# Patient Record
Sex: Female | Born: 1940 | Race: White | Hispanic: No | Smoking: Former smoker
Health system: Southern US, Community
[De-identification: ages and names within clinical notes are randomized; demographics above are authoritative.]

## PROBLEM LIST (undated history)

## (undated) DIAGNOSIS — I4891 Unspecified atrial fibrillation: Secondary | ICD-10-CM

## (undated) DIAGNOSIS — E785 Hyperlipidemia, unspecified: Secondary | ICD-10-CM

## (undated) DIAGNOSIS — C519 Malignant neoplasm of vulva, unspecified: Secondary | ICD-10-CM

## (undated) DIAGNOSIS — F32A Depression, unspecified: Secondary | ICD-10-CM

## (undated) DIAGNOSIS — K5792 Diverticulitis of intestine, part unspecified, without perforation or abscess without bleeding: Secondary | ICD-10-CM

## (undated) DIAGNOSIS — I10 Essential (primary) hypertension: Secondary | ICD-10-CM

## (undated) DIAGNOSIS — F329 Major depressive disorder, single episode, unspecified: Secondary | ICD-10-CM

## (undated) DIAGNOSIS — C801 Malignant (primary) neoplasm, unspecified: Secondary | ICD-10-CM

## (undated) HISTORY — DX: Major depressive disorder, single episode, unspecified: F32.9

## (undated) HISTORY — PX: TONSILLECTOMY: SUR1361

## (undated) HISTORY — DX: Hyperlipidemia, unspecified: E78.5

## (undated) HISTORY — DX: Malignant neoplasm of vulva, unspecified: C51.9

## (undated) HISTORY — DX: Depression, unspecified: F32.A

## (undated) HISTORY — PX: VULVA SURGERY: SHX837

## (undated) HISTORY — PX: CATARACT EXTRACTION, BILATERAL: SHX1313

## (undated) HISTORY — DX: Unspecified atrial fibrillation: I48.91

## (undated) HISTORY — DX: Malignant (primary) neoplasm, unspecified: C80.1

## (undated) HISTORY — PX: KNEE SURGERY: SHX244

## (undated) HISTORY — PX: ABDOMINAL HYSTERECTOMY: SHX81

## (undated) HISTORY — DX: Essential (primary) hypertension: I10

## (undated) HISTORY — DX: Diverticulitis of intestine, part unspecified, without perforation or abscess without bleeding: K57.92

---

## 2018-08-29 ENCOUNTER — Telehealth: Payer: Self-pay | Admitting: *Deleted

## 2018-08-29 ENCOUNTER — Encounter: Payer: Self-pay | Admitting: Oncology

## 2018-08-29 ENCOUNTER — Encounter (INDEPENDENT_AMBULATORY_CARE_PROVIDER_SITE_OTHER): Payer: Self-pay

## 2018-08-29 ENCOUNTER — Other Ambulatory Visit: Payer: Self-pay

## 2018-08-29 ENCOUNTER — Inpatient Hospital Stay: Payer: Medicare Other | Attending: Oncology | Admitting: Oncology

## 2018-08-29 ENCOUNTER — Inpatient Hospital Stay: Payer: Medicare Other

## 2018-08-29 VITALS — BP 94/54 | HR 60 | Temp 97.6°F | Resp 16 | Ht 62.5 in | Wt 111.2 lb

## 2018-08-29 DIAGNOSIS — N939 Abnormal uterine and vaginal bleeding, unspecified: Secondary | ICD-10-CM

## 2018-08-29 DIAGNOSIS — I4891 Unspecified atrial fibrillation: Secondary | ICD-10-CM

## 2018-08-29 DIAGNOSIS — I480 Paroxysmal atrial fibrillation: Secondary | ICD-10-CM | POA: Insufficient documentation

## 2018-08-29 DIAGNOSIS — Z923 Personal history of irradiation: Secondary | ICD-10-CM | POA: Insufficient documentation

## 2018-08-29 DIAGNOSIS — C519 Malignant neoplasm of vulva, unspecified: Secondary | ICD-10-CM | POA: Diagnosis not present

## 2018-08-29 DIAGNOSIS — K5900 Constipation, unspecified: Secondary | ICD-10-CM | POA: Insufficient documentation

## 2018-08-29 DIAGNOSIS — G893 Neoplasm related pain (acute) (chronic): Secondary | ICD-10-CM | POA: Insufficient documentation

## 2018-08-29 DIAGNOSIS — Z79899 Other long term (current) drug therapy: Secondary | ICD-10-CM | POA: Diagnosis not present

## 2018-08-29 DIAGNOSIS — E785 Hyperlipidemia, unspecified: Secondary | ICD-10-CM | POA: Insufficient documentation

## 2018-08-29 DIAGNOSIS — Z7901 Long term (current) use of anticoagulants: Secondary | ICD-10-CM

## 2018-08-29 DIAGNOSIS — Z87891 Personal history of nicotine dependence: Secondary | ICD-10-CM | POA: Diagnosis not present

## 2018-08-29 DIAGNOSIS — I10 Essential (primary) hypertension: Secondary | ICD-10-CM | POA: Diagnosis not present

## 2018-08-29 DIAGNOSIS — N898 Other specified noninflammatory disorders of vagina: Secondary | ICD-10-CM | POA: Diagnosis not present

## 2018-08-29 LAB — CBC WITH DIFFERENTIAL/PLATELET
BASOS ABS: 0.3 10*3/uL — AB (ref 0–0.1)
BASOS PCT: 3 %
EOS ABS: 0.1 10*3/uL (ref 0–0.7)
EOS PCT: 1 %
HCT: 37.1 % (ref 35.0–47.0)
Hemoglobin: 12.1 g/dL (ref 12.0–16.0)
Lymphocytes Relative: 11 %
Lymphs Abs: 1.1 10*3/uL (ref 1.0–3.6)
MCH: 26.7 pg (ref 26.0–34.0)
MCHC: 32.5 g/dL (ref 32.0–36.0)
MCV: 82.2 fL (ref 80.0–100.0)
Monocytes Absolute: 0.5 10*3/uL (ref 0.2–0.9)
Monocytes Relative: 5 %
NEUTROS PCT: 80 %
Neutro Abs: 8.1 10*3/uL — ABNORMAL HIGH (ref 1.4–6.5)
PLATELETS: 257 10*3/uL (ref 150–440)
RBC: 4.51 MIL/uL (ref 3.80–5.20)
RDW: 15.7 % — ABNORMAL HIGH (ref 11.5–14.5)
WBC: 10.1 10*3/uL (ref 3.6–11.0)

## 2018-08-29 LAB — COMPREHENSIVE METABOLIC PANEL
ALK PHOS: 77 U/L (ref 38–126)
ALT: 14 U/L (ref 0–44)
AST: 21 U/L (ref 15–41)
Albumin: 3.2 g/dL — ABNORMAL LOW (ref 3.5–5.0)
Anion gap: 8 (ref 5–15)
BILIRUBIN TOTAL: 0.5 mg/dL (ref 0.3–1.2)
BUN: 24 mg/dL — ABNORMAL HIGH (ref 8–23)
CALCIUM: 9.4 mg/dL (ref 8.9–10.3)
CHLORIDE: 110 mmol/L (ref 98–111)
CO2: 25 mmol/L (ref 22–32)
CREATININE: 0.91 mg/dL (ref 0.44–1.00)
Glucose, Bld: 130 mg/dL — ABNORMAL HIGH (ref 70–99)
Potassium: 4.2 mmol/L (ref 3.5–5.1)
Sodium: 143 mmol/L (ref 135–145)
TOTAL PROTEIN: 7.4 g/dL (ref 6.5–8.1)

## 2018-08-29 LAB — SAMPLE TO BLOOD BANK

## 2018-08-29 LAB — PROTIME-INR
INR: 4.98
Prothrombin Time: 45.9 seconds — ABNORMAL HIGH (ref 11.4–15.2)

## 2018-08-29 MED ORDER — POLYETHYLENE GLYCOL 3350 17 G PO PACK: 17.0000 g | PACK | Freq: Every day | ORAL | 0 refills | Status: AC | PRN

## 2018-08-29 MED ORDER — TRAMADOL HCL 50 MG PO TABS: 50.0000 mg | ORAL_TABLET | Freq: Four times a day (QID) | ORAL | 0 refills | Status: DC | PRN

## 2018-08-29 MED ORDER — SENNA 8.6 MG PO TABS: 2.0000 | ORAL_TABLET | Freq: Every day | ORAL | 0 refills | Status: DC

## 2018-08-29 MED ORDER — WARFARIN SODIUM 1 MG PO TABS: 1.0000 mg | ORAL_TABLET | Freq: Every day | ORAL | 0 refills | Status: DC

## 2018-08-29 NOTE — Progress Notes (Signed)
Patient here today as a new patient with vulvar cancer.  Patient c/o sores around the vulvar, urinary inconstancy and constipation. Patient is very uncomfortable sitting in the exam room on a pillow in the chair.

## 2018-08-29 NOTE — Telephone Encounter (Signed)
CRITICAL INR:  4.98  Notified Dr Tasia Catchings. Contacted pt to see if she has taken today, Per patient she has taken today   Per Dr Tasia Catchings, patient is to hold coumadin tomorrow and Sunday, take 2mg  on Monday, lab on Tuesday.    Notified patient of directions and scheduled her a lab appt for Tuesday.   New Rx for 1mg  coumadin sent to pharmacy.

## 2018-08-29 NOTE — Progress Notes (Signed)
Hematology/Oncology Consult note [facility] [phone] [phone]   Patient Care Team: Patient, No Pcp Per as PCP - General (General Practice)  REFERRING PROVIDER: [place] [facility].  CHIEF COMPLAINTS/REASON FOR VISIT:  Evaluation of vulvar cancer  HISTORY OF PRESENTING ILLNESS:  Virginia Murray is a  77 y.o.  female with PMH listed below who was referred to me for evaluation of vulvar cancer.  Patient recently relocated from [place] to [place]. Reviewed medical records sent to [place] from [place] cancer in the blood specialist Dr. Marney Doctor, who is patient's previous oncologist. 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  She completed radiation to vulva and the right groin in August 2007 at [facility].  Since then she was on surveillance.  She was advised to have follow-up but she had to postpone as she was taking care of her husband was quite ill and unfortunately passed away On 2018/08/13, patient was found to have a 7 cm ulcerated lesion in the left vulva partially involving the vaginal and rectum on examination.  The lesion was biopsied by Dr. Tessa Lerner on 07/29/2018, PET scan showed ulcerated perineal mass 8.7 x 4.2 x 5.3 centimeters, SUV 12, extending into the region of gluteal cleft.  Left inguinal lymph nodes measured [1.1 x 0.7 cm]  SUV 1.6.  Which appeared nonspecific possibly metastatic or reactive. There was no evidence of distant metastasis. Given prior radiation treatment, she was advised by Dr. Tessa Lerner for chemotherapy.  She met Dr.Christi Maudie Mercury for discussion of treatment options.  She was recommended to start dose reduced carboplatin and Taxol.  Also NexGen sequencing PDL 1 testing was discussed, unknown if testing were sent.  Patient did not start treatment as she is  relocating to [place] to live with patient's son and wants to establish care with [facility] cancer center.  Today patient reports moderate discomfort and pain in the vulvar area.  She also has some vaginal discharge and wear pads. Reports bleeding from her private area.  Denies any symptoms of voiding trouble, dysuria or incontinence. Reports constipation.  Past medical history includes paroxysmal atrial fibrillation and take coumadin 67m daily. She has not established care with PCP locally. No INR has been checked recently.   She had labs [facility]. BUN 21.3, creatinine 1.81, sodium 143, potassium 4, chloride 103, calcium 9.4, total protein 6.9, albumin 3.4, alkaline phosphatase 99 total bili 8.3, ALT 13 AST 18, iron 36, TIBC 241, iron saturation 14.9, magnesium 1.5, phosphorus 3.3, LDH 166, uric acid 5.8.  Folate 18.9, ferritin 84, B12 362, CEA 1.6. WBC 9.1, hemoglobin 12.8, hematocrit 40.5, MCV 83.9, platelet counts 200,000, normal differential except slightly high monocyte 0.7[normal ref0.1- 0.6]  Review of Systems  Constitutional: Negative for chills, fever, malaise/fatigue and weight loss.  HENT: Negative for nosebleeds and sore throat.   Eyes: Negative for double vision, photophobia and redness.  Respiratory: Negative for cough, shortness of breath and wheezing.   Cardiovascular: Negative for chest pain, palpitations and orthopnea.  Gastrointestinal: Positive for constipation. Negative for abdominal pain, blood in stool, nausea and vomiting.  Genitourinary: Negative for dysuria.       Soreness/pain around Vulva mass, spotting blood.  Musculoskeletal: Negative for back pain, myalgias and neck pain.  Skin: Negative for itching and rash.  Neurological: Negative for dizziness, tingling and tremors.  Endo/Heme/Allergies: Negative for environmental allergies. Does not bruise/bleed easily.  Psychiatric/Behavioral: Negative for depression.     MEDICAL HISTORY:  Past Medical History:  Diagnosis Date  . A-fib ([place])   . Cancer (HCC)    vulva   . Depression   . Diverticulitis   . Hyperlipemia   . Hypertension   . Vulva cancer [facility])     SURGICAL HISTORY: Past Surgical History:  Procedure Laterality Date  . ABDOMINAL HYSTERECTOMY    . CATARACT EXTRACTION, BILATERAL    . KNEE SURGERY     right knee   . TONSILLECTOMY    . VULVA SURGERY      SOCIAL HISTORY: Social History   Socioeconomic History  . Marital status: Widowed    Spouse name: Not on file  . Number of children: 2  . Years of education: Not on file  . Highest education level: Not on file  Occupational History  . Not on file  Social Needs  . Financial resource strain: Not on file  . Food insecurity:    Worry: Not on file    Inability: Not on file  . Transportation needs:    Medical: Not on file    Non-medical: Not on file  Tobacco Use  . Smoking status: Former Smoker    Packs/day: 1.00    Years: 20.00    Pack years: 20.00    Types: Cigarettes    Last attempt to quit: 08/29/1993    Years since quitting: 25.0  . Smokeless tobacco: Never Used  Substance and Sexual Activity  . Alcohol use: Not Currently  . Drug use: Never  . Sexual activity: Not Currently  Lifestyle  . Physical activity:    Days per week: Not on file    Minutes per session: Not on file  . Stress: Not on file  Relationships  . Social connections:    Talks on phone: Not on file    Gets together: Not on file    Attends religious service: Not on file    Active member of club or organization: Not on file    Attends meetings of clubs or organizations: Not on file    Relationship status: Not on file  . Intimate partner violence:    Fear of current or ex partner: Not on file    Emotionally abused: Not on file    Physically abused: Not on file    Forced sexual activity: Not on file  Other Topics Concern  . Not on file  Social History Narrative  . Not on file    FAMILY  HISTORY: Family History  Problem Relation Age of Onset  . Alzheimer's disease Mother   . Heart disease Father     ALLERGIES:  has No Known Allergies.  MEDICATIONS:  Current Outpatient Medications  Medication Sig Dispense Refill  . amLODipine (NORVASC) 2.5 MG tablet Take 2.5 mg by mouth daily.    Marland Kitchen atenolol (TENORMIN) 100 MG tablet Take 100 mg by mouth daily.    . benazepril (LOTENSIN) 40 MG tablet Take 40 mg by mouth daily.    Marland Kitchen doxazosin (CARDURA) 8 MG tablet Take 8 mg by mouth daily.    . pravastatin (PRAVACHOL) 40 MG tablet Take 40 mg by mouth daily.    Marland Kitchen warfarin (COUMADIN) 3 MG tablet Take 3 mg by mouth daily.     No current facility-administered medications for this visit.      PHYSICAL EXAMINATION: ECOG PERFORMANCE STATUS: 2 - Symptomatic, <50% confined to bed  Vitals:   08/29/18 1145  BP: (!) 94/54  Pulse: 60  Resp: 16  Temp: 97.6 F (36.4 C)   Filed Weights   08/29/18 1145  Weight: 111 lb 4 oz (50.5 kg)    Physical Exam  Constitutional: She is oriented to person, place, and time.  Frail appearance elderly female, mild distress sitting on pillow  HENT:  Head: Normocephalic and atraumatic.  Mouth/Throat: Oropharynx is clear and moist.  Eyes: Pupils are equal, round, and reactive to light. EOM are normal. No scleral icterus.  Neck: Normal range of motion. Neck supple.  Cardiovascular: Normal rate.  Pulmonary/Chest: Effort normal. No respiratory distress. She has no wheezes.  Abdominal: Soft. Bowel sounds are normal. She exhibits no distension.  Genitourinary: Vaginal discharge found.  Genitourinary Comments: Bloody vaginal discharge. Left vulvar ulcerated mass extending to the angina, foul-smelling odor  Musculoskeletal: Normal range of motion. She exhibits no edema or deformity.  Neurological: She is alert and oriented to person, place, and time. No cranial nerve deficit. Coordination normal.  Skin: Skin is warm and dry. No rash noted.  Psychiatric: She  has a normal mood and affect.     LABORATORY DATA:  I have reviewed the data as listed Lab Results  Component Value Date   WBC 10.1 08/29/2018   HGB 12.1 08/29/2018   HCT 37.1 08/29/2018   MCV 82.2 08/29/2018   PLT 257 08/29/2018   No results for input(s): NA, K, CL, CO2, GLUCOSE, BUN, CREATININE, CALCIUM, GFRNONAA, GFRAA, PROT, ALBUMIN, AST, ALT, ALKPHOS, BILITOT, BILIDIR, IBILI in the last 8760 hours. Iron/TIBC/Ferritin/ %Sat No results found for: IRON, TIBC, FERRITIN, IRONPCTSAT      ASSESSMENT & PLAN:  1. Vulvar cancer ([place])   2. Chronic anticoagulation   3. Atrial fibrillation, unspecified type ([place])   4. Vaginal discharge, bloody    The diagnosis and care plan were discussed with patient in detail.   Will obtain PET scan image, pathology slides to be reviewed by our pathologist.  Recommend systemic Carboplatin and Taxol, will dose reduce given patient's age and performance status.  We discussed with patient and her family members and they understand that it may take 1 to 2 weeks before we start chemotherapy.  We will start supportive care to decrease her cancer symptoms.  The goal of treatment which is to palliate disease, disease related symptoms, improve quality of life and hopefully prolong life was highlighted in our discussion.  We had discussed the composition of chemotherapy regimen, length of chemo cycle, duration of treatment and the time to assess response to treatment.  Supportive care measures are necessary for patient well-being and will be provided as necessary.  I explained to the patient the risks and benefits of chemotherapy including all but not limited to hair loss, mouth sore, nausea, vomiting, low blood counts, bleeding, neuropathy and risk of life threatening infection and even death, secondary malignancy etc.  Patient voices understanding and willing to proceed chemotherapy.   # Chemotherapy education, refer to vascular surgery for medi port placement.  Hopefully the planned start chemotherapy next week. Antiemetics-Zofran and Compazine; EMLA cream sent to pharmacy   #Atrial fibrillation on chronic anticoagulation with Coumadin.  Reports that she has been on Coumadin for about a year. She follows up with cardiology in [place] has not establish local care.  We will check INR today. Refer to cardiology to establish care and evaluation of if she needs chronic anticoagulation or if it is okay to be switched to aspirin  given that she may become thrombocytopenia and have increased bleeding risk.  #Referred to primary care physician to establish care. #Neoplasm related pain: Patient has tried topical palliative care without symptom relief.  Will start patient on tramadol 50 mg every 6 hours as needed.  Pain regimen can be further titrated up according to her pain control. #Refer to GYN oncology to establish care.  #Constipation, she takes Colace OTC without symptom relief.  Advised patient to stop Colace.  I sent prescription of senna 17.6 mg daily with as needed MiraLAX.   We spent sufficient time to discuss many aspect of care, questions were answered to patient's satisfaction.  Orders Placed This Encounter  Procedures  . Protime-INR    Standing Status:   Future    Standing Expiration Date:   08/30/2019  . CBC with Differential/Platelet    Standing Status:   Future    Standing Expiration Date:   08/30/2019  . Comprehensive metabolic panel    Standing Status:   Future    Standing Expiration Date:   08/30/2019    All questions were answered. The patient knows to call the clinic with any problems questions or concerns.  Return of visit: 1 week for assessment of symptom controls and discuss future plan.  Thank you for this kind referral and the opportunity to participate in the care of this patient. A copy of today's note is routed to referring provider  Total face to face encounter time for this patient visit was 60 min. >50% of the time was   spent in counseling and coordination of care.    Earlie Server, MD, PhD Hematology Oncology [facility] at [facility] Pager- [phone] 08/29/2018

## 2018-09-02 ENCOUNTER — Inpatient Hospital Stay: Payer: Medicare Other | Attending: Oncology

## 2018-09-02 ENCOUNTER — Other Ambulatory Visit: Payer: Self-pay

## 2018-09-02 ENCOUNTER — Telehealth: Payer: Self-pay | Admitting: *Deleted

## 2018-09-02 DIAGNOSIS — C519 Malignant neoplasm of vulva, unspecified: Secondary | ICD-10-CM | POA: Insufficient documentation

## 2018-09-02 DIAGNOSIS — Z923 Personal history of irradiation: Secondary | ICD-10-CM | POA: Diagnosis not present

## 2018-09-02 DIAGNOSIS — Z90710 Acquired absence of both cervix and uterus: Secondary | ICD-10-CM | POA: Diagnosis not present

## 2018-09-02 DIAGNOSIS — Z87891 Personal history of nicotine dependence: Secondary | ICD-10-CM | POA: Diagnosis not present

## 2018-09-02 DIAGNOSIS — E8809 Other disorders of plasma-protein metabolism, not elsewhere classified: Secondary | ICD-10-CM | POA: Insufficient documentation

## 2018-09-02 DIAGNOSIS — Z7901 Long term (current) use of anticoagulants: Secondary | ICD-10-CM

## 2018-09-02 LAB — PROTIME-INR
INR: 1.82
PROTHROMBIN TIME: 20.9 s — AB (ref 11.4–15.2)

## 2018-09-02 NOTE — Telephone Encounter (Signed)
Per Dr Tasia Catchings, based off of INR result today, advise patient to continue 2 mg coumadin, we will see patient tomorrow in clinic as already scheduled.    Called daughter in law, Sharyn Lull, notify her of INR results and to continue 2mg ,  Sharyn Lull verbalized understanding

## 2018-09-03 ENCOUNTER — Inpatient Hospital Stay (HOSPITAL_BASED_OUTPATIENT_CLINIC_OR_DEPARTMENT_OTHER): Payer: Medicare Other | Admitting: Oncology

## 2018-09-03 ENCOUNTER — Telehealth (INDEPENDENT_AMBULATORY_CARE_PROVIDER_SITE_OTHER): Payer: Self-pay

## 2018-09-03 ENCOUNTER — Other Ambulatory Visit: Payer: Medicare Other

## 2018-09-03 ENCOUNTER — Other Ambulatory Visit (INDEPENDENT_AMBULATORY_CARE_PROVIDER_SITE_OTHER): Payer: Self-pay | Admitting: Nurse Practitioner

## 2018-09-03 ENCOUNTER — Other Ambulatory Visit: Payer: Self-pay

## 2018-09-03 ENCOUNTER — Encounter: Payer: Self-pay | Admitting: Oncology

## 2018-09-03 ENCOUNTER — Ambulatory Visit: Payer: Medicare Other

## 2018-09-03 ENCOUNTER — Inpatient Hospital Stay (HOSPITAL_BASED_OUTPATIENT_CLINIC_OR_DEPARTMENT_OTHER): Payer: Medicare Other | Admitting: Obstetrics and Gynecology

## 2018-09-03 VITALS — BP 127/66 | HR 52 | Temp 98.2°F | Wt 109.0 lb

## 2018-09-03 VITALS — BP 127/66 | HR 52 | Temp 98.2°F | Resp 18 | Ht 62.5 in | Wt 109.0 lb

## 2018-09-03 DIAGNOSIS — K5900 Constipation, unspecified: Secondary | ICD-10-CM

## 2018-09-03 DIAGNOSIS — I4891 Unspecified atrial fibrillation: Secondary | ICD-10-CM

## 2018-09-03 DIAGNOSIS — Z87891 Personal history of nicotine dependence: Secondary | ICD-10-CM | POA: Diagnosis not present

## 2018-09-03 DIAGNOSIS — Z90710 Acquired absence of both cervix and uterus: Secondary | ICD-10-CM | POA: Diagnosis not present

## 2018-09-03 DIAGNOSIS — Z7901 Long term (current) use of anticoagulants: Secondary | ICD-10-CM | POA: Diagnosis not present

## 2018-09-03 DIAGNOSIS — Z923 Personal history of irradiation: Secondary | ICD-10-CM | POA: Diagnosis not present

## 2018-09-03 DIAGNOSIS — N898 Other specified noninflammatory disorders of vagina: Secondary | ICD-10-CM

## 2018-09-03 DIAGNOSIS — K5909 Other constipation: Secondary | ICD-10-CM

## 2018-09-03 DIAGNOSIS — G893 Neoplasm related pain (acute) (chronic): Secondary | ICD-10-CM | POA: Diagnosis not present

## 2018-09-03 DIAGNOSIS — E8809 Other disorders of plasma-protein metabolism, not elsewhere classified: Secondary | ICD-10-CM

## 2018-09-03 DIAGNOSIS — C519 Malignant neoplasm of vulva, unspecified: Secondary | ICD-10-CM | POA: Diagnosis not present

## 2018-09-03 DIAGNOSIS — Z7189 Other specified counseling: Secondary | ICD-10-CM

## 2018-09-03 MED ORDER — OXYCODONE HCL 5 MG PO TABS: 5.0000 mg | ORAL_TABLET | ORAL | 0 refills | Status: DC | PRN

## 2018-09-03 MED ORDER — DEXAMETHASONE 4 MG PO TABS: 8.0000 mg | ORAL_TABLET | Freq: Every day | ORAL | 1 refills | Status: AC

## 2018-09-03 MED ORDER — PROCHLORPERAZINE MALEATE 10 MG PO TABS: 10.0000 mg | ORAL_TABLET | Freq: Four times a day (QID) | ORAL | 1 refills | Status: AC | PRN

## 2018-09-03 MED ORDER — LIDOCAINE-PRILOCAINE 2.5-2.5 % EX CREA: TOPICAL_CREAM | CUTANEOUS | 3 refills | Status: AC

## 2018-09-03 MED ORDER — ONDANSETRON HCL 8 MG PO TABS: 8.0000 mg | ORAL_TABLET | Freq: Two times a day (BID) | ORAL | 1 refills | Status: AC | PRN

## 2018-09-03 NOTE — Progress Notes (Signed)
Gynecologic Oncology Consult Visit   Referring Provider: Earlie Server, MD  Chief Concern: recurrent vulvar cancer  Subjective:  Virginia Murray is a 77 y.o. female who is seen in consultation from Dr. Tasia Catchings for recurrent vulvar cancer. Her gynecologic history is complicated.   She recently relocated from [place] to [place] and saw Dr. Tasia Catchings. Her prior oncologist in [place] was Dr. Marney Doctor.   Per medical records, Virginia Murray was initially diagnosed with vulvar cancer in 2007.   -2007 status post radical vulvectomy, bilateral inguinal lymphadenectomy followed by adjuvant radiation to vulva and the right groin in August 2007 at [facility].    She was on surveillance, but due to family illness it sounds like she was lost to followup. She was taking care of her husband was quite ill and unfortunately passed away.   -Aug 22, 2018, patient was found to have a 7 cm ulcerated lesion in the left vulva partially involving the vaginal and rectum on examination.  The lesion was biopsied by Dr. Tessa Lerner on 07/29/2018,   PET scan showed ulcerated perineal mass 8.7 x 4.2 x 5.3 centimeters, SUV 12, extending into the region of gluteal cleft.  Left inguinal lymph nodes measured [1.1 x 0.7 cm]  SUV 1.6.  Which appeared nonspecific possibly metastatic or reactive. There was no evidence of distant metastasis.  Given prior radiation treatment, she was advised by Dr. Tessa Lerner for chemotherapy.  She met Dr.Christi Maudie Mercury for discussion of treatment options.  She was recommended to start dose reduced carboplatin and Taxol. Patient did not start treatment as she is relocating to [place] to live with patient's son and wants to establish care with [facility].  Tumor testing: NexGen sequencing PDL 1 testing was discussed, unknown if testing were sent.    Today patient reports significant discomfort and pain in her vulvar area rating pain 10/10. She has started Tramadol per Dr. Tasia Catchings which  provides some improvement. She has some vaginal discharge and wears pads. She has some bleeding and constipation. She has pain with bowel movements but reports control of her bowel movements. She reports that she had 'closure' of her vagina after initial radiation treatments. She reports significant discomfort in sitting. Denies voiding problems or dysuria.   Dr. Tasia Catchings checked her PT/INR which was elevated to 45.9/4.98 patient is to hold coumadin tomorrow and Sunday, take 37m on Monday. Repeat lab 20.9/1.833 Dr. YTasia Catchingsis scheduled to see her today.   Problem List: Patient Active Problem List   Diagnosis Date Noted  . [place] cancer [place] 09/03/2018    Past Medical History: Past Medical History:  Diagnosis Date  . A-fib ([place]   . Cancer (HCC)    vulva   . Depression   . Diverticulitis   . Hyperlipemia   . Hypertension   . Vulva cancer ([facility]     Past Surgical History: Past Surgical History:  Procedure Laterality Date  . ABDOMINAL HYSTERECTOMY    . CATARACT EXTRACTION, BILATERAL    . KNEE SURGERY     right knee   . TONSILLECTOMY    . VULVA SURGERY      Past Gynecologic History: as per HPI   OB History:  OB History  Gravida Para Term Preterm AB Living  2 2          SAB TAB Ectopic Multiple Live Births               # Outcome Date GA Lbr Len/2nd Weight Sex Delivery  Anes PTL Lv  2 Para           1 Para             Family History: None Family History  Problem Relation Age of Onset  . Alzheimer's disease Mother   . Heart disease Father     Social History: Social History   Socioeconomic History  . Marital status: Widowed    Spouse name: Not on file  . Number of children: 2  . Years of education: Not on file  . Highest education level: Not on file  Occupational History  . Not on file  Social Needs  . Financial resource strain: Not on file  . Food insecurity:    Worry: Not on file    Inability: Not on file  . Transportation needs:    Medical: Not on file     Non-medical: Not on file  Tobacco Use  . Smoking status: Former Smoker    Packs/day: 1.00    Years: 20.00    Pack years: 20.00    Types: Cigarettes    Last attempt to quit: 08/29/1993    Years since quitting: 25.0  . Smokeless tobacco: Never Used  Substance and Sexual Activity  . Alcohol use: Not Currently  . Drug use: Never  . Sexual activity: Not Currently  Lifestyle  . Physical activity:    Days per week: Not on file    Minutes per session: Not on file  . Stress: Not on file  Relationships  . Social connections:    Talks on phone: Not on file    Gets together: Not on file    Attends religious service: Not on file    Active member of club or organization: Not on file    Attends meetings of clubs or organizations: Not on file    Relationship status: Not on file  . Intimate partner violence:    Fear of current or ex partner: Not on file    Emotionally abused: Not on file    Physically abused: Not on file    Forced sexual activity: Not on file  Other Topics Concern  . Not on file  Social History Narrative  . Not on file    Allergies: No Known Allergies  Current Medications: Current Outpatient Medications  Medication Sig Dispense Refill  . amLODipine (NORVASC) 2.5 MG tablet Take 2.5 mg by mouth daily.    Marland Kitchen atenolol (TENORMIN) 100 MG tablet Take 100 mg by mouth daily.    . benazepril (LOTENSIN) 40 MG tablet Take 40 mg by mouth daily.    Marland Kitchen doxazosin (CARDURA) 8 MG tablet Take 8 mg by mouth daily.    . pravastatin (PRAVACHOL) 40 MG tablet Take 40 mg by mouth daily.    Marland Kitchen warfarin (COUMADIN) 3 MG tablet Take 2 mg by mouth daily.     . polyethylene glycol (MIRALAX) packet Take 17 g by mouth daily as needed for mild constipation or moderate constipation. (Patient not taking: Reported on 09/03/2018) 30 each 0  . senna (SENOKOT) 8.6 MG TABS tablet Take 2 tablets (17.2 mg total) by mouth daily. (Patient not taking: Reported on 09/03/2018) 120 each 0  . traMADol (ULTRAM) 50 MG  tablet Take 1 tablet (50 mg total) by mouth every 6 (six) hours as needed. (Patient not taking: Reported on 09/03/2018) 30 tablet 0  . warfarin (COUMADIN) 1 MG tablet Take 1 tablet (1 mg total) by mouth daily. (Patient not taking: Reported on 09/03/2018) 30  tablet 0   No current facility-administered medications for this visit.     Review of Systems General: no complaints  HEENT: ringing in the ears  Lungs: no complaints  Cardiac: no complaints  GI: no complaints  GU: vulvar pain and difficult sitting; urinary complaints and requires pads; she is able to have bowel movements.   Musculoskeletal: no complaints  Extremities: no complaints  Skin: no complaints  Neuro: no complaints  Endocrine: no complaints  Psych: no complaints       Objective:  Physical Examination:  BP 127/66 (BP Location: Right Arm, Patient Position: Sitting)   Pulse (!) 52   Temp 98.2 F (36.8 C) (Tympanic)   Resp 18   Ht 5' 2.5" (1.588 m)   Wt 109 lb (49.4 kg)   BMI 19.62 kg/m    ECOG Performance Status: 3 - Symptomatic, >50% confined to bed  General appearance: appears older than stated age, cachectic and no distress. Patient came into clinic in a wheel chair HEENT:PERRLA and extra ocular movement intact Lymph node survey: non-palpable, axillary, left inguinal, supraclavicular. Shotty adenopathy on the right, but no definitely enlarged nodes. Cardiovascular: irregular rate and rhythm Respiratory: normal air entry, lungs clear to auscultation Abdomen: soft, non-tender, without masses or organomegaly, no hernias and well healed incision Back: inspection of back is normal Extremities: extremities normal, atraumatic, no cyanosis or edema Neurological exam reveals alert, oriented, normal speech, no focal findings or movement disorder noted.  Pelvic: exam chaperoned by nurse;  Vulva: large vulvar lesion and ulceration in the perineum extensive with bilateral vulvar involvement. No recognizable anatomy. Exam  deferred to to pain.      Labs:    Chemistry      Component Value Date/Time   NA 143 08/29/2018 1253   K 4.2 08/29/2018 1253   CL 110 08/29/2018 1253   CO2 25 08/29/2018 1253   BUN 24 (H) 08/29/2018 1253   CREATININE 0.91 08/29/2018 1253      Component Value Date/Time   CALCIUM 9.4 08/29/2018 1253   ALKPHOS 77 08/29/2018 1253   AST 21 08/29/2018 1253   ALT 14 08/29/2018 1253   BILITOT 0.5 08/29/2018 1253      Albumin 3.2  Lab Results  Component Value Date   WBC 10.1 08/29/2018   HGB 12.1 08/29/2018   HCT 37.1 08/29/2018   MCV 82.2 08/29/2018   PLT 257 08/29/2018     Radiologic Imaging: As per HPI    Assessment:  Lennyn Gange is a 77 y.o. female diagnosed with recurrent  vulvar cancer s/p radical vulvectomy and bilateral inguinal lymphadenectomy followed by adjuvant radiation to vulva and the right groin in August 2007. Local recurrence with involvement of the vagina and rectum 2019.   Suboptimal pain control.   Poor performance status.   Hypoalbuminemia.   Medical co-morbidities complicating care: A-fib, hyperlipidemia, diverticulitis, diabetes and prior abdominal surgery (s/p hysterectomy) Body mass index is 19.62 kg/m.     Plan:   Problem List Items Addressed This Visit      Other   Vulvar cancer ([place]) - Primary      We discussed options for management. Unfortunately I do not think surgery is an option due to her performance status and I am concerned exenteration would be palliative only. I agree with plan for   Chemotherapy and she is scheduled to see Dr. Tasia Catchings to discuss platinum/paclitaxel chemotherapy. I do not think bevacizumab would be a optimal plan given bowel involvement and high risk for  fistulation. We also discussed pembrolizumab if she has MSI-H, dMMR, or PD-L1+ tumor.   I recommended continued good hygiene practice, optimization of pain control, and symptom management.   She is aware that prognosis is poor and treatment is not  curative. I defer goals of care discussion to Dr. Tasia Catchings.   Suggested return to clinic in  3-6 months or as needed.    The patient's diagnosis, an outline of the further diagnostic and laboratory studies which will be required, the recommendation, and alternatives were discussed.  All questions were answered to the patient's satisfaction.  A total of 60 minutes were spent with the patient/family today; >50% was spent in education, counseling and coordination of care for recurrent  vulvar cancer.    Gillis Ends, MD    CC:  Referring Provider: Earlie Server, MD

## 2018-09-03 NOTE — Progress Notes (Signed)
Patient c/o bleeding Friday / Monday. At this time bleeding has resolved. No pain noted with bleeding. Pain with sitting ( 10 +)

## 2018-09-03 NOTE — Telephone Encounter (Signed)
An attempt has been made to contact the patient yesterday and today to give her information regarding her port insertion scheduled for 09/04/18. Messages have been left for a return call.

## 2018-09-03 NOTE — Progress Notes (Signed)
START OFF PATHWAY REGIMEN - Other Dx   [id number]:Carboplatin + Paclitaxel (5/200) q21d:   A cycle is every 21 days:     Paclitaxel      Carboplatin   **Always confirm dose/schedule in your pharmacy ordering system**  Patient Characteristics: Intent of Therapy: Non-Curative / Palliative Intent, Discussed with Patient 

## 2018-09-04 ENCOUNTER — Ambulatory Visit
Admission: RE | Admit: 2018-09-04 | Discharge: 2018-09-04 | Disposition: A | Discharge status: Home / Self Care | Payer: Medicare Other | Source: Ambulatory Visit | Attending: Vascular Surgery | Admitting: Vascular Surgery

## 2018-09-04 ENCOUNTER — Encounter
Admission: RE | Disposition: A | Discharge status: Home / Self Care | Payer: Self-pay | Source: Ambulatory Visit | Attending: Vascular Surgery

## 2018-09-04 DIAGNOSIS — C519 Malignant neoplasm of vulva, unspecified: Secondary | ICD-10-CM

## 2018-09-04 HISTORY — PX: PORTA CATH INSERTION: CATH118285

## 2018-09-04 SURGERY — PORTA CATH INSERTION
Anesthesia: Moderate Sedation

## 2018-09-04 MED ORDER — CEFAZOLIN SODIUM-DEXTROSE 2-4 GM/100ML-% IV SOLN
2.0000 g | Freq: Once | INTRAVENOUS | Status: DC
  Filled 2018-09-04: qty 100

## 2018-09-04 MED ORDER — SODIUM CHLORIDE 0.9 % IV SOLN
INTRAVENOUS | Status: DC
  Administered 2018-09-04: 14:00:00 via INTRAVENOUS

## 2018-09-04 MED ORDER — SODIUM CHLORIDE 0.9 % IV SOLN
Freq: Once | INTRAVENOUS | Status: AC
  Administered 2018-09-04: 15:00:00
  Filled 2018-09-04: qty 80

## 2018-09-04 MED ORDER — HEPARIN (PORCINE) IN NACL 1000-0.9 UT/500ML-% IV SOLN
INTRAVENOUS | Status: AC
  Filled 2018-09-04: qty 500

## 2018-09-04 MED ORDER — FENTANYL CITRATE (PF) 100 MCG/2ML IJ SOLN
INTRAMUSCULAR | Status: AC
  Filled 2018-09-04: qty 2

## 2018-09-04 MED ORDER — HYDROMORPHONE HCL 1 MG/ML IJ SOLN: 1.0000 mg | Freq: Once | INTRAMUSCULAR | Status: DC | PRN

## 2018-09-04 MED ORDER — LIDOCAINE-EPINEPHRINE (PF) 1 %-1:200000 IJ SOLN
INTRAMUSCULAR | Status: AC
  Filled 2018-09-04: qty 30

## 2018-09-04 MED ORDER — MIDAZOLAM HCL 5 MG/5ML IJ SOLN
INTRAMUSCULAR | Status: AC
  Filled 2018-09-04: qty 5

## 2018-09-04 MED ORDER — DEXTROSE 5 % IV SOLN
2.0000 g | Freq: Once | INTRAVENOUS | Status: DC
  Administered 2018-09-04: 2 g via INTRAVENOUS
  Filled 2018-09-04: qty 20

## 2018-09-04 MED ORDER — MIDAZOLAM HCL 2 MG/2ML IJ SOLN
INTRAMUSCULAR | Status: DC | PRN
  Administered 2018-09-04: 2 mg via INTRAVENOUS

## 2018-09-04 MED ORDER — ONDANSETRON HCL 4 MG/2ML IJ SOLN: 4.0000 mg | Freq: Four times a day (QID) | INTRAMUSCULAR | Status: DC | PRN

## 2018-09-04 MED ORDER — FENTANYL CITRATE (PF) 100 MCG/2ML IJ SOLN
INTRAMUSCULAR | Status: DC | PRN
  Administered 2018-09-04: 50 ug via INTRAVENOUS

## 2018-09-04 SURGICAL SUPPLY — 8 items
KIT PORT POWER 8FR ISP CVUE (Port) ×3 IMPLANT
PACK ANGIOGRAPHY (CUSTOM PROCEDURE TRAY) ×3 IMPLANT
PAD GROUND ADULT SPLIT (MISCELLANEOUS) ×3 IMPLANT
PENCIL ELECTRO HAND CTR (MISCELLANEOUS) ×3 IMPLANT
SUT MNCRL AB 4-0 PS2 18 (SUTURE) ×3 IMPLANT
SUT PROLENE 0 CT 1 30 (SUTURE) ×3 IMPLANT
SUT VIC AB 3-0 SH 27 (SUTURE) ×2
SUT VIC AB 3-0 SH 27X BRD (SUTURE) ×1 IMPLANT

## 2018-09-04 NOTE — Discharge Instructions (Signed)
Moderate Conscious Sedation, Adult, Care After °These instructions provide you with information about caring for yourself after your procedure. Your health care provider may also give you more specific instructions. Your treatment has been planned according to current medical practices, but problems sometimes occur. Call your health care provider if you have any problems or questions after your procedure. °What can I expect after the procedure? °After your procedure, it is common: °· To feel sleepy for several hours. °· To feel clumsy and have poor balance for several hours. °· To have poor judgment for several hours. °· To vomit if you eat too soon. ° °Follow these instructions at home: °For at least 24 hours after the procedure: ° °· Do not: °? Participate in activities where you could fall or become injured. °? Drive. °? Use heavy machinery. °? Drink alcohol. °? Take sleeping pills or medicines that cause drowsiness. °? Make important decisions or sign legal documents. °? Take care of children on your own. °· Rest. °Eating and drinking °· Follow the diet recommended by your health care provider. °· If you vomit: °? Drink water, juice, or soup when you can drink without vomiting. °? Make sure you have little or no nausea before eating solid foods. °General instructions °· Have a responsible adult stay with you until you are awake and alert. °· Take over-the-counter and prescription medicines only as told by your health care provider. °· If you smoke, do not smoke without supervision. °· Keep all follow-up visits as told by your health care provider. This is important. °Contact a health care provider if: °· You keep feeling nauseous or you keep vomiting. °· You feel light-headed. °· You develop a rash. °· You have a fever. °Get help right away if: °· You have trouble breathing. °This information is not intended to replace advice given to you by your health care provider. Make sure you discuss any questions you have  with your health care provider. °Document Released: 10/07/2013 Document Revised: 05/21/2016 Document Reviewed: 04/07/2016 °[employer] Interactive Patient Education © 2018 [employer] °Tunneled Catheter Insertion, Care After °Refer to this sheet in the next few weeks. These instructions provide you with information about caring for yourself after your procedure. Your health care provider may also give you more specific instructions. Your treatment has been planned according to current medical practices, but problems sometimes occur. Call your health care provider if you have any problems or questions after your procedure. °What can I expect after the procedure? °After the procedure, it is common to have: °· Some mild redness, swelling, and pain around your catheter site. °· A small amount of blood or clear fluid coming from your incisions. ° °Follow these instructions at home: °Incision care °· Check your incision areas every day for signs of infection. Check for: °? More redness, swelling, or pain. °? More fluid or blood. °? Warmth. °? Pus or a bad smell. °· Follow instructions from your health care provider about how to take care of your incisions. Make sure you: °? Wash your hands with soap and water before you change your bandages (dressings). If soap and water are not available, use hand sanitizer. °? Change your dressings as told by your health care provider. Wash the area around your incisions with a germ-killing (antiseptic) solution when you change your dressing, as told by your health care provider. °? Leave stitches (sutures), skin glue, or adhesive strips in place. These skin closures may need to stay in place for 2 weeks or   longer. If adhesive strip edges start to loosen and curl up, you may trim the loose edges. Do not remove adhesive strips completely unless your health care provider tells you to do that. °Catheter Care ° °· Wash your hands with soap and water before and after caring for your catheter.  If soap and water are not available, use hand sanitizer. °· Keep your catheter site and your dressings clean and dry. °· Apply an antibiotic ointment to your catheter site as told by your health care provider. °· Flush your catheter as told by your health care provider. This helps prevent it from becoming clogged. °· Do not open the caps on the ends of the catheter. °· Do not pull on your catheter. °· If your catheter is in your arm: °? Avoid wearing tight clothes or tight jewelry on your arm that has the catheter. °? Do not sleep with your head on the arm that has the catheter. °? Do not allow your blood pressure to be taken on the arm that has the catheter. °? Do not allow your blood to be drawn from the arm that has the catheter, except through the catheter itself. °Medicines °· Take over-the-counter and prescription medicines only as told by your health care provider. °· If you were prescribed an antibiotic medicine, take it as told by your health care provider. Do not stop taking the antibiotic even if you start to feel better. °Activity °· Return to your normal activities as told by your health care provider. Ask your health care provider what activities are safe for you. °· Do not lift anything that is heavier than 10 lb (4.5 kg) for 3 weeks or as long as told by your health care provider. °Driving °· Do not drive until your health care provider approves. °· Do not drive or operate heavy machinery while taking prescription pain medicine. °General instructions °· Follow your health care provider's specific instructions for the type of catheter that you have. °· Do not take baths, swim, or use a hot tub until your health care provider approves. °· Follow instructions from your health care provider about eating or drinking restrictions. °· Wear compression stockings as told by your health care provider. These stockings help to prevent blood clots and reduce swelling in your legs. °· Keep all follow-up visits as  told by your health care provider. This is important. °Contact a health care provider if: °· You have more fluid or blood coming from your incisions. °· You have more redness, swelling, or pain at your incisions or around the area where your catheter is inserted. °· Your incisions feel warm to the touch. °· You feel unusually weak. °· You feel nauseous. °· Your catheter is not working properly. °· You have blood or fluid draining from your catheter. °· You are unable to flush your catheter. °Get help right away if: °· Your catheter breaks. °· A hole develops in your catheter. °· Your catheter comes loose or gets pulled completely out. If this happens, press on your catheter site firmly with your hand or a clean cloth until you get medical help. °· Your catheter becomes blocked. °· You have swelling in your arm, shoulder, neck, or face. °· You develop chest pain. °· You have difficulty breathing. °· You feel dizzy or light-headed. °· You have pus or a bad smell coming from your incisions. °· You have a fever. °· You develop bleeding from your catheter or your insertion site, and your bleeding does not   stop. °This information is not intended to replace advice given to you by your health care provider. Make sure you discuss any questions you have with your health care provider. °Document Released: 12/03/2012 Document Revised: 08/19/2016 Document Reviewed: 09/12/2015 °[employer]vier Interactive Patient Education © 2018 [employer] ° °

## 2018-09-04 NOTE — Patient Instructions (Signed)

## 2018-09-04 NOTE — H&P (Signed)
[facility] History & Physical Update  The patient was interviewed and re-examined.  The patient's previous History and Physical has been reviewed and is unchanged.  There is no change in the plan of care. We plan to proceed with the scheduled procedure.  Leotis Pain, MD  09/04/2018, 2:07 PM

## 2018-09-04 NOTE — Progress Notes (Signed)
Patient post port placement per Dr Lucky Cowboy, out to speak with patient and family post procedure with questions answered. Denies complaints at this time. Sinus rhythm per monitor,discharge instructions given with questions answered.

## 2018-09-04 NOTE — Progress Notes (Signed)
Pathology slides requested from [facility]. CD of PET requested from [place]. Oncology Nurse Navigator Documentation  Navigator Location: [facility] (09/04/18 1100)   )Navigator Encounter Type: Telephone;Letter/Fax/Email;Diagnostic Results (09/04/18 1100)                                                    Time Spent with Patient: 60 (09/04/18 1100)

## 2018-09-04 NOTE — Progress Notes (Signed)
Hematology/Oncology Consult note [facility] Telephone:([phone] [phone]   Patient Care Team: Patient, No Pcp Per as PCP - General (General Practice)  REFERRING PROVIDER: Falcon Murray and Blood Specialist.  CHIEF COMPLAINTS/REASON FOR VISIT:  Evaluation of vulvar cancer  HISTORY OF PRESENTING ILLNESS:  Virginia Murray is a  77 y.o.  female with PMH listed below who was referred to me for evaluation of vulvar cancer.  Patient recently relocated from [place] to [place]. Reviewed medical records sent to [place] from [place] cancer in the blood specialist Dr. Marney Doctor, who is patient's previous oncologist. 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  She completed radiation to vulva and the right groin in August 2007 at [facility].  Since then she was on surveillance.  She was advised to have follow-up but she had to postpone as she was taking care of her husband was quite ill and unfortunately passed away On 08-01-18, patient was found to have a 7 cm ulcerated lesion in the left vulva partially involving the vaginal and rectum on examination.  The lesion was biopsied by Dr. Tessa Lerner on 07/29/2018, PET scan showed ulcerated perineal mass 8.7 x 4.2 x 5.3 centimeters, SUV 12, extending into the region of gluteal cleft.  Left inguinal lymph nodes measured [1.1 x 0.7 cm]  SUV 1.6.  Which appeared nonspecific possibly metastatic or reactive. There was no evidence of distant metastasis. Given prior radiation treatment, she was advised by Dr. Tessa Lerner for chemotherapy.  She met Dr.Christi Maudie Mercury for discussion of treatment options.  She was recommended to start dose reduced carboplatin and Taxol.  Also Nex[employer] sequencing PDL 1 testing was discussed, unknown if testing were sent.  Patient did not start  treatment as she is relocating to [place] to live with patient's son and wants to establish care with [facility] cancer [facility].  Today patient reports moderate discomfort and pain in the vulvar area.  She also has some vaginal discharge and wear pads. Reports bleeding from her private area.  Denies any symptoms of voiding trouble, dysuria or incontinence. Reports constipation.  Past medical history includes paroxysmal atrial fibrillation and take coumadin 61m daily. She has not established care with PCP locally. No INR has been checked recently.   [facility]. BUN 21.3, creatinine 1.81, sodium 143, potassium 4, chloride 103, calcium 9.4, total protein 6.9, albumin 3.4, alkaline phosphatase 99 total bili 8.3, ALT 13 AST 18, iron 36, TIBC 241, iron saturation 14.9, magnesium 1.5, phosphorus 3.3, LDH 166, uric acid 5.8.  Folate 18.9, ferritin 84, B12 362, CEA 1.6. WBC 9.1, hemoglobin 12.8, hematocrit 40.5, MCV 83.9, platelet counts 200,000, normal differential except slightly high monocyte 0.7[normal ref0.1- 0.6]   INTERVAL HISTORY [name] a 78y.o. female who has above history reviewed by me today presents for follow up visit for management of recurrent vulvur cancer and cancer symptom management. #Neoplasm related pain, reports that tramadol 50 mg did not help with the pain.  She takes 100 mg of tramadol stool without any pain relief.  She cannot sit still. #Constipation, started on senna and MiraLAX.  She has had loose bowel movement after taking senna and MiraLAX.  Since that she stopped taking any bowel regimen.  Last bowel movement was yesterday. #Vaginal bleeding, intermittent, ongoing.  Has not worsened. #Chronic anticoagulation  on Coumadin 3 mg previously.  INR at last visit was supratherapeutic above 4, she was advised to hold Coumadin for 2 days and restarted on 2 mg. Repeat INR was 1.8.  Patient was advised to  continue on Coumadin 2 mg. She takes Coumadin for chronic atrial fibrillation.  Has not establish care with local cardiologist yet.  Refer to cardiology.    Review of Systems  Constitutional: Positive for malaise/fatigue. Negative for chills, fever and weight loss.  HENT: Negative for nosebleeds and sore throat.   Eyes: Negative for double vision, photophobia and redness.  Respiratory: Negative for cough, shortness of breath and wheezing.   Cardiovascular: Negative for chest pain, palpitations and orthopnea.  Gastrointestinal: Negative for abdominal pain, blood in stool, constipation, nausea and vomiting.  Genitourinary: Negative for dysuria.       Soreness/pain around Vulva mass, spotting blood.  Musculoskeletal: Negative for back pain, myalgias and neck pain.  Skin: Negative for itching and rash.  Neurological: Negative for dizziness, tingling and tremors.  Endo/Heme/Allergies: Negative for environmental allergies. Does not bruise/bleed easily.  Psychiatric/Behavioral: Negative for depression.    MEDICAL HISTORY:  Past Medical History:  Diagnosis Date  . A-fib ([place])   . Cancer (HCC)    vulva   . Depression   . Diverticulitis   . Hyperlipemia   . Hypertension   . Vulva cancer [facility])     SURGICAL HISTORY: Past Surgical History:  Procedure Laterality Date  . ABDOMINAL HYSTERECTOMY    . CATARACT EXTRACTION, BILATERAL    . KNEE SURGERY     right knee   . TONSILLECTOMY    . VULVA SURGERY      SOCIAL HISTORY: Social History   Socioeconomic History  . Marital status: Widowed    Spouse name: Not on file  . Number of children: 2  . Years of education: Not on file  . Highest education level: Not on file  Occupational History  . Not on file  Social Needs  . Financial resource strain: Not on file  . Food insecurity:    Worry: Not on file    Inability: Not on file  . Transportation needs:    Medical: Not on file    Non-medical: Not on file  Tobacco Use  . Smoking  status: Former Smoker    Packs/day: 1.00    Years: 20.00    Pack years: 20.00    Types: Cigarettes    Last attempt to quit: 08/29/1993    Years since quitting: 25.0  . Smokeless tobacco: Never Used  Substance and Sexual Activity  . Alcohol use: Not Currently  . Drug use: Never  . Sexual activity: Not Currently  Lifestyle  . Physical activity:    Days per week: Not on file    Minutes per session: Not on file  . Stress: Not on file  Relationships  . Social connections:    Talks on phone: Not on file    Gets together: Not on file    Attends religious service: Not on file    Active member of club or organization: Not on file    Attends meetings of clubs or organizations: Not on file    Relationship status: Not on file  . Intimate partner violence:    Fear of current or ex partner: Not on file    Emotionally abused: Not on file    Physically abused: Not on file    Forced sexual activity: Not on file  Other Topics Concern  .  Not on file  Social History Narrative  . Not on file    FAMILY HISTORY: Family History  Problem Relation Age of Onset  . Alzheimer's disease Mother   . Heart disease Father     ALLERGIES:  has No Known Allergies.  MEDICATIONS:  Current Outpatient Medications  Medication Sig Dispense Refill  . amLODipine (NORVASC) 2.5 MG tablet Take 2.5 mg by mouth daily.    Marland Kitchen atenolol (TENORMIN) 100 MG tablet Take 100 mg by mouth daily.    . benazepril (LOTENSIN) 40 MG tablet Take 40 mg by mouth daily.    Marland Kitchen doxazosin (CARDURA) 8 MG tablet Take 8 mg by mouth daily.    . polyethylene glycol (MIRALAX) packet Take 17 g by mouth daily as needed for mild constipation or moderate constipation. 30 each 0  . pravastatin (PRAVACHOL) 40 MG tablet Take 40 mg by mouth daily.    Marland Kitchen senna (SENOKOT) 8.6 MG TABS tablet Take 2 tablets (17.2 mg total) by mouth daily. 120 each 0  . warfarin (COUMADIN) 1 MG tablet Take 1 tablet (1 mg total) by mouth daily. 30 tablet 0  . dexamethasone  (DECADRON) 4 MG tablet Take 2 tablets (8 mg total) by mouth daily. Start the day after chemotherapy for 2 days. 30 tablet 1  . lidocaine-prilocaine (EMLA) cream Apply to affected area once 30 g 3  . ondansetron (ZOFRAN) 8 MG tablet Take 1 tablet (8 mg total) by mouth 2 (two) times daily as needed for refractory nausea / vomiting. Start on day 3 after chemo. 30 tablet 1  . oxyCODONE (ROXICODONE) 5 MG immediate release tablet Take 1 tablet (5 mg total) by mouth every 4 (four) hours as needed for moderate pain or severe pain. 30 tablet 0  . prochlorperazine (COMPAZINE) 10 MG tablet Take 1 tablet (10 mg total) by mouth every 6 (six) hours as needed (Nausea or vomiting). 30 tablet 1   No current facility-administered medications for this visit.      PHYSICAL EXAMINATION: ECOG PERFORMANCE STATUS: 2 - Symptomatic, <50% confined to bed Vitals:   09/03/18 1539  BP: 127/66  Pulse: (!) 52  Temp: 98.2 F (36.8 C)   Filed Weights   09/03/18 1539  Weight: 109 lb (49.4 kg)    Physical Exam  Constitutional: She is oriented to person, place, and time. She appears well-developed and well-nourished. No distress.  Frail appearance elderly female, mild distress sitting on pillow  HENT:  Head: Normocephalic and atraumatic.  Right Ear: External ear normal.  Left Ear: External ear normal.  Mouth/Throat: Oropharynx is clear and moist.  Eyes: Pupils are equal, round, and reactive to light. EOM are normal. No scleral icterus.  Neck: Normal range of motion. Neck supple.  Cardiovascular: Normal rate, regular rhythm and normal heart sounds.  Pulmonary/Chest: Effort normal. No respiratory distress. She has no wheezes.  Abdominal: Soft. Bowel sounds are normal. She exhibits no distension and no mass. There is no tenderness.  Genitourinary: Vaginal discharge found.  Genitourinary Comments: Bloody vaginal discharge. Left vulvar ulcerated mass extending to the angina, foul-smelling odor  Musculoskeletal: Normal  range of motion. She exhibits no edema or deformity.  Neurological: She is alert and oriented to person, place, and time. No cranial nerve deficit. Coordination normal.  Skin: Skin is warm and dry. No rash noted. No erythema.  Psychiatric: She has a normal mood and affect. Her behavior is normal. Thought content normal.     LABORATORY DATA:  I have reviewed the data  as listed Lab Results  Component Value Date   WBC 10.1 08/29/2018   HGB 12.1 08/29/2018   HCT 37.1 08/29/2018   MCV 82.2 08/29/2018   PLT 257 08/29/2018   Recent Labs    08/29/18 1253  NA 143  K 4.2  CL 110  CO2 25  GLUCOSE 130*  BUN 24*  CREATININE 0.91  CALCIUM 9.4  GFRNONAA >60  GFRAA >60  PROT 7.4  ALBUMIN 3.2*  AST 21  ALT 14  ALKPHOS 77  BILITOT 0.5   Iron/TIBC/Ferritin/ %Sat No results found for: IRON, TIBC, FERRITIN, IRONPCTSAT      ASSESSMENT & PLAN:  1. Vulvar cancer ([place])   2. Chronic anticoagulation   3. Goals of care, counseling/discussion   4. Other constipation   5. Neoplasm related pain   6. Atrial fibrillation, unspecified type ([place])   7. Vaginal discharge, bloody   labs reviewed and discussed with patient.   #Neoplasm related pain, switch to oxycodone 5 mg to 10 mg every 4-6 hours as needed.  Discussed the potential side effects and treatment/ prevention  of constipation,  respiratory depression,  and mental status changes.  #Recurrent vulvar cancer. During the interval we received addition records.  Extensive medical record review was performed.    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 The specimen biopsy only represents the superficial part of the lesion and focal invasion cannot be excluded. We have not received pathology slides to be evaluated by our pathologist. PET scan 07/29/2018 showed ulcerated perineal mass with known recurrent vulvar carcinoma.  Involving the left greater than right with posterior extension  into the region of gluteal cleft approximately 8.7 x 4.2 x 5.3 cm.  SUV 12.  There is also minimally FDG avid subcentimeter left inguinal lymph nodes, nonspecific.  Metastatic versus reactive.   We also received gynecology oncology operative note in September 2014.  Patient had vulvar biopsy x3 and a perirectal biopsy x1 at that time.  Pathology results not available to [place].  Discussed with patient and her granddaughter who accompanied patient to today's visit that patient has disease recurrence with involvement of the vaginal and the rectum.  Discussed with GYN oncology Dr. Theora Gianotti.  Surgery is not an option due to her poor performance status.  Exact duration will be palliative only. We agree with the plan for chemotherapy with dose reduced carboplatin and Taxol. Avastin is not a good option for her given bowel involvement and a high risk for fistulization.  It is unknown whether PDL 1 marker and [place] status was being sent from previous oncologist office or not. We are is doing a process of obtaining patient's pathology slides and blocks.  If it has not been sent before, we plan to send above testing.  Most recent pathology testing showed at least squamous cell carcinoma in situ, invasive component cannot be ruled out.  This may be secondary to a suboptimal biopsy.  Given patient's poor performance and severe symptoms, I do not think a repeat biopsy is going to add any value to current management.  Patient also agreed to proceed with chemotherapy treatments based on pathology diagnosis from outside pathologist. The diagnosis and care plan were discussed with patient in detail.     #  I explained to the patient the risks and benefits of chemotherapy carboplatin and Taxol including all but not limited to hair loss, mouth sore, nausea, vomiting, low blood counts, bleeding, and risk of life threatening  infection and even death, secondary malignancy etc.  Risk of neuropathy is associated with Taxol.We had  discussed the composition of chemotherapy regimen, length of chemo cycle, duration of treatment and the time to assess response to treatment.  Supportive care measures are necessary for patient well-being and will be provided as necessary. Patient voices understanding and willing to proceed chemotherapy.   #Goal of care, the goal of treatment which is to palliate disease, disease related symptoms, improve quality of life and hopefully prolong life was highlighted in our discussion.    # Chemotherapy education; port placement. Hopefully the planned start chemotherapy next week. Antiemetics-Zofran and Compazine; EMLA cream sent to pharmacy  We spent sufficient time to discuss many aspect of care, questions were answered to patient's satisfaction.   #Atrial fibrillation on chronic anticoagulation with Coumadin.  Currently managing her anticoagulation until she establishes care with local primary care provider. Recent INR was 1.8.  She is going to have Mediport placed and would need to hold Coumadin preoperatively. Recommend patient to resume Coumadin after procedure if hemostasis is achieved and okayed with surgeon. Repeat INR this Friday for further dosing of her Coumadin.  #Constipation, I recommend patient to take senna on a scheduled dose and use MiraLAX as needed for rescue.  We spent sufficient time to discuss many aspect of care, questions were answered to patient's satisfaction.  Orders Placed This Encounter  Procedures  . Protime-INR    Standing Status:   Future    Standing Expiration Date:   09/04/2019  . Protime-INR    Standing Status:   Future    Standing Expiration Date:   09/04/2019  . CBC with Differential    Standing Status:   Standing    Number of Occurrences:   20    Standing Expiration Date:   09/04/2019  . Comprehensive metabolic panel    Standing Status:   Standing    Number of Occurrences:   20    Standing Expiration Date:   09/04/2019    All questions were answered. The  patient knows to call the clinic with any problems questions or concerns.  Return of visit: 1 week for assessment prior to cycle 1 chemotherapy. Total face to face encounter time for this patient visit was 65 min. >50% of the time was  spent in counseling and coordination of care.    Earlie Server, MD, PhD Hematology Oncology [facility] Pager- [phone] 09/04/2018

## 2018-09-05 ENCOUNTER — Inpatient Hospital Stay: Payer: Medicare Other

## 2018-09-05 ENCOUNTER — Encounter: Payer: Self-pay | Admitting: Vascular Surgery

## 2018-09-05 DIAGNOSIS — Z7901 Long term (current) use of anticoagulants: Secondary | ICD-10-CM

## 2018-09-05 DIAGNOSIS — C519 Malignant neoplasm of vulva, unspecified: Secondary | ICD-10-CM | POA: Diagnosis not present

## 2018-09-05 LAB — PROTIME-INR
INR: 1.34
PROTHROMBIN TIME: 16.5 s — AB (ref 11.4–15.2)

## 2018-09-05 NOTE — Op Note (Signed)
      [facility]       Operative Note  Date: 09/04/2018  Preoperative diagnosis:  1. Vulvar cancer  Postoperative diagnosis:  Same as above  Procedures: #1. Ultrasound guidance for vascular access to the right internal jugular vein. #2. Fluoroscopic guidance for placement of catheter. #3. Placement of CT compatible Port-A-Cath, right internal jugular vein.  Surgeon: Leotis Pain, MD.   Anesthesia: Local with moderate conscious sedation for approximately 20  minutes using 2 mg of Versed and 50 mcg of Fentanyl  Fluoroscopy time: less than 1 minute  Contrast used: 0  Estimated blood loss: 5 cc  Indication for the procedure:  The patient is a 78 y.o.female with vulvar cancer.  The patient needs a Port-A-Cath for durable venous access, chemotherapy, lab draws, and CT scans. We are asked to place this. Risks and benefits were discussed and informed consent was obtained.  Description of procedure: The patient was brought to the vascular and interventional radiology suite.  Moderate conscious sedation was administered throughout the procedure during a face to face encounter with the patient with my supervision of the RN administering medicines and monitoring the patient's vital signs, pulse oximetry, telemetry and mental status throughout from the start of the procedure until the patient was taken to the recovery room. The right neck chest and shoulder were sterilely prepped and draped, and a sterile surgical field was created. Ultrasound was used to help visualize a patent right internal jugular vein. This was then accessed under direct ultrasound guidance without difficulty with the Seldinger needle and a permanent image was recorded. A J-wire was placed. After skin nick and dilatation, the peel-away sheath was then placed over the wire. I then anesthetized an area under the clavicle approximately 1-2 fingerbreadths. A transverse incision was created and an inferior pocket was  created with electrocautery and blunt dissection. The port was then brought onto the field, placed into the pocket and secured to the chest wall with 2 Prolene sutures. The catheter was connected to the port and tunneled from the subclavicular incision to the access site. Fluoroscopic guidance was then used to cut the catheter to an appropriate length. The catheter was then placed through the peel-away sheath and the peel-away sheath was removed. The catheter tip was parked in excellent location under fluorocoscopic guidance in the cavoatrial junction. The pocket was then irrigated with antibiotic impregnated saline and the wound was closed with a running 3-0 Vicryl and a 4-0 Monocryl. The access incision was closed with a single 4-0 Monocryl. The Huber needle was used to withdraw blood and flush the port with heparinized saline. Dermabond was then placed as a dressing. The patient tolerated the procedure well and was taken to the recovery room in stable condition.   Leotis Pain 09/05/2018 8:28 AM   This note was created with Dragon Medical transcription system. Any errors in dictation are purely unintentional.

## 2018-09-08 ENCOUNTER — Inpatient Hospital Stay: Payer: Medicare Other

## 2018-09-08 ENCOUNTER — Inpatient Hospital Stay: Payer: Medicare Other | Admitting: Oncology

## 2018-09-08 ENCOUNTER — Telehealth: Payer: Self-pay | Admitting: *Deleted

## 2018-09-08 DIAGNOSIS — C519 Malignant neoplasm of vulva, unspecified: Secondary | ICD-10-CM | POA: Diagnosis not present

## 2018-09-08 DIAGNOSIS — Z7901 Long term (current) use of anticoagulants: Secondary | ICD-10-CM

## 2018-09-08 LAB — PROTIME-INR
INR: 1.47
Prothrombin Time: 17.7 seconds — ABNORMAL HIGH (ref 11.4–15.2)

## 2018-09-08 NOTE — Telephone Encounter (Signed)
Called pt's daughter, Sharyn Lull, per Dr Tasia Catchings continue 3mg  coumadin daily.  We will repeat INR when she is here in the clinic on Friday.

## 2018-09-11 ENCOUNTER — Telehealth: Payer: Self-pay | Admitting: *Deleted

## 2018-09-11 NOTE — Telephone Encounter (Signed)
[facility] called requestion verbal order for plan of care. Patient is also requesting an antidepressant.  Call back for [facility] is [phone]

## 2018-09-12 ENCOUNTER — Other Ambulatory Visit: Payer: Self-pay

## 2018-09-12 ENCOUNTER — Inpatient Hospital Stay (HOSPITAL_BASED_OUTPATIENT_CLINIC_OR_DEPARTMENT_OTHER): Payer: Medicare Other | Admitting: Oncology

## 2018-09-12 ENCOUNTER — Inpatient Hospital Stay: Payer: Medicare Other

## 2018-09-12 ENCOUNTER — Telehealth: Payer: Self-pay

## 2018-09-12 ENCOUNTER — Encounter: Payer: Self-pay | Admitting: Oncology

## 2018-09-12 ENCOUNTER — Telehealth: Payer: Self-pay | Admitting: *Deleted

## 2018-09-12 ENCOUNTER — Encounter: Payer: Self-pay | Admitting: *Deleted

## 2018-09-12 VITALS — BP 93/50 | HR 62 | Temp 97.1°F | Resp 16 | Wt 109.5 lb

## 2018-09-12 DIAGNOSIS — Z7901 Long term (current) use of anticoagulants: Secondary | ICD-10-CM

## 2018-09-12 DIAGNOSIS — C519 Malignant neoplasm of vulva, unspecified: Secondary | ICD-10-CM | POA: Diagnosis not present

## 2018-09-12 DIAGNOSIS — G893 Neoplasm related pain (acute) (chronic): Secondary | ICD-10-CM | POA: Diagnosis not present

## 2018-09-12 DIAGNOSIS — I4891 Unspecified atrial fibrillation: Secondary | ICD-10-CM

## 2018-09-12 DIAGNOSIS — I959 Hypotension, unspecified: Secondary | ICD-10-CM | POA: Diagnosis not present

## 2018-09-12 DIAGNOSIS — K5900 Constipation, unspecified: Secondary | ICD-10-CM

## 2018-09-12 LAB — CBC WITH DIFFERENTIAL/PLATELET
BASOS ABS: 0.1 10*3/uL (ref 0–0.1)
Basophils Relative: 1 %
EOS PCT: 1 %
Eosinophils Absolute: 0.2 10*3/uL (ref 0–0.7)
HCT: 34 % — ABNORMAL LOW (ref 35.0–47.0)
Hemoglobin: 11.3 g/dL — ABNORMAL LOW (ref 12.0–16.0)
LYMPHS PCT: 15 %
Lymphs Abs: 1.6 10*3/uL (ref 1.0–3.6)
MCH: 27.3 pg (ref 26.0–34.0)
MCHC: 33.2 g/dL (ref 32.0–36.0)
MCV: 82.3 fL (ref 80.0–100.0)
MONO ABS: 0.8 10*3/uL (ref 0.2–0.9)
MONOS PCT: 7 %
NEUTROS ABS: 7.9 10*3/uL — AB (ref 1.4–6.5)
Neutrophils Relative %: 76 %
PLATELETS: 224 10*3/uL (ref 150–440)
RBC: 4.13 MIL/uL (ref 3.80–5.20)
RDW: 15.6 % — AB (ref 11.5–14.5)
WBC: 10.5 10*3/uL (ref 3.6–11.0)

## 2018-09-12 LAB — COMPREHENSIVE METABOLIC PANEL
ALT: 10 U/L (ref 0–44)
ANION GAP: 9 (ref 5–15)
AST: 20 U/L (ref 15–41)
Albumin: 2.9 g/dL — ABNORMAL LOW (ref 3.5–5.0)
Alkaline Phosphatase: 72 U/L (ref 38–126)
BUN: 33 mg/dL — ABNORMAL HIGH (ref 8–23)
CHLORIDE: 104 mmol/L (ref 98–111)
CO2: 27 mmol/L (ref 22–32)
Calcium: 9.1 mg/dL (ref 8.9–10.3)
Creatinine, Ser: 1.13 mg/dL — ABNORMAL HIGH (ref 0.44–1.00)
GFR calc Af Amer: 54 mL/min — ABNORMAL LOW (ref >60)
GFR calc non Af Amer: 47 mL/min — ABNORMAL LOW (ref >60)
Glucose, Bld: 182 mg/dL — ABNORMAL HIGH (ref 70–99)
POTASSIUM: 4.5 mmol/L (ref 3.5–5.1)
SODIUM: 140 mmol/L (ref 135–145)
TOTAL PROTEIN: 6.8 g/dL (ref 6.5–8.1)
Total Bilirubin: 0.8 mg/dL (ref 0.3–1.2)

## 2018-09-12 LAB — PROTIME-INR
INR: 1.64
PROTHROMBIN TIME: 19.3 s — AB (ref 11.4–15.2)

## 2018-09-12 MED ORDER — OXYCODONE HCL ER 15 MG PO T12A: 15.0000 mg | EXTENDED_RELEASE_TABLET | Freq: Two times a day (BID) | ORAL | 0 refills | Status: DC

## 2018-09-12 MED ORDER — OXYCODONE HCL 5 MG PO TABS: 5.0000 mg | ORAL_TABLET | ORAL | 0 refills | Status: DC | PRN

## 2018-09-12 MED ORDER — FENTANYL 25 MCG/HR TD PT72: 25.0000 ug | MEDICATED_PATCH | TRANSDERMAL | 0 refills | Status: DC

## 2018-09-12 MED ORDER — SODIUM CHLORIDE 0.9% FLUSH
10.0000 mL | INTRAVENOUS | Status: DC | PRN
  Administered 2018-09-12: 10 mL via INTRAVENOUS
  Filled 2018-09-12: qty 10

## 2018-09-12 MED ORDER — HEPARIN SOD (PORK) LOCK FLUSH 100 UNIT/ML IV SOLN
500.0000 [IU] | Freq: Once | INTRAVENOUS | Status: AC
  Administered 2018-09-12: 500 [IU] via INTRAVENOUS
  Filled 2018-09-12: qty 5

## 2018-09-12 NOTE — Progress Notes (Unsigned)
Slides received from [facility] Sent to pathology for slide review.

## 2018-09-12 NOTE — Telephone Encounter (Signed)
Slides requested from [facility] with second request sent 9-10. Called placed today to check on status. They have been shipped fed ex on 9-12. Tracking number [id number]. Request for PET on CD sent 9-5 with second request sent 9-11. [facility] contacted on 9-13 to check on status. They stated they have received request and will ship it out fedex today. Oncology Nurse Navigator Documentation  Navigator Location: [facility] (09/12/18 1000)   )Navigator Encounter Type: Telephone;Diagnostic Results (09/12/18 1000)                                                    Time Spent with Patient: 30 (09/12/18 1000)

## 2018-09-12 NOTE — Telephone Encounter (Signed)
There's no way to tell if insurance will cover without sending rx... Go ahead and send the Rx... I will let patient know of the changes being made

## 2018-09-12 NOTE — Progress Notes (Signed)
Hematology/Oncology follow up  note [facility] Telephone:([phone] Fax:([phone]) [phone]   Patient Care Team: Patient, No Pcp Per as PCP - General (General Practice)  REFERRING PROVIDER: [facility] Specialist.  REASON FOR VISIT Follow up for treatment of vulvar cancer  HISTORY OF PRESENTING ILLNESS:  Virginia Murray is a  77 y.o.  female with PMH listed below who was referred to me for evaluation of vulvar cancer.  Patient recently relocated from [place] to [place]. Reviewed medical records sent to [place] from [place] cancer in the blood specialist Dr. Marney Doctor, who is patient's previous oncologist. 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  She completed radiation to vulva and the right groin in August 2007 at [facility].  Since then she was on surveillance.  She was advised to have follow-up but she had to postpone as she was taking care of her husband was quite ill and unfortunately passed away On Aug 17, 2018, patient was found to have a 7 cm ulcerated lesion in the left vulva partially involving the vaginal and rectum on examination.  The lesion was biopsied by Dr. Tessa Lerner on 07/29/2018, PET scan showed ulcerated perineal mass 8.7 x 4.2 x 5.3 centimeters, SUV 12, extending into the region of gluteal cleft.  Left inguinal lymph nodes measured [1.1 x 0.7 cm]  SUV 1.6.  Which appeared nonspecific possibly metastatic or reactive. There was no evidence of distant metastasis. Given prior radiation treatment, she was advised by Dr. Tessa Lerner for chemotherapy.  She met Dr.Christi Maudie Mercury for discussion of treatment options.  She was recommended to start dose reduced carboplatin and Taxol.  Also NexGen sequencing PDL 1 testing was discussed, unknown if testing were sent.  Patient did not start treatment  as she is relocating to [place] to live with patient's son and wants to establish care with [facility].  Today patient reports moderate discomfort and pain in the vulvar area.  She also has some vaginal discharge and wear pads. Reports bleeding from her private area.  Denies any symptoms of voiding trouble, dysuria or incontinence. Reports constipation.  Past medical history includes paroxysmal atrial fibrillation and take coumadin '3mg'$  daily. She has not established care with PCP locally. No INR has been checked recently.   She had labs done on 08/13/2018 at [place] blood specialist office. BUN 21.3, creatinine 1.81, sodium 143, potassium 4, chloride 103, calcium 9.4, total protein 6.9, albumin 3.4, alkaline phosphatase 99 total bili 8.3, ALT 13 AST 18, iron 36, TIBC 241, iron saturation 14.9, magnesium 1.5, phosphorus 3.3, LDH 166, uric acid 5.8.  Folate 18.9, ferritin 84, B12 362, CEA 1.6. 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 The specimen biopsy only represents the superficial part of the lesion and focal invasion cannot be excluded. We have not received pathology slides to be evaluated by our pathologist. PET scan 07/29/2018 showed ulcerated perineal mass with known recurrent vulvar carcinoma.  Involving the left greater than right with posterior extension into the region of gluteal cleft approximately 8.7 x 4.2 x 5.3 cm.  SUV 12.  There is also minimally FDG avid subcentimeter left inguinal lymph nodes, nonspecific.  Metastatic versus reactive. We also received gynecology oncology  operative note in September 2014.  Patient had vulvar biopsy x3 and a perirectal biopsy x1 at that time.  Pathology results not available to [place].   # Discussed with patient and her  granddaughter who accompanied patient to today's visit that patient has disease recurrence with involvement of the vaginal and the rectum.  Discussed with GYN oncology Dr. Theora Gianotti.  Surgery is not an option due to her poor performance status.  Exact duration will be palliative only. We agree with the plan for chemotherapy with dose reduced carboplatin and Taxol. Avastin is not a good option for her given bowel involvement and a high risk for fistulization.  # It is unknown whether PDL 1 marker and [place] status was being sent from previous oncologist office or not. We are is doing a process of obtaining patient's pathology slides and blocks.  If it has not been sent before, we plan to send above testing.  # Most recent pathology testing showed at least squamous cell carcinoma in situ, invasive component cannot be ruled out.  This may be secondary to a suboptimal biopsy.  Given patient's poor performance and severe symptoms, I do not think a repeat biopsy is going to add any value to current management. Dr.Secord aware about pathology and also agrees that proceeding with chemotherapy without re-biopsy is reasonable.  Patient also agreed to proceed with chemotherapy treatments based on pathology diagnosis from outside pathologist. The diagnosis and care plan were discussed with patient in detail.    INTERVAL HISTORY Virginia Murray is a 77 y.o. female who has above history reviewed by me today presents for follow up visit for management of recurrent vulvur cancer and cancer symptom management. #Neoplasm related pain,  She takes Oxycodone 60m Q4-6 hours, feels some pain relief, however, still have moderate to severe pain, especially when sitting.   #Constipation, improved with taking  senna daily and MiraLAX PRN. .  #Vaginal bleeding, ongoing, spotting. #Chronic anticoagulation on Coumadin 3 mg. INR has been subtherapeutic 1.64, today's INR is pending. We have referred to her cardiologist.  #  Hypotension, she took her HTN medication including Atenolol, Amlodipine and benazepril. As well as her pain medication. Her BP in the clinic was 93/50. Denies feeling dizziness.   Review of Systems  Constitutional: Positive for malaise/fatigue. Negative for chills, fever and weight loss.  HENT: Negative for nosebleeds and sore throat.   Eyes: Negative for double vision, photophobia and redness.  Respiratory: Negative for cough, shortness of breath and wheezing.   Cardiovascular: Negative for chest pain, palpitations and orthopnea.  Gastrointestinal: Negative for abdominal pain, blood in stool, constipation, nausea and vomiting.  Genitourinary: Negative for dysuria.       Soreness/pain around Vulva mass, spotting blood.  Musculoskeletal: Negative for back pain, myalgias and neck pain.  Skin: Negative for itching and rash.  Neurological: Negative for dizziness, tingling and tremors.  Endo/Heme/Allergies: Negative for environmental allergies. Does not bruise/bleed easily.  Psychiatric/Behavioral: Negative for depression.    MEDICAL HISTORY:  Past Medical History:  Diagnosis Date  . [place]   . Cancer (HCC)    vulva   . Depression   . Diverticulitis   . Hyperlipemia   . Hypertension   . Vulva cancer ([facility]     SURGICAL HISTORY: Past Surgical History:  Procedure Laterality Date  . ABDOMINAL HYSTERECTOMY    . CATARACT EXTRACTION, BILATERAL    . KNEE SURGERY     right knee   . PORTA CATH INSERTION N/A 09/04/2018  Procedure: PORTA CATH INSERTION;  Surgeon: Algernon Huxley, MD;  Location: [facility];  Service: Cardiovascular;  Laterality: N/A;  . TONSILLECTOMY    . VULVA SURGERY      SOCIAL HISTORY: Social History   Socioeconomic History  . Marital status: Widowed    Spouse name: Not on file  . Number of children: 2  . Years of education: Not on file  . Highest education level: Not on file  Occupational History  . Not on file  Social Needs  . Financial resource  strain: Not on file  . Food insecurity:    Worry: Not on file    Inability: Not on file  . Transportation needs:    Medical: Not on file    Non-medical: Not on file  Tobacco Use  . Smoking status: Former Smoker    Packs/day: 1.00    Years: 20.00    Pack years: 20.00    Types: Cigarettes    Last attempt to quit: 08/29/1993    Years since quitting: 25.0  . Smokeless tobacco: Never Used  Substance and Sexual Activity  . Alcohol use: Not Currently  . Drug use: Never  . Sexual activity: Not Currently  Lifestyle  . Physical activity:    Days per week: Not on file    Minutes per session: Not on file  . Stress: Not on file  Relationships  . Social connections:    Talks on phone: Not on file    Gets together: Not on file    Attends religious service: Not on file    Active member of club or organization: Not on file    Attends meetings of clubs or organizations: Not on file    Relationship status: Not on file  . Intimate partner violence:    Fear of current or ex partner: Not on file    Emotionally abused: Not on file    Physically abused: Not on file    Forced sexual activity: Not on file  Other Topics Concern  . Not on file  Social History Narrative  . Not on file    FAMILY HISTORY: Family History  Problem Relation Age of Onset  . Alzheimer's disease Mother   . Heart disease Father     ALLERGIES:  has No Known Allergies.  MEDICATIONS:  Current Outpatient Medications  Medication Sig Dispense Refill  . amLODipine (NORVASC) 2.5 MG tablet Take 2.5 mg by mouth daily.    Marland Kitchen atenolol (TENORMIN) 100 MG tablet Take 100 mg by mouth daily.    . benazepril (LOTENSIN) 40 MG tablet Take 40 mg by mouth daily.    Marland Kitchen dexamethasone (DECADRON) 4 MG tablet Take 2 tablets (8 mg total) by mouth daily. Start the day after chemotherapy for 2 days. 30 tablet 1  . doxazosin (CARDURA) 8 MG tablet Take 8 mg by mouth daily.    Marland Kitchen lidocaine-prilocaine (EMLA) cream Apply to affected area once 30 g  3  . ondansetron (ZOFRAN) 8 MG tablet Take 1 tablet (8 mg total) by mouth 2 (two) times daily as needed for refractory nausea / vomiting. Start on day 3 after chemo. 30 tablet 1  . oxyCODONE (ROXICODONE) 5 MG immediate release tablet Take 1 tablet (5 mg total) by mouth every 4 (four) hours as needed for moderate pain or severe pain. 30 tablet 0  . polyethylene glycol (MIRALAX) packet Take 17 g by mouth daily as needed for mild constipation or moderate constipation. 30 each 0  .  pravastatin (PRAVACHOL) 40 MG tablet Take 40 mg by mouth daily.    . prochlorperazine (COMPAZINE) 10 MG tablet Take 1 tablet (10 mg total) by mouth every 6 (six) hours as needed (Nausea or vomiting). 30 tablet 1  . senna (SENOKOT) 8.6 MG TABS tablet Take 2 tablets (17.2 mg total) by mouth daily. 120 each 0  . warfarin (COUMADIN) 1 MG tablet Take 1 tablet (1 mg total) by mouth daily. 30 tablet 0   No current facility-administered medications for this visit.    Facility-Administered Medications Ordered in Other Visits  Medication Dose Route Frequency Provider Last Rate Last Dose  . heparin lock flush 100 unit/mL  500 Units Intravenous Once Earlie Server, MD      . sodium chloride flush (NS) 0.9 % injection 10 mL  10 mL Intravenous PRN Earlie Server, MD   10 mL at 09/12/18 0830     PHYSICAL EXAMINATION: ECOG PERFORMANCE STATUS: 2 - Symptomatic, <50% confined to bed Vitals:   09/12/18 0841  BP: (!) 93/50  Pulse: 62  Resp: 16  Temp: (!) 97.1 F (36.2 C)   Filed Weights   09/12/18 0841  Weight: 109 lb 8 oz (49.7 kg)    Physical Exam  Constitutional: She is oriented to person, place, and time. No distress.  Frail appearance elderly female, mild distress sitting on pillow  HENT:  Head: Normocephalic and atraumatic.  Right Ear: External ear normal.  Left Ear: External ear normal.  Mouth/Throat: Oropharynx is clear and moist.  Eyes: Pupils are equal, round, and reactive to light. EOM are normal. No scleral icterus.    Neck: Normal range of motion. Neck supple.  Cardiovascular: Normal rate, regular rhythm and normal heart sounds.  Pulmonary/Chest: Effort normal. No respiratory distress. She has no wheezes.  Abdominal: Soft. Bowel sounds are normal. She exhibits no distension and no mass. There is no tenderness.  Genitourinary: Vaginal discharge found.  Genitourinary Comments: Bloody vaginal discharge. Left vulvar ulcerated mass extending to the angina, foul-smelling odor  Musculoskeletal: Normal range of motion. She exhibits no edema or deformity.  Neurological: She is alert and oriented to person, place, and time. No cranial nerve deficit. Coordination normal.  Skin: Skin is warm and dry. No rash noted. No erythema.  Psychiatric: She has a normal mood and affect. Her behavior is normal. Thought content normal.     LABORATORY DATA:  I have reviewed the data as listed Lab Results  Component Value Date   WBC 10.1 08/29/2018   HGB 12.1 08/29/2018   HCT 37.1 08/29/2018   MCV 82.2 08/29/2018   PLT 257 08/29/2018   Recent Labs    08/29/18 1253  NA 143  K 4.2  CL 110  CO2 25  GLUCOSE 130*  BUN 24*  CREATININE 0.91  CALCIUM 9.4  GFRNONAA >60  GFRAA >60  PROT 7.4  ALBUMIN 3.2*  AST 21  ALT 14  ALKPHOS 77  BILITOT 0.5   Iron/TIBC/Ferritin/ %Sat No results found for: IRON, TIBC, FERRITIN, IRONPCTSAT      ASSESSMENT & PLAN:  1. Chronic anticoagulation   2. Vulvar cancer ([place])   3. Neoplasm related pain   4. Hypotension, unspecified hypotension type   labs reviewed and discussed with patient.   #Neoplasm related pain,Continue Oxycodone 12m every 4-6 hours as needed. Add long acting Oxycontin. Insurance not covering Oxycotin. Change to Fentanyl patch 257m/h Q72h. Rx sent to pharmacy.    Discussed the potential side effects and treatment/ prevention  of constipation,  respiratory depression,  and mental status changes.  #Recurrent vulvar cancer. Hold chemotherapy today due to  hypotension. Re-evaluate next week.   # Hypotension, likely drug induced. I reviewed her HTN medication with patient. Hold Norvasc, benazepril. Advise patient to monitor blood pressure home. She is on Atenolol for A-Fib rate control. Will continue. She is asymptomatic.  Re-evaluate next week.   #Atrial fibrillation on chronic anticoagulation with Coumadin.  Currently managing her anticoagulation until she establishes care with local primary care provider. Recent INR was 1.64, continue 87m daily. Repeat INR in 3 days.   #Constipation, improved. Continue senna on a scheduled dose and use MiraLAX as needed for rescue. We spent sufficient time to discuss many aspect of care, questions were answered to patient's satisfaction.  Orders Placed This Encounter  Procedures  . Protime-INR    Standing Status:   Future    Standing Expiration Date:   09/13/2019    All questions were answered. The patient knows to call the clinic with any problems questions or concerns.  Return of visit: 3 days assessment prior to cycle 1 chemotherapy. Total face to face encounter time for this patient visit was 25 min. >50% of the time was  spent in counseling and coordination of care.   ZEarlie Server MD, PhD Hematology Oncology CPeach Regional [facility] Centerat APam [facility] BraunfelsPager- [phone]/13/2019

## 2018-09-12 NOTE — Telephone Encounter (Signed)
Called the pharmacy...  oxyCODONE (OXYCONTIN) 15 mg 12 hr tablet is not covered by Medicare

## 2018-09-12 NOTE — Telephone Encounter (Signed)
Will they cover fentanyl patch?

## 2018-09-12 NOTE — Telephone Encounter (Signed)
Pharmacy advised that patient's insurance does not cover oxyCODONE.

## 2018-09-12 NOTE — Progress Notes (Signed)
Patient here today for follow up and new chemotherapy start

## 2018-09-13 ENCOUNTER — Other Ambulatory Visit: Payer: Self-pay

## 2018-09-13 ENCOUNTER — Emergency Department: Payer: Medicare Other

## 2018-09-13 ENCOUNTER — Inpatient Hospital Stay
Admission: EM | Admit: 2018-09-13 | Discharge: 2018-09-17 | DRG: 872 | Disposition: A | Discharge status: Home Health | Payer: Medicare Other | Attending: Internal Medicine | Admitting: Internal Medicine

## 2018-09-13 DIAGNOSIS — A408 Other streptococcal sepsis: Secondary | ICD-10-CM | POA: Diagnosis present

## 2018-09-13 DIAGNOSIS — N766 Ulceration of vulva: Secondary | ICD-10-CM | POA: Diagnosis not present

## 2018-09-13 DIAGNOSIS — D630 Anemia in neoplastic disease: Secondary | ICD-10-CM | POA: Diagnosis present

## 2018-09-13 DIAGNOSIS — Z79899 Other long term (current) drug therapy: Secondary | ICD-10-CM

## 2018-09-13 DIAGNOSIS — I482 Chronic atrial fibrillation: Secondary | ICD-10-CM | POA: Diagnosis not present

## 2018-09-13 DIAGNOSIS — Z820 Family history of epilepsy and other diseases of the nervous system: Secondary | ICD-10-CM

## 2018-09-13 DIAGNOSIS — N390 Urinary tract infection, site not specified: Secondary | ICD-10-CM | POA: Diagnosis present

## 2018-09-13 DIAGNOSIS — R32 Unspecified urinary incontinence: Secondary | ICD-10-CM | POA: Diagnosis present

## 2018-09-13 DIAGNOSIS — R451 Restlessness and agitation: Secondary | ICD-10-CM | POA: Diagnosis present

## 2018-09-13 DIAGNOSIS — B370 Candidal stomatitis: Secondary | ICD-10-CM | POA: Diagnosis not present

## 2018-09-13 DIAGNOSIS — R5081 Fever presenting with conditions classified elsewhere: Secondary | ICD-10-CM | POA: Diagnosis not present

## 2018-09-13 DIAGNOSIS — D649 Anemia, unspecified: Secondary | ICD-10-CM | POA: Diagnosis not present

## 2018-09-13 DIAGNOSIS — I10 Essential (primary) hypertension: Secondary | ICD-10-CM | POA: Diagnosis present

## 2018-09-13 DIAGNOSIS — B955 Unspecified streptococcus as the cause of diseases classified elsewhere: Secondary | ICD-10-CM | POA: Diagnosis not present

## 2018-09-13 DIAGNOSIS — Z7901 Long term (current) use of anticoagulants: Secondary | ICD-10-CM

## 2018-09-13 DIAGNOSIS — Z66 Do not resuscitate: Secondary | ICD-10-CM | POA: Diagnosis present

## 2018-09-13 DIAGNOSIS — R509 Fever, unspecified: Secondary | ICD-10-CM | POA: Diagnosis present

## 2018-09-13 DIAGNOSIS — F329 Major depressive disorder, single episode, unspecified: Secondary | ICD-10-CM | POA: Diagnosis present

## 2018-09-13 DIAGNOSIS — R079 Chest pain, unspecified: Secondary | ICD-10-CM

## 2018-09-13 DIAGNOSIS — K449 Diaphragmatic hernia without obstruction or gangrene: Secondary | ICD-10-CM | POA: Diagnosis present

## 2018-09-13 DIAGNOSIS — Z90710 Acquired absence of both cervix and uterus: Secondary | ICD-10-CM

## 2018-09-13 DIAGNOSIS — I700 Atherosclerosis of aorta: Secondary | ICD-10-CM | POA: Diagnosis present

## 2018-09-13 DIAGNOSIS — I480 Paroxysmal atrial fibrillation: Secondary | ICD-10-CM | POA: Diagnosis present

## 2018-09-13 DIAGNOSIS — Z95828 Presence of other vascular implants and grafts: Secondary | ICD-10-CM | POA: Diagnosis not present

## 2018-09-13 DIAGNOSIS — Z9841 Cataract extraction status, right eye: Secondary | ICD-10-CM | POA: Diagnosis not present

## 2018-09-13 DIAGNOSIS — Z9842 Cataract extraction status, left eye: Secondary | ICD-10-CM

## 2018-09-13 DIAGNOSIS — K0889 Other specified disorders of teeth and supporting structures: Secondary | ICD-10-CM | POA: Diagnosis not present

## 2018-09-13 DIAGNOSIS — R454 Irritability and anger: Secondary | ICD-10-CM | POA: Diagnosis not present

## 2018-09-13 DIAGNOSIS — F411 Generalized anxiety disorder: Secondary | ICD-10-CM | POA: Diagnosis present

## 2018-09-13 DIAGNOSIS — R7881 Bacteremia: Secondary | ICD-10-CM | POA: Diagnosis not present

## 2018-09-13 DIAGNOSIS — Z8249 Family history of ischemic heart disease and other diseases of the circulatory system: Secondary | ICD-10-CM

## 2018-09-13 DIAGNOSIS — E785 Hyperlipidemia, unspecified: Secondary | ICD-10-CM | POA: Diagnosis present

## 2018-09-13 DIAGNOSIS — F419 Anxiety disorder, unspecified: Secondary | ICD-10-CM | POA: Diagnosis not present

## 2018-09-13 DIAGNOSIS — Z87891 Personal history of nicotine dependence: Secondary | ICD-10-CM

## 2018-09-13 DIAGNOSIS — Z7189 Other specified counseling: Secondary | ICD-10-CM | POA: Diagnosis not present

## 2018-09-13 DIAGNOSIS — C519 Malignant neoplasm of vulva, unspecified: Secondary | ICD-10-CM | POA: Diagnosis present

## 2018-09-13 DIAGNOSIS — Z9221 Personal history of antineoplastic chemotherapy: Secondary | ICD-10-CM

## 2018-09-13 DIAGNOSIS — G893 Neoplasm related pain (acute) (chronic): Secondary | ICD-10-CM | POA: Diagnosis present

## 2018-09-13 DIAGNOSIS — B954 Other streptococcus as the cause of diseases classified elsewhere: Secondary | ICD-10-CM | POA: Diagnosis not present

## 2018-09-13 DIAGNOSIS — Z923 Personal history of irradiation: Secondary | ICD-10-CM | POA: Diagnosis not present

## 2018-09-13 DIAGNOSIS — D509 Iron deficiency anemia, unspecified: Secondary | ICD-10-CM | POA: Diagnosis present

## 2018-09-13 DIAGNOSIS — K5900 Constipation, unspecified: Secondary | ICD-10-CM | POA: Diagnosis present

## 2018-09-13 DIAGNOSIS — E538 Deficiency of other specified B group vitamins: Secondary | ICD-10-CM | POA: Diagnosis present

## 2018-09-13 DIAGNOSIS — Z9079 Acquired absence of other genital organ(s): Secondary | ICD-10-CM | POA: Diagnosis not present

## 2018-09-13 DIAGNOSIS — D638 Anemia in other chronic diseases classified elsewhere: Secondary | ICD-10-CM | POA: Diagnosis not present

## 2018-09-13 LAB — BLOOD CULTURE ID PANEL (REFLEXED)
Acinetobacter baumannii: NOT DETECTED
CANDIDA PARAPSILOSIS: NOT DETECTED
Candida albicans: NOT DETECTED
Candida glabrata: NOT DETECTED
Candida krusei: NOT DETECTED
Candida tropicalis: NOT DETECTED
ENTEROCOCCUS SPECIES: NOT DETECTED
Enterobacter cloacae complex: NOT DETECTED
Enterobacteriaceae species: NOT DETECTED
Escherichia coli: NOT DETECTED
HAEMOPHILUS INFLUENZAE: NOT DETECTED
Klebsiella oxytoca: NOT DETECTED
Klebsiella pneumoniae: NOT DETECTED
LISTERIA MONOCYTOGENES: NOT DETECTED
NEISSERIA MENINGITIDIS: NOT DETECTED
PSEUDOMONAS AERUGINOSA: NOT DETECTED
Proteus species: NOT DETECTED
SERRATIA MARCESCENS: NOT DETECTED
STAPHYLOCOCCUS AUREUS BCID: NOT DETECTED
STREPTOCOCCUS PNEUMONIAE: NOT DETECTED
STREPTOCOCCUS PYOGENES: NOT DETECTED
STREPTOCOCCUS SPECIES: DETECTED — AB
Staphylococcus species: NOT DETECTED
Streptococcus agalactiae: NOT DETECTED

## 2018-09-13 LAB — VITAMIN B12: VITAMIN B 12: 130 pg/mL — AB (ref 180–914)

## 2018-09-13 LAB — PROCALCITONIN: Procalcitonin: 0.11 ng/mL

## 2018-09-13 LAB — CBC WITH DIFFERENTIAL/PLATELET
Basophils Absolute: 0 10*3/uL (ref 0–0.1)
Basophils Relative: 0 %
EOS ABS: 0 10*3/uL (ref 0–0.7)
Eosinophils Relative: 0 %
HEMATOCRIT: 32.1 % — AB (ref 35.0–47.0)
Hemoglobin: 10.8 g/dL — ABNORMAL LOW (ref 12.0–16.0)
LYMPHS ABS: 0.5 10*3/uL — AB (ref 1.0–3.6)
LYMPHS PCT: 5 %
MCH: 27.4 pg (ref 26.0–34.0)
MCHC: 33.8 g/dL (ref 32.0–36.0)
MCV: 81.2 fL (ref 80.0–100.0)
Monocytes Absolute: 0.4 10*3/uL (ref 0.2–0.9)
Monocytes Relative: 4 %
NEUTROS PCT: 91 %
Neutro Abs: 9.7 10*3/uL — ABNORMAL HIGH (ref 1.4–6.5)
Platelets: 193 10*3/uL (ref 150–440)
RBC: 3.96 MIL/uL (ref 3.80–5.20)
RDW: 15.7 % — ABNORMAL HIGH (ref 11.5–14.5)
WBC: 10.7 10*3/uL (ref 3.6–11.0)

## 2018-09-13 LAB — COMPREHENSIVE METABOLIC PANEL
ALT: 10 U/L (ref 0–44)
ANION GAP: 7 (ref 5–15)
AST: 15 U/L (ref 15–41)
Albumin: 2.7 g/dL — ABNORMAL LOW (ref 3.5–5.0)
Alkaline Phosphatase: 67 U/L (ref 38–126)
BILIRUBIN TOTAL: 0.7 mg/dL (ref 0.3–1.2)
BUN: 31 mg/dL — ABNORMAL HIGH (ref 8–23)
CO2: 24 mmol/L (ref 22–32)
Calcium: 8.6 mg/dL — ABNORMAL LOW (ref 8.9–10.3)
Chloride: 105 mmol/L (ref 98–111)
Creatinine, Ser: 0.94 mg/dL (ref 0.44–1.00)
GFR calc Af Amer: >60 mL/min (ref >60)
GFR, EST NON AFRICAN AMERICAN: 58 mL/min — AB (ref >60)
Glucose, Bld: 121 mg/dL — ABNORMAL HIGH (ref 70–99)
POTASSIUM: 4.2 mmol/L (ref 3.5–5.1)
Sodium: 136 mmol/L (ref 135–145)
TOTAL PROTEIN: 6.2 g/dL — AB (ref 6.5–8.1)

## 2018-09-13 LAB — PROTIME-INR
INR: 1.84
PROTHROMBIN TIME: 21.1 s — AB (ref 11.4–15.2)

## 2018-09-13 LAB — FOLATE: FOLATE: 27 ng/mL (ref >5.9)

## 2018-09-13 LAB — IRON AND TIBC
Iron: 19 ug/dL — ABNORMAL LOW (ref 28–170)
SATURATION RATIOS: 10 % — AB (ref 10.4–31.8)
TIBC: 196 ug/dL — ABNORMAL LOW (ref 250–450)
UIBC: 177 ug/dL

## 2018-09-13 LAB — LACTIC ACID, PLASMA: LACTIC ACID, VENOUS: 0.9 mmol/L (ref 0.5–1.9)

## 2018-09-13 LAB — FERRITIN: Ferritin: 66 ng/mL (ref 11–307)

## 2018-09-13 MED ORDER — POLYETHYLENE GLYCOL 3350 17 G PO PACK: 17.0000 g | PACK | Freq: Every day | ORAL | Status: DC | PRN

## 2018-09-13 MED ORDER — WARFARIN SODIUM 3 MG PO TABS
3.0000 mg | ORAL_TABLET | Freq: Once | ORAL | Status: AC
  Administered 2018-09-13: 3 mg via ORAL
  Filled 2018-09-13: qty 1

## 2018-09-13 MED ORDER — PRAVASTATIN SODIUM 20 MG PO TABS
40.0000 mg | ORAL_TABLET | Freq: Every day | ORAL | Status: DC
  Administered 2018-09-13 – 2018-09-16 (×5): 40 mg via ORAL
  Filled 2018-09-13 (×5): qty 2

## 2018-09-13 MED ORDER — ONDANSETRON HCL 4 MG/2ML IJ SOLN: 4.0000 mg | Freq: Four times a day (QID) | INTRAMUSCULAR | Status: DC | PRN

## 2018-09-13 MED ORDER — ACETAMINOPHEN 325 MG PO TABS
650.0000 mg | ORAL_TABLET | Freq: Four times a day (QID) | ORAL | Status: DC | PRN
  Administered 2018-09-13: 20:00:00 650 mg via ORAL
  Filled 2018-09-13: qty 2

## 2018-09-13 MED ORDER — SODIUM CHLORIDE 0.9 % IV SOLN
2.0000 g | Freq: Two times a day (BID) | INTRAVENOUS | Status: DC
  Administered 2018-09-13 – 2018-09-15 (×4): 2 g via INTRAVENOUS
  Filled 2018-09-13 (×6): qty 2

## 2018-09-13 MED ORDER — FENTANYL CITRATE (PF) 100 MCG/2ML IJ SOLN
75.0000 ug | Freq: Once | INTRAMUSCULAR | Status: AC
  Administered 2018-09-13: 75 ug via INTRAVENOUS
  Filled 2018-09-13: qty 2

## 2018-09-13 MED ORDER — FENTANYL 25 MCG/HR TD PT72
25.0000 ug | MEDICATED_PATCH | TRANSDERMAL | Status: DC
  Administered 2018-09-13 – 2018-09-16 (×2): 25 ug via TRANSDERMAL
  Filled 2018-09-13 (×2): qty 1

## 2018-09-13 MED ORDER — OXYCODONE HCL 5 MG PO TABS
5.0000 mg | ORAL_TABLET | ORAL | Status: DC | PRN
  Administered 2018-09-13 – 2018-09-17 (×17): 5 mg via ORAL
  Filled 2018-09-13 (×18): qty 1

## 2018-09-13 MED ORDER — ONDANSETRON HCL 4 MG PO TABS
4.0000 mg | ORAL_TABLET | Freq: Four times a day (QID) | ORAL | Status: DC | PRN
  Administered 2018-09-13: 15:00:00 4 mg via ORAL
  Filled 2018-09-13: qty 1

## 2018-09-13 MED ORDER — METRONIDAZOLE IN NACL 5-0.79 MG/ML-% IV SOLN
500.0000 mg | Freq: Three times a day (TID) | INTRAVENOUS | Status: DC
  Administered 2018-09-13: 500 mg via INTRAVENOUS
  Filled 2018-09-13 (×3): qty 100

## 2018-09-13 MED ORDER — SODIUM CHLORIDE 0.9 % IV SOLN
2.0000 g | Freq: Once | INTRAVENOUS | Status: AC
  Administered 2018-09-13: 2 g via INTRAVENOUS
  Filled 2018-09-13: qty 2

## 2018-09-13 MED ORDER — SODIUM CHLORIDE 0.9 % IV SOLN
INTRAVENOUS | Status: DC
  Administered 2018-09-13 – 2018-09-17 (×4): via INTRAVENOUS

## 2018-09-13 MED ORDER — WARFARIN - PHARMACIST DOSING INPATIENT
Freq: Every day | Status: DC
  Administered 2018-09-15 – 2018-09-16 (×2)
  Filled 2018-09-13 (×5): qty 1

## 2018-09-13 MED ORDER — SENNA 8.6 MG PO TABS
2.0000 | ORAL_TABLET | Freq: Every day | ORAL | Status: DC
  Administered 2018-09-14 – 2018-09-15 (×2): 17.2 mg via ORAL
  Filled 2018-09-13 (×5): qty 2

## 2018-09-13 MED ORDER — VANCOMYCIN HCL IN DEXTROSE 750-5 MG/150ML-% IV SOLN: 750.0000 mg | INTRAVENOUS | Status: DC

## 2018-09-13 MED ORDER — PROCHLORPERAZINE MALEATE 10 MG PO TABS
10.0000 mg | ORAL_TABLET | Freq: Four times a day (QID) | ORAL | Status: DC | PRN
  Filled 2018-09-13: qty 1

## 2018-09-13 MED ORDER — ATENOLOL 100 MG PO TABS
100.0000 mg | ORAL_TABLET | Freq: Every day | ORAL | Status: DC
  Administered 2018-09-13 – 2018-09-17 (×5): 100 mg via ORAL
  Filled 2018-09-13 (×6): qty 1

## 2018-09-13 MED ORDER — VANCOMYCIN HCL IN DEXTROSE 1-5 GM/200ML-% IV SOLN
1000.0000 mg | Freq: Once | INTRAVENOUS | Status: AC
  Administered 2018-09-13: 1000 mg via INTRAVENOUS
  Filled 2018-09-13: qty 200

## 2018-09-13 MED ORDER — ACETAMINOPHEN 650 MG RE SUPP: 650.0000 mg | Freq: Four times a day (QID) | RECTAL | Status: DC | PRN

## 2018-09-13 MED ORDER — FENTANYL 25 MCG/HR TD PT72: 25.0000 ug | MEDICATED_PATCH | TRANSDERMAL | Status: DC

## 2018-09-13 NOTE — ED Provider Notes (Signed)
[facility] Emergency Department Provider Note  Time seen: 9:03 AM  I have reviewed the triage vital signs and the nursing notes.   HISTORY  Chief Complaint Code Sepsis    HPI Virginia Murray is a 77 y.o. female with a past medical history of atrial fibrillation, vulvar cancer, hypertension, hyperlipidemia, presents to the emergency department for weakness and fever.  According to the patient she was awake last night with chills.  Feeling very weak this morning noted to be febrile and brought to the emergency department for evaluation.  Here the patient is awake alert, does state pain which is chronic in the groin area due to her oncologic process.  Takes oxycodone at home usually.  Otherwise denies any dysuria though states baseline incontinence.  Denies any cough or congestion.  Denies any upper abdominal pain, nausea vomiting or diarrhea.   Past Medical History:  Diagnosis Date  . A-fib ([place])   . Cancer (HCC)    vulva   . Depression   . Diverticulitis   . Hyperlipemia   . Hypertension   . Vulva cancer [facility])     Patient Active Problem List   Diagnosis Date Noted  . Vulvar cancer ([place]) 09/03/2018  . Goals of care, counseling/discussion 09/03/2018    Past Surgical History:  Procedure Laterality Date  . ABDOMINAL HYSTERECTOMY    . CATARACT EXTRACTION, BILATERAL    . KNEE SURGERY     right knee   . PORTA CATH INSERTION N/A 09/04/2018   Procedure: PORTA CATH INSERTION;  Surgeon: Algernon Huxley, MD;  Location: [facility];  Service: Cardiovascular;  Laterality: N/A;  . TONSILLECTOMY    . VULVA SURGERY      Prior to Admission medications   Medication Sig Start Date End Date Taking? Authorizing Provider  amLODipine (NORVASC) 2.5 MG tablet Take 2.5 mg by mouth daily.    [provider]  atenolol (TENORMIN) 100 MG tablet Take 100 mg by mouth daily.    [provider]  benazepril (LOTENSIN) 40 MG tablet Take 40 mg by mouth  daily.    [provider]  dexamethasone (DECADRON) 4 MG tablet Take 2 tablets (8 mg total) by mouth daily. Start the day after chemotherapy for 2 days. 09/03/18   Earlie Server, MD  doxazosin (CARDURA) 8 MG tablet Take 8 mg by mouth daily.    [provider]  fentaNYL (DURAGESIC - DOSED MCG/HR) 25 MCG/HR patch Place 1 patch (25 mcg total) onto the skin every 3 (three) days. 09/12/18   Earlie Server, MD  lidocaine-prilocaine (EMLA) cream Apply to affected area once 09/03/18   Earlie Server, MD  ondansetron (ZOFRAN) 8 MG tablet Take 1 tablet (8 mg total) by mouth 2 (two) times daily as needed for refractory nausea / vomiting. Start on day 3 after chemo. 09/03/18   Earlie Server, MD  oxyCODONE (ROXICODONE) 5 MG immediate release tablet Take 1 tablet (5 mg total) by mouth every 4 (four) hours as needed for moderate pain or severe pain. 09/12/18   Earlie Server, MD  polyethylene glycol [facility]) packet Take 17 g by mouth daily as needed for mild constipation or moderate constipation. 08/29/18   Earlie Server, MD  pravastatin (PRAVACHOL) 40 MG tablet Take 40 mg by mouth daily.    [provider]  prochlorperazine (COMPAZINE) 10 MG tablet Take 1 tablet (10 mg total) by mouth every 6 (six) hours as needed (Nausea or vomiting). 09/03/18   Earlie Server, MD  senna (SENOKOT) 8.6 MG TABS tablet Take 2 tablets (17.2 mg total) by mouth daily. 08/29/18   Earlie Server, MD  warfarin (COUMADIN) 1 MG tablet Take 1 tablet (1 mg total) by mouth daily. 08/29/18   Earlie Server, MD    No Known Allergies  Family History  Problem Relation Age of Onset  . Alzheimer's disease Mother   . Heart disease Father     Social History Social History   Tobacco Use  . Smoking status: Former Smoker    Packs/day: 1.00    Years: 20.00    Pack years: 20.00    Types: Cigarettes    Last attempt to quit: 08/29/1993    Years since quitting: 25.0  . Smokeless tobacco: Never Used  Substance Use Topics  . Alcohol use: Not Currently  . Drug use: Never     Review of Systems Constitutional: Positive for fever Eyes: Negative for visual complaints ENT: Negative for recent illness/congestion Cardiovascular: Negative for chest pain. Respiratory: Negative for shortness of breath. Gastrointestinal: Positive for groin pain, chronic.  Negative for mid upper abdominal pain.  Negative for nausea vomiting or diarrhea. Genitourinary: Baseline incontinence but denies dysuria. Musculoskeletal: Negative for musculoskeletal complaints Skin: Negative for skin complaints  Neurological: Negative for headache All other ROS negative  ____________________________________________   PHYSICAL EXAM:  VITAL SIGNS: ED Triage Vitals  Enc Vitals Group     BP 09/13/18 0851 136/61     Pulse Rate 09/13/18 0851 74     Resp 09/13/18 0851 16     Temp 09/13/18 0851 (!) 102.6 F (39.2 C)     Temp Source 09/13/18 0851 Oral     SpO2 09/13/18 0851 94 %     Weight 09/13/18 0852 109 lb 7.7 oz (49.7 kg)     Height 09/13/18 0852 5\' 2"  (1.575 m)     Head Circumference --      Peak Flow --      Pain Score 09/13/18 0852 6     Pain Loc --      Pain Edu? --      Excl. in [place]? --    Constitutional: Alert and oriented. Well appearing and in no distress. Eyes: Normal exam ENT   Head: Normocephalic and atraumatic   Mouth/Throat: Mucous membranes are moist. Cardiovascular: Normal rate, regular rhythm.  Respiratory: Normal respiratory effort without tachypnea nor retractions. Breath sounds are clear and equal bilaterally. No wheezes/rales/rhonchi. Gastrointestinal: Soft, largely nontender abdomen.  No rebound guarding or distention. Musculoskeletal: Nontender with normal range of motion in all extremities. No lower extremity tenderness or edema. Neurologic:  Normal speech and language. No gross focal neurologic deficits  Skin:  Skin is warm, dry and intact.  Psychiatric: Mood and affect are normal. Speech and behavior are normal.    ____________________________________________    EKG  EKG reviewed and interpreted by myself shows normal sinus rhythm at 73 bpm, narrow QRS, normal axis, normal intervals, no concerning ST changes.  ____________________________________________    RADIOLOGY  Chest x-ray negative  ____________________________________________   INITIAL IMPRESSION / ASSESSMENT AND PLAN / ED COURSE  Pertinent labs & imaging results that were available during my care of the patient were reviewed by me and considered in my medical decision making (see chart for details).  Patient presents to the emergency department with weakness and fever noted to have a temperature of 102.6 in the emergency department.  Differential would include infectious etiologies such as pneumonia, urinary tract infection.  We will check labs,  cultures.  Given the patient's complaint of weakness and significant fever we will start broad-spectrum antibiotics under sepsis protocols.  Patient's blood work has resulted largely within normal limits.  However given the patient's weakness I highly suspect infectious etiology given her fever to 102.6.  We are unable to obtain urinalysis as the patient is incontinent and has vulvar tumors which would make catheterization very difficult and likely very painful for the patient.  We will continue with broad-spectrum antibiotics and admit to the hospitalist service.  Patient and family agreeable to this plan of care  ____________________________________________   FINAL CLINICAL IMPRESSION(S) / ED DIAGNOSES  Fever Weakness    Harvest Dark, MD 09/14/18 [phone]

## 2018-09-13 NOTE — ED Triage Notes (Signed)
Pt presents to [facility] from home [facility]ia [facility]. Pt was to start chemo yesterday but was unable as pt has hypotensive. Pt has strong urine smell and abd pain 2023/06/28. Pt husband just died and pt is learning to self care again.

## 2018-09-13 NOTE — Progress Notes (Signed)
Virginia Murray received an OR for spiritual care, due to reported life changes, Husband reportedly recently died, she relocated from [place], and  now she has been diagnosed with cancer. CH presented to the room, knocked on the door notifying the patient the [facility] was there, I was asked to return tomorrow, [facility] acknowledged the patient's request and will return tomorrow to check on her.   09/13/18 1500  Clinical Encounter Type  Visited With Patient  Visit Type Initial  Referral From Physician  Consult/Referral To Chaplain  Spiritual Encounters  Spiritual Needs Emotional  Stress Factors  Patient Stress Factors Health changes  Family Stress Factors Major life changes

## 2018-09-13 NOTE — ED Notes (Signed)
Contact person- Sharyn Lull- daughter in law- [phone]

## 2018-09-13 NOTE — Progress Notes (Signed)
Pt temp was 102.3, MD made aware, Tylenol given. Temp has decreased to 98.7

## 2018-09-13 NOTE — H&P (Addendum)
Virginia Murray NAME: Arantxa Piercey    MR#:  [id number]  DATE OF BIRTH:  1941-10-02  DATE OF ADMISSION:  09/13/2018  PRIMARY CARE PHYSICIAN: Patient, No Pcp Per   REQUESTING/REFERRING PHYSICIAN: Harvest Dark, MD  CHIEF COMPLAINT:   Chief Complaint  Patient presents with  . Code Sepsis    HISTORY OF PRESENT ILLNESS:  Virginia Murray  is a 77 y.o. female with a known history of vulvar cancer, HTN, HLD, paroxysmal a-fib who presented to the ED with fevers and chills starting today. She states she just woke up feeling sick and couldn't stop shaking. She denies any shortness of breath or cough. She has had increased urinary frequency, malodorous urine, and suprapubic abdominal pain starting today. No dysuria.   In the ED, she was febrile to 102.16F. CXR was negative. WBC count 10.7. Unable to obtain urine sample due to patient's anatomy and vulvar cancer. She was started on empiric vancomycin, cefepime, and flagyl. Hospitalists were called for admission.  PAST MEDICAL HISTORY:   Past Medical History:  Diagnosis Date  . A-fib ([place])   . Cancer (HCC)    vulva   . Depression   . Diverticulitis   . Hyperlipemia   . Hypertension   . Vulva cancer ([place])     PAST SURGICAL HISTORY:   Past Surgical History:  Procedure Laterality Date  . ABDOMINAL HYSTERECTOMY    . CATARACT EXTRACTION, BILATERAL    . KNEE SURGERY     right knee   . PORTA CATH INSERTION N/A 09/04/2018   Procedure: PORTA CATH INSERTION;  Surgeon: Algernon Huxley, MD;  Location: [facility];  Service: Cardiovascular;  Laterality: N/A;  . TONSILLECTOMY    . VULVA SURGERY      SOCIAL HISTORY:   Social History   Tobacco Use  . Smoking status: Former Smoker    Packs/day: 1.00    Years: 20.00    Pack years: 20.00    Types: Cigarettes    Last attempt to quit: 08/29/1993    Years since quitting: 25.0  . Smokeless tobacco: Never Used  Substance Use  Topics  . Alcohol use: Not Currently    FAMILY HISTORY:   Family History  Problem Relation Age of Onset  . Alzheimer's disease Mother   . Heart disease Father     DRUG ALLERGIES:  No Known Allergies  REVIEW OF SYSTEMS:   Review of Systems  Constitutional: Positive for chills and fever.  HENT: Negative for congestion and sore throat.   Eyes: Negative for blurred vision and double vision.  Respiratory: Negative for cough, sputum production and shortness of breath.   Cardiovascular: Negative for chest pain, palpitations and leg swelling.  Gastrointestinal: Positive for abdominal pain and constipation. Negative for nausea and vomiting.  Genitourinary: Positive for frequency and urgency. Negative for dysuria and flank pain.  Musculoskeletal: Negative for falls and myalgias.  Neurological: Negative for dizziness and headaches.  Psychiatric/Behavioral: Negative for depression. The patient is not nervous/anxious.       MEDICATIONS AT HOME:   Prior to Admission medications   Medication Sig Start Date End Date Taking? Authorizing Provider  amLODipine (NORVASC) 2.5 MG tablet Take 2.5 mg by mouth daily.   Yes [provider]  atenolol (TENORMIN) 100 MG tablet Take 100 mg by mouth daily.   Yes [provider]  benazepril (LOTENSIN) 40 MG tablet Take 40 mg by mouth daily.   Yes [provider]  dexamethasone (DECADRON) 4 MG tablet Take 2 tablets (8 mg total) by mouth daily. Start the day after chemotherapy for 2 days. 09/03/18  Yes Earlie Server, MD  doxazosin (CARDURA) 8 MG tablet Take 8 mg by mouth daily.   Yes [provider]  fentaNYL (DURAGESIC - DOSED MCG/HR) 25 MCG/HR patch Place 1 patch (25 mcg total) onto the skin every 3 (three) days. 09/12/18  Yes Earlie Server, MD  lidocaine-prilocaine (EMLA) cream Apply to affected area once 09/03/18  Yes Earlie Server, MD  ondansetron (ZOFRAN) 8 MG tablet Take 1 tablet (8 mg total) by mouth 2 (two) times daily as needed for  refractory nausea / vomiting. Start on day 3 after chemo. 09/03/18  Yes Earlie Server, MD  polyethylene glycol [facility]) packet Take 17 g by mouth daily as needed for mild constipation or moderate constipation. 08/29/18  Yes Earlie Server, MD  pravastatin (PRAVACHOL) 40 MG tablet Take 40 mg by mouth daily.   Yes [provider]  prochlorperazine (COMPAZINE) 10 MG tablet Take 1 tablet (10 mg total) by mouth every 6 (six) hours as needed (Nausea or vomiting). 09/03/18  Yes Earlie Server, MD  senna (SENOKOT) 8.6 MG TABS tablet Take 2 tablets (17.2 mg total) by mouth daily. 08/29/18  Yes Earlie Server, MD  warfarin (COUMADIN) 1 MG tablet Take 1 tablet (1 mg total) by mouth daily. Patient taking differently: Take 3 mg by mouth daily.  08/29/18  Yes Earlie Server, MD  oxyCODONE (ROXICODONE) 5 MG immediate release tablet Take 1 tablet (5 mg total) by mouth every 4 (four) hours as needed for moderate pain or severe pain. Patient not taking: Reported on 09/13/2018 09/12/18   Earlie Server, MD      VITAL SIGNS:  Blood pressure (!) 109/43, pulse 70, temperature 98.1 F (36.7 C), temperature source Oral, resp. rate 19, height 5\' 2"  (1.575 m), weight 49.7 kg, SpO2 92 %.  PHYSICAL EXAMINATION:  Physical Exam  GENERAL:  77 y.o.-year-old patient lying in the bed with no acute distress.  EYES: Pupils equal, round, reactive to light and accommodation. No scleral icterus. Extraocular muscles intact.  HEENT: Head atraumatic, normocephalic. Oropharynx and nasopharynx clear.  NECK: Supple, no jugular venous distention. No thyroid enlargement, no tenderness.  LUNGS: Normal breath sounds bilaterally, no wheezing, rales, rhonchi or crepitation. No use of accessory muscles of respiration.  CARDIOVASCULAR: S1, S2 normal. No murmurs, rubs, or gallops.  ABDOMEN: +suprapubic abdominal pain, soft and nondistended. Bowel sounds present. No organomegaly or mass.  GENITOURINARY: +tumors present in vulvar region with evidence of previous  surgery. EXTREMITIES: No pedal edema, cyanosis, or clubbing. No CVA tenderness. NEUROLOGIC: Cranial nerves II through XII are intact. Muscle strength 5/5 in all extremities. Sensation intact. Gait not checked.  PSYCHIATRIC: The patient is alert and oriented x 3.  SKIN: No obvious rash, lesion, or ulcer.   LABORATORY PANEL:   CBC Recent Labs  Lab 09/13/18 0858  WBC 10.7  HGB 10.8*  HCT 32.1*  PLT 193   ------------------------------------------------------------------------------------------------------------------  Chemistries  Recent Labs  Lab 09/13/18 0858  NA 136  K 4.2  CL 105  CO2 24  GLUCOSE 121*  BUN 31*  CREATININE 0.94  CALCIUM 8.6*  AST 15  ALT 10  ALKPHOS 67  BILITOT 0.7   ------------------------------------------------------------------------------------------------------------------  Cardiac Enzymes No results for input(s): TROPONINI in the last 168 hours. ------------------------------------------------------------------------------------------------------------------  RADIOLOGY:  Dg Chest 1 View  Result Date: 09/13/2018 CLINICAL DATA:  fever and weakness onset  2-3 days ago. Denies CP, SOB or cough at this time. Patient reports recent dx of vulvar ca that was due to begin chemo treatment for yesterday, but was unable to begin because she was too hypotensive. Hx HTN, port-a-cath insertion. Former smoker. EXAM: CHEST  1 VIEW COMPARISON:  none FINDINGS: Lungs are clear. Right IJ power injectable port catheter to the distal SVC. No pneumothorax. Heart size upper limits normal. Retrocardiac double density presumably hiatal hernia. Aortic Atherosclerosis (ICD10-170.0). No effusion. Visualized bones unremarkable. IMPRESSION: No acute cardiopulmonary disease. Electronically Signed   By: Lucrezia Europe M.D.   On: 09/13/2018 09:37      IMPRESSION AND PLAN:   Fever- likely 2/2 to UTI given patient's urinary frequency, malodorous urine, and suprapubic abdominal  pain. Patient has urinary incontinence at baseline. Not meeting sepsis criteria and no CVA tenderness to suggest pyelonephritis. WBC 10.7, lactic acid 0.9. CXR negative. She did have a port-a-cath placed on 09/04/18, but site does not look infected. - received vancomycin, cefepime, and flagyl in the ED. Will narrow to just cefepime for now. - unable to obtain urine sample due to patient's tumors and anatomy - blood cultures pending - will give a 1L NS bolus and start on MIVFs - check procalcitonin - tylenol prn fevers - PT/OT consult  Vulvar cancer- initially diagnosed in 2007 and had surgery at that time. Diagnosed with recurrent vulvar cancer ~3 weeks ago. Follows with Dr. Tasia Catchings. Supposed to get first round of chemo 9/13, but BP was too low. Has f/u appointment 9/16. - oncology consult - local wound care - continue home fentanyl patch  Paroxysmal atrial fibrillation- in NSR here - continue home coumadin  Hypertension- having some hypotension recently. BP 109/43 in the ED. - continue home atenolol - hold home norvasc, benazepril, and doxazosin until BPs improve  Normocytic anemia- Hgb 10.8 - check anemia panel - trend CBC  Hyperlipidemia- stable - continue home pravastatin  Constipation - continue home miralax and senokot  All the records are reviewed and case discussed with ED provider. Management plans discussed with the patient, family and they are in agreement.  CODE STATUS: DNI  TOTAL TIME TAKING CARE OF THIS PATIENT: 40 minutes.    Berna Spare Roy Tokarz M.D on 09/13/2018 at 12:21 PM  Between 7am to 6pm - Pager - [phone]  After 6pm go to [email or web address] - Proofreader  [facility]  Office  [phone]  CC: Primary care physician; Patient, No Pcp Per   Note: This dictation was prepared with Dragon dictation along with smaller phrase technology. Any transcriptional errors that result from this process are unintentional.

## 2018-09-13 NOTE — Progress Notes (Signed)
ANTICOAGULATION CONSULT NOTE - Initial Consult  Pharmacy Consult for Warfarin dosing  Indication: atrial fibrillation  No Known Allergies  Patient Measurements: Height: 5\' 2"  (157.5 cm) Weight: 109 lb 7.7 oz (49.7 kg) IBW/kg (Calculated) : 50.1  Vital Signs: Temp: 98.1 F (36.7 C) (09/14 1155) Temp Source: Oral (09/14 1155) BP: 109/43 (09/14 1130) Pulse Rate: 70 (09/14 1130)  Labs: Recent Labs    09/12/18 0830 09/13/18 0858  HGB 11.3* 10.8*  HCT 34.0* 32.1*  PLT 224 193  LABPROT 19.3* 21.1*  INR 1.64 1.84  CREATININE 1.13* 0.94    Estimated Creatinine Clearance: 41.2 mL/min (by C-G formula based on SCr of 0.94 mg/dL).   Medical History: Past Medical History:  Diagnosis Date  . A-fib ([place])   . Cancer (HCC)    vulva   . Depression   . Diverticulitis   . Hyperlipemia   . Hypertension   . Vulva cancer (HCC)     Medications:  Scheduled:  . warfarin  3 mg Oral ONCE-1800  . Warfarin - Pharmacist Dosing Inpatient   Does not apply q1800   Infusions:  . metronidazole Stopped (09/13/18 1117)  . [START ON 09/14/2018] vancomycin      Assessment: 77 yo female on warfarin 3mg  daily for a-fib.  Pharmacy consulted to order and monitor warfarin.   9/14  INR = 1.84   Goal of Therapy:  INR 2-3 Monitor platelets by anticoagulation protocol: Yes   Plan:  Warfarin 3mg  at 18:00 tonight. INR daily.  Pharmacy will follow and adjust dose as per protocol.   Virginia Murray, [facility] 09/13/2018,1:03 PM

## 2018-09-13 NOTE — ED Notes (Signed)
Called and spoke Sharyn Lull and informed her that pt will be going to room 101.

## 2018-09-13 NOTE — Progress Notes (Signed)
CODE SEPSIS - PHARMACY COMMUNICATION  **Broad Spectrum Antibiotics should be administered within 1 hour of Sepsis diagnosis**  Time Code Sepsis Called/Page Received: 08:59  Antibiotics Ordered: Vancomycin/Cefepime/Metronidazole   Time of 1st antibiotic administration: 10:00  Olivia Canter [facility] Clinical Pharmacist  09/13/2018  10:12 AM

## 2018-09-13 NOTE — ED Notes (Signed)
Attempted to call Sharyn Lull, pt daughter in law, per pt request. No answer and voice mail is full.

## 2018-09-13 NOTE — Evaluation (Signed)
Physical Therapy Evaluation Patient Details Name: Virginia Murray MRN: [id number] DOB: [birth year] Today's Date: 09/13/2018   History of Present Illness  Patient is a 77 y/o female recently diagnosed with recurrent vulvular cancer, admitted with fever and chills with elevated WBC, being worked up for UTI.   Clinical Impression  Patient is a 77 y/o female that has been living with her son at home, was recent diagnosed with recurrent vulvular cancer. She is admitted with fever and chills, but had previously been independent with mobility. She was independent with transfers and forward/retro mobility in this session. She was able to ambulate into the hallway holding an IV pole, though this appeared to be more preferential than necessary as she was initially ambulating without assistance. Patient appears at her baseline with no needs identified in this session.     Follow Up Recommendations No PT follow up    Equipment Recommendations       Recommendations for Other Services       Precautions / Restrictions Precautions Precautions: None Restrictions Weight Bearing Restrictions: No      Mobility  Bed Mobility Overal bed mobility: Independent             General bed mobility comments: No deficits identified.   Transfers Overall transfer level: Independent               General transfer comment: No deficits identified.   Ambulation/Gait Ambulation/Gait assistance: Modified independent (Device/Increase time) Gait Distance (Feet): 200 Feet Assistive device: IV Pole Gait Pattern/deviations: WFL(Within Functional Limits)   Gait velocity interpretation: <1.8 ft/sec, indicate of risk for recurrent falls General Gait Details: Appropriate forward and retro motion demonstrated, no loss of balance, utilized IV pole as she did not want PT following her.   Stairs            Wheelchair Mobility    Modified Rankin (Stroke Patients Only)       Balance Overall  balance assessment: Modified Independent                                           Pertinent Vitals/Pain Pain Assessment: (Patient was irritable throughout encounter, patient discussed with RN that the pain patch applied was not sufficient, RN had contacted MD already)    Home Living Family/patient expects to be discharged to:: Private residence Living Arrangements: Children Available Help at Discharge: Family Type of Home: House                Prior Function Level of Independence: Independent         Comments: Patient was irritable throughout session and did not offer insight into PLOF aside from living with son.      Hand Dominance        Extremity/Trunk Assessment   Upper Extremity Assessment Upper Extremity Assessment: Overall WFL for tasks assessed    Lower Extremity Assessment Lower Extremity Assessment: Overall WFL for tasks assessed       Communication   Communication: No difficulties  Cognition Arousal/Alertness: Awake/alert Behavior During Therapy: WFL for tasks assessed/performed;Agitated Overall Cognitive Status: Within Functional Limits for tasks assessed                                        General Comments General comments (skin integrity, edema, etc.):  IV port in R clavicular area.    Exercises     Assessment/Plan    PT Assessment Patent does not need any further PT services  PT Problem List         PT Treatment Interventions      PT Goals (Current goals can be found in the Care Plan section)  Acute Rehab PT Goals Patient Stated Goal: To get pain medications. PT Goal Formulation: With patient Time For Goal Achievement: 09/27/18 Potential to Achieve Goals: Good    Frequency     Barriers to discharge        Co-evaluation               AM-PAC PT "6 Clicks" Daily Activity  Outcome Measure Difficulty turning over in bed (including adjusting bedclothes, sheets and blankets)?:  None Difficulty moving from lying on back to sitting on the side of the bed? : None Difficulty sitting down on and standing up from a chair with arms (e.g., wheelchair, bedside commode, etc,.)?: None Help needed moving to and from a bed to chair (including a wheelchair)?: None Help needed walking in hospital room?: None Help needed climbing 3-5 steps with a railing? : None 6 Click Score: 24    End of Session   Activity Tolerance: Patient tolerated treatment well Patient left: in bed;with call bell/phone within reach Nurse Communication: Mobility status PT Visit Diagnosis: Muscle weakness (generalized) (M62.81)    Time: 1771-1657 PT Time Calculation (min) (ACUTE ONLY): 10 min    Charges:   PT Evaluation $PT Eval Low Complexity: 1 Low          Royce Macadamia PT, DPT, CSCS    09/13/2018, 3:57 PM

## 2018-09-13 NOTE — Progress Notes (Signed)
Family Meeting Note  Advance Directive:yes  Today a meeting took place with the Patient.  Patient is able to participate.   The following clinical team members were present during this meeting:MD  The following were discussed:Patient's diagnosis: , Patient's progosis: Unable to determine and Goals for treatment: DNI  Additional follow-up to be provided: prn  Time spent during discussion:20 minutes  Evette Doffing, MD

## 2018-09-13 NOTE — ED Notes (Signed)
Adonis Brook, Float RN called to take pt to the floor

## 2018-09-13 NOTE — ED Notes (Signed)
Pt requested to eat... EDP says yes she can.

## 2018-09-13 NOTE — ED Notes (Signed)
Pt returned form XRAY

## 2018-09-13 NOTE — Progress Notes (Signed)
Pharmacy Antibiotic Note  Virginia Murray is a 77 y.o. female admitted on 09/13/2018 with sepsis.  Pharmacy has been consulted for Vancomycin and Cefepime dosing.  Plan:   Vancomycin 1 g IV x 1 dose in ED at 10:00.   Begin Vancomycin 750 IV every 24 hours on 9/15 at 06:00 (~ 18 hour after initial dose for stacked dosing).  Goal trough 15-20 mcg/mL.   Cefepime 2g IV every 12 hours.      Height: 5\' 2"  (157.5 cm) Weight: 109 lb 7.7 oz (49.7 kg) IBW/kg (Calculated) : 50.1  Temp (24hrs), Avg:99.8 F (37.7 C), Min:98.1 F (36.7 C), Max:102.6 F (39.2 C)  Recent Labs  Lab 09/12/18 0830 09/13/18 0858  WBC 10.5 10.7  CREATININE 1.13* 0.94  LATICACIDVEN  --  0.9    Estimated Creatinine Clearance: 41.2 mL/min (by C-G formula based on SCr of 0.94 mg/dL).    No Known Allergies  Antimicrobials this admission: Vancomycin 9/14 >>  Cefepime 9/14 >>  Metronidazole 9/14 >>   Dose adjustments this admission: N/A  Microbiology results: 9/14 BCx: pending  Thank you for allowing pharmacy to be a part of this patient's care.  Olivia Canter, [facility] 09/13/2018 12:24 PM

## 2018-09-14 ENCOUNTER — Inpatient Hospital Stay (HOSPITAL_COMMUNITY)
Admit: 2018-09-14 | Discharge: 2018-09-14 | Disposition: A | Discharge status: Home / Self Care | Payer: Medicare Other | Attending: Internal Medicine | Admitting: Internal Medicine

## 2018-09-14 DIAGNOSIS — I480 Paroxysmal atrial fibrillation: Secondary | ICD-10-CM

## 2018-09-14 DIAGNOSIS — R7881 Bacteremia: Secondary | ICD-10-CM

## 2018-09-14 DIAGNOSIS — B955 Unspecified streptococcus as the cause of diseases classified elsewhere: Secondary | ICD-10-CM

## 2018-09-14 DIAGNOSIS — Z87891 Personal history of nicotine dependence: Secondary | ICD-10-CM

## 2018-09-14 DIAGNOSIS — R5081 Fever presenting with conditions classified elsewhere: Secondary | ICD-10-CM

## 2018-09-14 DIAGNOSIS — C519 Malignant neoplasm of vulva, unspecified: Secondary | ICD-10-CM

## 2018-09-14 DIAGNOSIS — G893 Neoplasm related pain (acute) (chronic): Secondary | ICD-10-CM

## 2018-09-14 DIAGNOSIS — D649 Anemia, unspecified: Secondary | ICD-10-CM

## 2018-09-14 LAB — BASIC METABOLIC PANEL
Anion gap: 4 — ABNORMAL LOW (ref 5–15)
BUN: 22 mg/dL (ref 8–23)
CALCIUM: 7.3 mg/dL — AB (ref 8.9–10.3)
CHLORIDE: 111 mmol/L (ref 98–111)
CO2: 24 mmol/L (ref 22–32)
CREATININE: 0.86 mg/dL (ref 0.44–1.00)
GFR calc non Af Amer: >60 mL/min (ref >60)
Glucose, Bld: 95 mg/dL (ref 70–99)
Potassium: 3.8 mmol/L (ref 3.5–5.1)
SODIUM: 139 mmol/L (ref 135–145)

## 2018-09-14 LAB — CBC
HCT: 28.9 % — ABNORMAL LOW (ref 35.0–47.0)
Hemoglobin: 9.7 g/dL — ABNORMAL LOW (ref 12.0–16.0)
MCH: 27.2 pg (ref 26.0–34.0)
MCHC: 33.4 g/dL (ref 32.0–36.0)
MCV: 81.5 fL (ref 80.0–100.0)
PLATELETS: 154 10*3/uL (ref 150–440)
RBC: 3.55 MIL/uL — AB (ref 3.80–5.20)
RDW: 16.1 % — ABNORMAL HIGH (ref 11.5–14.5)
WBC: 8 10*3/uL (ref 3.6–11.0)

## 2018-09-14 LAB — PROTIME-INR
INR: 2.73
Prothrombin Time: 28.7 seconds — ABNORMAL HIGH (ref 11.4–15.2)

## 2018-09-14 MED ORDER — CYANOCOBALAMIN 1000 MCG/ML IJ SOLN
1000.0000 ug | Freq: Once | INTRAMUSCULAR | Status: AC
  Administered 2018-09-14: 1000 ug via INTRAMUSCULAR
  Filled 2018-09-14: qty 1

## 2018-09-14 MED ORDER — VITAMIN B-12 1000 MCG PO TABS
1000.0000 ug | ORAL_TABLET | Freq: Every day | ORAL | Status: DC
  Administered 2018-09-15 – 2018-09-17 (×3): 1000 ug via ORAL
  Filled 2018-09-14 (×3): qty 1

## 2018-09-14 NOTE — Progress Notes (Signed)
PHARMACY - PHYSICIAN COMMUNICATION CRITICAL VALUE ALERT - BLOOD CULTURE IDENTIFICATION (BCID)  Virginia Murray is an 77 y.o. female who presented to [facility] on 09/13/2018 with a chief complaint of chills and feeling sick.  Assessment:  Tmax 102.6, tachycardic, PCT 0.11, UA??, UCX pending, 3/4 aearobic/anaerobic BCID strep species  Name of physician (or Provider) Contacted: Amelia Jo  Current antibiotics: Cefepime 2g IV q12h  Changes to prescribed antibiotics recommended:  Patient is on recommended antibiotics - No changes needed will follow-up on finalized cultures.  Results for orders placed or performed during the hospital encounter of 09/13/18  Blood Culture ID Panel (Reflexed) (Collected: 09/13/2018  8:55 AM)  Result Value Ref Range   Enterococcus species NOT DETECTED NOT DETECTED   Listeria monocytogenes NOT DETECTED NOT DETECTED   Staphylococcus species NOT DETECTED NOT DETECTED   Staphylococcus aureus NOT DETECTED NOT DETECTED   Streptococcus species DETECTED (A) NOT DETECTED   Streptococcus agalactiae NOT DETECTED NOT DETECTED   Streptococcus pneumoniae NOT DETECTED NOT DETECTED   Streptococcus pyogenes NOT DETECTED NOT DETECTED   Acinetobacter baumannii NOT DETECTED NOT DETECTED   Enterobacteriaceae species NOT DETECTED NOT DETECTED   Enterobacter cloacae complex NOT DETECTED NOT DETECTED   Escherichia coli NOT DETECTED NOT DETECTED   Klebsiella oxytoca NOT DETECTED NOT DETECTED   Klebsiella pneumoniae NOT DETECTED NOT DETECTED   Proteus species NOT DETECTED NOT DETECTED   Serratia marcescens NOT DETECTED NOT DETECTED   Haemophilus influenzae NOT DETECTED NOT DETECTED   Neisseria meningitidis NOT DETECTED NOT DETECTED   Pseudomonas aeruginosa NOT DETECTED NOT DETECTED   Candida albicans NOT DETECTED NOT DETECTED   Candida glabrata NOT DETECTED NOT DETECTED   Candida krusei NOT DETECTED NOT DETECTED   Candida parapsilosis NOT DETECTED NOT DETECTED   Candida  tropicalis NOT DETECTED NOT DETECTED   Tobie Lords, PharmD, BCPS Clinical Pharmacist 09/14/2018

## 2018-09-14 NOTE — Progress Notes (Signed)
*  PRELIMINARY RESULTS* Echocardiogram 2D Echocardiogram has been performed.  Virginia Murray 09/14/2018, 2:16 PM

## 2018-09-14 NOTE — Progress Notes (Signed)
[place] [place] NAME: Virginia Murray    MR#:  [id number]  DATE OF BIRTH:  1941-12-08  SUBJECTIVE:   Patient feels better this morning.  Blood cultures are growing up to coccus species Her husband passed away a few months ago she is still grieving  REVIEW OF SYSTEMS:    Review of Systems  Constitutional: Negative for fever, chills weight loss HENT: Negative for ear pain, nosebleeds, congestion, facial swelling, rhinorrhea, neck pain, neck stiffness and ear discharge.   Respiratory: Negative for cough, shortness of breath, wheezing  Cardiovascular: Negative for chest pain, palpitations and leg swelling.  Gastrointestinal: Negative for heartburn, abdominal pain, vomiting, diarrhea or consitpation Genitourinary: Negative for dysuria, urgency, frequency, hematuria Musculoskeletal: Negative for back pain or joint pain Neurological: Negative for dizziness, seizures, syncope, focal weakness,  numbness and headaches.  Hematological: Does not bruise/bleed easily.  Psychiatric/Behavioral: Negative for hallucinations, confusion, dysphoric mood    Tolerating Diet: yes      DRUG ALLERGIES:  No Known Allergies  VITALS:  Blood pressure 99/72, pulse 65, temperature 99.1 F (37.3 C), temperature source Oral, resp. rate 17, height 5\' 3"  (1.6 m), weight 55.3 kg, SpO2 98 %.  PHYSICAL EXAMINATION:  Constitutional: Appears well-developed and well-nourished. No distress. HENT: Normocephalic. Marland Kitchen Oropharynx is clear and moist.  Eyes: Conjunctivae and EOM are normal. PERRLA, no scleral icterus.  Neck: Normal ROM. Neck supple. No JVD. No tracheal deviation. CVS: RRR, S1/S2 +, no murmurs, no gallops, no carotid bruit.  Pulmonary: Effort and breath sounds normal, no stridor, rhonchi, wheezes, rales.  Abdominal: Soft. BS +,  no distension, tenderness, rebound or guarding.  Musculoskeletal: Normal range of motion. No edema and no tenderness.  Neuro:  Alert. CN 2-12 grossly intact. No focal deficits. Skin: Skin is warm and dry. No rash noted.\  She has a port Psychiatric: Normal mood and affect.      LABORATORY PANEL:   CBC Recent Labs  Lab 09/14/18 0528  WBC 8.0  HGB 9.7*  HCT 28.9*  PLT 154   ------------------------------------------------------------------------------------------------------------------  Chemistries  Recent Labs  Lab 09/13/18 0858 09/14/18 0528  NA 136 139  K 4.2 3.8  CL 105 111  CO2 24 24  GLUCOSE 121* 95  BUN 31* 22  CREATININE 0.94 0.86  CALCIUM 8.6* 7.3*  AST 15  --   ALT 10  --   ALKPHOS 67  --   BILITOT 0.7  --    ------------------------------------------------------------------------------------------------------------------  Cardiac Enzymes No results for input(s): TROPONINI in the last 168 hours. ------------------------------------------------------------------------------------------------------------------  RADIOLOGY:  Dg Chest 1 View  Result Date: 09/13/2018 CLINICAL DATA:  fever and weakness onset 2-3 days ago. Denies CP, SOB or cough at this time. Patient reports recent dx of vulvar ca that was due to begin chemo treatment for yesterday, but was unable to begin because she was too hypotensive. Hx HTN, port-a-cath insertion. Former smoker. EXAM: CHEST  1 VIEW COMPARISON:  none FINDINGS: Lungs are clear. Right IJ power injectable port catheter to the distal SVC. No pneumothorax. Heart size upper limits normal. Retrocardiac double density presumably hiatal hernia. Aortic Atherosclerosis (ICD10-170.0). No effusion. Visualized bones unremarkable. IMPRESSION: No acute cardiopulmonary disease. Electronically Signed   By: Lucrezia Europe M.D.   On: 09/13/2018 09:37     ASSESSMENT AND PLAN:   77 year old female with vulvar cancer receiving chemotherapy, PAF and essential hypertension who presents to the ER with fever and chills.   1.  Sepsis: Patient  presents with fever as well as  tachypnea Blood cultures are positive for strep species Continue cefepime ID consultation requested as patient has a port Echocardiogram to evaluate for endocarditis Repeat blood cultures Follow-up on final blood cultures   2.  PAF heart rate is controlled on atenolol Pharmacy dosing Coumadin  3.  History of vulvar cancer: Continue pain medications Oncology consultation requested  4.  Central hypertension: Continue atenolol  5.  Iron deficiency anemia and B12 deficiency: Start B12 supplementation   Management plans discussed with the patient and she is in agreement.  CODE STATUS: DNR  TOTAL TIME TAKING CARE OF THIS PATIENT: 30 minutes.     POSSIBLE D/C 2 days, DEPENDING ON CLINICAL CONDITION.   Orlena Garmon M.D on 09/14/2018 at 10:39 AM  Between 7am to 6pm - Pager - [phone] After 6pm go to [email or web address] - password [email or web address] [facility]  Office  [phone]  CC: Primary care physician; Patient, No Pcp Per  Note: This dictation was prepared with Dragon dictation along with smaller phrase technology. Any transcriptional errors that result from this process are unintentional.

## 2018-09-14 NOTE — Progress Notes (Signed)
ANTICOAGULATION CONSULT NOTE - Follow UP Consult  Pharmacy Consult for Warfarin dosing  Indication: atrial fibrillation  No Known Allergies  Patient Measurements: Height: 5\' 3"  (160 cm) Weight: 121 lb 14.4 oz (55.3 kg) IBW/kg (Calculated) : 52.4  Vital Signs: Temp: 99.1 F (37.3 C) (09/15 0554) Temp Source: Oral (09/15 0554) BP: 99/72 (09/15 0554) Pulse Rate: 65 (09/15 0554)  Labs: Recent Labs    09/12/18 0830 09/13/18 0858 09/14/18 0528  HGB 11.3* 10.8* 9.7*  HCT 34.0* 32.1* 28.9*  PLT 224 193 154  LABPROT 19.3* 21.1* 28.7*  INR 1.64 1.84 2.73  CREATININE 1.13* 0.94 0.86    Estimated Creatinine Clearance: 47.5 mL/min (by C-G formula based on SCr of 0.86 mg/dL).   Medical History: Past Medical History:  Diagnosis Date  . A-fib ([place])   . Cancer (HCC)    vulva   . Depression   . Diverticulitis   . Hyperlipemia   . Hypertension   . Vulva cancer (HCC)     Medications:  Scheduled:  . atenolol  100 mg Oral Daily  . fentaNYL  25 mcg Transdermal Q72H  . pravastatin  40 mg Oral q1800  . senna  2 tablet Oral Daily  . Warfarin - Pharmacist Dosing Inpatient   Does not apply q1800   Infusions:  . sodium chloride 75 mL/hr at 09/14/18 0348  . ceFEPime (MAXIPIME) IV 2 g (09/13/18 2153)    Assessment: 77 yo female on warfarin 3mg  daily for a-fib.  Pharmacy consulted to order and monitor warfarin.   9/14  INR = 1.84; 3 mg  9/15: INR: 2.73; hold   Goal of Therapy:  INR 2-3 Monitor platelets by anticoagulation protocol: Yes   Plan:  Significant increase in INR. Will hold this evening's dose of warfarin.  Pharmacy will follow and adjust dose as per protocol.   Turhan Chill D, RPH 09/14/2018,7:30 AM

## 2018-09-14 NOTE — Progress Notes (Signed)
Family Meeting Note  Advance Directive:no  Today a meeting took place with the Patient.  The following clinical team members were present during this meeting:MD  The following were discussed:Patient's diagnosis: sepsis Vulvar cancer, Patient's progosis: > 12 months and Goals for treatment: DNR  Additional follow-up to be provided: DNR order writted in chart and signed Patient does not want chaplain consult at this time for AD  Time spent during discussion:16 minutes  Virginia Hollars, MD

## 2018-09-14 NOTE — Consult Note (Signed)
Hematology/Oncology Consult note [facility] Telephone:([phone] Fax:([phone]) [phone]  Patient Care Team: Patient, No Pcp Per as PCP - General (General Practice)   Name of the patient: Virginia Murray  [id number]  Jul 16, 1941    Reason for consult: h/o vulvar cancer admitted for fever   Requesting physician: Dr. Benjie Karvonen  Date of visit: 09/14/2018    History of presenting illness-patient is a 77 year old female was initially diagnosed with vulvar cancer back in 2007 status post glottic pneumonectomy and radiation therapy.  She recently moved from [place] to [place] and was seen by Dr. Tasia Catchings as an outpatient.  She has not had any recurrence and chemotherapy was recommended.  Plan was to start weekly carbotaxol in about 1 week's time and treatment was held last visit due to hypotension and her blood pressure medications were adjusted at that time.  She also has a history of atrial fibrillation and is on chronic Coumadin therapy.  She is here to establish her primary care here at [facility].  Patient presented to the hospital with symptoms of fever and chills and fatigue.  Patient did complain of urinary frequency as well as burning  In the ER patient was found to have temperature 102.6.  Chest x-ray was unremarkable.  She was started on empiric IV antibiotics.  She was also found to be anemic with a hemoglobin of 9.7 and anemia work-up revealed B12 deficiency for which she has been given 1 dose of B12.  Iron studies showed anemia of chronic disease may be some component of iron deficiency as well.  Blood cultures 2 out of 2 showed Streptococcus.  Echocardiogram has been done today and reports are currently pending.  She is currently on IV cefepime. Feels better since admission. Still reports vulvar pain  ECOG PS- 2  Pain scale- 4   Review of systems- Review of Systems  Constitutional: Positive for malaise/fatigue.  Genitourinary:       Vulvar pain    No  Known Allergies  Patient Active Problem List   Diagnosis Date Noted  . Fever 09/13/2018  . Vulvar cancer ([place]) 09/03/2018  . Goals of care, counseling/discussion 09/03/2018     Past Medical History:  Diagnosis Date  . A-fib ([place])   . Cancer (HCC)    vulva   . Depression   . Diverticulitis   . Hyperlipemia   . Hypertension   . Vulva cancer [facility])      Past Surgical History:  Procedure Laterality Date  . ABDOMINAL HYSTERECTOMY    . CATARACT EXTRACTION, BILATERAL    . KNEE SURGERY     right knee   . PORTA CATH INSERTION N/A 09/04/2018   Procedure: PORTA CATH INSERTION;  Surgeon: Algernon Huxley, MD;  Location: [facility];  Service: Cardiovascular;  Laterality: N/A;  . TONSILLECTOMY    . VULVA SURGERY      Social History   Socioeconomic History  . Marital status: Widowed    Spouse name: Not on file  . Number of children: 2  . Years of education: Not on file  . Highest education level: Not on file  Occupational History  . Not on file  Social Needs  . Financial resource strain: Not on file  . Food insecurity:    Worry: Not on file    Inability: Not on file  . Transportation needs:    Medical: Not on file    Non-medical: Not on file  Tobacco Use  . Smoking  status: Former Smoker    Packs/day: 1.00    Years: 20.00    Pack years: 20.00    Types: Cigarettes    Last attempt to quit: 08/29/1993    Years since quitting: 25.0  . Smokeless tobacco: Never Used  Substance and Sexual Activity  . Alcohol use: Not Currently  . Drug use: Never  . Sexual activity: Not Currently  Lifestyle  . Physical activity:    Days per week: Not on file    Minutes per session: Not on file  . Stress: Not on file  Relationships  . Social connections:    Talks on phone: Not on file    Gets together: Not on file    Attends religious service: Not on file    Active member of club or organization: Not on file    Attends meetings of clubs or organizations: Not on file     Relationship status: Not on file  . Intimate partner violence:    Fear of current or ex partner: Not on file    Emotionally abused: Not on file    Physically abused: Not on file    Forced sexual activity: Not on file  Other Topics Concern  . Not on file  Social History Narrative  . Not on file     Family History  Problem Relation Age of Onset  . Alzheimer's disease Mother   . Heart disease Father      Current Facility-Administered Medications:  .  0.9 %  sodium chloride infusion, , Intravenous, Continuous, Mayo, Pete Pelt, MD, Last Rate: 75 mL/hr at 09/14/18 0348 .  acetaminophen (TYLENOL) tablet 650 mg, 650 mg, Oral, Q6H PRN, 650 mg at 09/13/18 2010 **OR** acetaminophen (TYLENOL) suppository 650 mg, 650 mg, Rectal, Q6H PRN, Mayo, Pete Pelt, MD .  atenolol (TENORMIN) tablet 100 mg, 100 mg, Oral, Daily, Mayo, Pete Pelt, MD, 100 mg at 09/14/18 0905 .  ceFEPIme (MAXIPIME) 2 g in sodium chloride 0.9 % 100 mL IVPB, 2 g, Intravenous, Q12H, Mayo, Pete Pelt, MD, Last Rate: 200 mL/hr at 09/14/18 0903, 2 g at 09/14/18 0903 .  cyanocobalamin ((VITAMIN B-12)) injection 1,000 mcg, 1,000 mcg, Intramuscular, Once, Mody, Sital, MD .  fentaNYL ([place] - dosed mcg/hr) patch 25 mcg, 25 mcg, Transdermal, Q72H, Mayo, Pete Pelt, MD, 25 mcg at 09/13/18 1411 .  ondansetron (ZOFRAN) tablet 4 mg, 4 mg, Oral, Q6H PRN, 4 mg at 09/13/18 1512 **OR** ondansetron (ZOFRAN) injection 4 mg, 4 mg, Intravenous, Q6H PRN, Mayo, Pete Pelt, MD .  oxyCODONE (Oxy IR/ROXICODONE) immediate release tablet 5 mg, 5 mg, Oral, Q4H PRN, Mayo, Pete Pelt, MD, 5 mg at 09/14/18 1308 .  polyethylene glycol (MIRALAX / GLYCOLAX) packet 17 g, 17 g, Oral, Daily PRN, Mayo, Pete Pelt, MD .  pravastatin (PRAVACHOL) tablet 40 mg, 40 mg, Oral, q1800, Mayo, Pete Pelt, MD, 40 mg at 09/14/18 0905 .  prochlorperazine (COMPAZINE) tablet 10 mg, 10 mg, Oral, Q6H PRN, Mayo, Pete Pelt, MD .  senna [facility]) tablet 17.2 mg, 2 tablet, Oral, Daily, Mayo,  Pete Pelt, MD, 17.2 mg at 09/14/18 0905 .  [START ON 09/15/2018] vitamin B-12 (CYANOCOBALAMIN) tablet 1,000 mcg, 1,000 mcg, Oral, Daily, Bettey Costa, MD .  Warfarin - Pharmacist Dosing Inpatient, , Does not apply, q1800, Mayo, Pete Pelt, MD   Physical exam:  Vitals:   09/13/18 1934 09/13/18 2157 09/14/18 0554 09/14/18 1449  BP: (!) 137/44  99/72 (!) 140/43  Pulse: 82  65 61  Resp: 19  17 18  Temp: (!) 102.3 F (39.1 C) 98.7 F (37.1 C) 99.1 F (37.3 C) 98.2 F (36.8 C)  TempSrc: Oral Oral Oral Oral  SpO2: 95%  98% 97%  Weight:      Height:       Physical Exam  Constitutional: She is oriented to person, place, and time.  Thin elderly female in no acute distress  HENT:  Head: Normocephalic and atraumatic.  Eyes: Pupils are equal, round, and reactive to light. EOM are normal.  Neck: Normal range of motion.  Cardiovascular: Normal rate, regular rhythm and normal heart sounds.  Pulmonary/Chest: Effort normal and breath sounds normal.  Abdominal: Soft. Bowel sounds are normal.  Neurological: She is alert and oriented to person, place, and time.  Skin: Skin is warm and dry.       CMP Latest Ref Rng & Units 09/14/2018  Glucose 70 - 99 mg/dL 95  BUN 8 - 23 mg/dL 22  Creatinine 0.44 - 1.00 mg/dL 0.86  Sodium 135 - 145 mmol/L 139  Potassium 3.5 - 5.1 mmol/L 3.8  Chloride 98 - 111 mmol/L 111  CO2 22 - 32 mmol/L 24  Calcium 8.9 - 10.3 mg/dL 7.3(L)  Total Protein 6.5 - 8.1 g/dL -  Total Bilirubin 0.3 - 1.2 mg/dL -  Alkaline Phos 38 - 126 U/L -  AST 15 - 41 U/L -  ALT 0 - 44 U/L -   CBC Latest Ref Rng & Units 09/14/2018  WBC 3.6 - 11.0 K/uL 8.0  Hemoglobin 12.0 - 16.0 g/dL 9.7(L)  Hematocrit 35.0 - 47.0 % 28.9(L)  Platelets 150 - 440 K/uL 154    @IMAGES @  Dg Chest 1 View  Result Date: 09/13/2018 CLINICAL DATA:  fever and weakness onset 2-3 days ago. Denies CP, SOB or cough at this time. Patient reports recent dx of vulvar ca that was due to begin chemo treatment for  yesterday, but was unable to begin because she was too hypotensive. Hx HTN, port-a-cath insertion. Former smoker. EXAM: CHEST  1 VIEW COMPARISON:  none FINDINGS: Lungs are clear. Right IJ power injectable port catheter to the distal SVC. No pneumothorax. Heart size upper limits normal. Retrocardiac double density presumably hiatal hernia. Aortic Atherosclerosis (ICD10-170.0). No effusion. Visualized bones unremarkable. IMPRESSION: No acute cardiopulmonary disease. Electronically Signed   By: Lucrezia Europe M.D.   On: 09/13/2018 09:37    Assessment and plan- Patient is a 77 y.o. female with history of recurrent vulvar cancer.  She has not started chemotherapy yet and presented to the ER with high-grade fever found to have streptococcal bacteremia  1.  Infectious work-up so far reveals 12 2 blood cultures positive for Streptococcus.  She is currently on IV cefepime has been afebrile since her last temperature spike in the ER.  Echocardiogram is been done today and is currently pending. Port could be a possible site of infection and may need to come out. But she also needs to start chemo soon and needs vascular access. I will touch base with Dr. Tasia Catchings regarding this. Further management per primary team  2. Vulvar cancer: treatment on hold until acute sepsis resolves  3. Anemia: likely due to B12 deficiency and anemia of chronic disease. There may be some component of iron deficiency and IV iron can be considered as an outpatient.  4. Neoplasm related pain: continue fentanyl patch and prn oxycodone  Continue supportive management. OOB/OT PT. Dr. Tasia Catchings to see patient in am   Thank you for this kind  referral and the opportunity to participate in the care of this patient   Visit Diagnosis 1. Fever   2. Bacteremia 3. Vulvar cancer  Dr. Randa Evens, MD, MPH [facility] [phone] 09/14/2018  5:01 PM

## 2018-09-14 NOTE — Progress Notes (Signed)
Chaplain followed up with pt from previous on call chaplain. Pt was up bathing. Chaplain asked if this was a good time. Pt said no. Chaplain asked Pt to have nurse Pt chaplain when she would like a visit.    09/14/18 0900  Clinical Encounter Type  Visited With Patient  Visit Type Follow-up  Referral From Chaplain  Spiritual Encounters  Spiritual Needs Emotional

## 2018-09-14 NOTE — Progress Notes (Addendum)
Pt a&ox4, rested during the night, complains of pain during movement, pain medication given q4 with some relief. Pt states pain is stabbing and excruciating with Left side greater than right.  VS stable. Will continue to monitor.

## 2018-09-15 ENCOUNTER — Inpatient Hospital Stay: Payer: Medicare Other

## 2018-09-15 ENCOUNTER — Telehealth: Payer: Self-pay | Admitting: *Deleted

## 2018-09-15 ENCOUNTER — Other Ambulatory Visit: Payer: Self-pay | Admitting: *Deleted

## 2018-09-15 ENCOUNTER — Inpatient Hospital Stay: Payer: Medicare Other | Admitting: Oncology

## 2018-09-15 DIAGNOSIS — Z9079 Acquired absence of other genital organ(s): Secondary | ICD-10-CM

## 2018-09-15 DIAGNOSIS — R7881 Bacteremia: Secondary | ICD-10-CM

## 2018-09-15 DIAGNOSIS — R454 Irritability and anger: Secondary | ICD-10-CM

## 2018-09-15 DIAGNOSIS — B370 Candidal stomatitis: Secondary | ICD-10-CM

## 2018-09-15 DIAGNOSIS — F411 Generalized anxiety disorder: Secondary | ICD-10-CM

## 2018-09-15 DIAGNOSIS — D638 Anemia in other chronic diseases classified elsewhere: Secondary | ICD-10-CM

## 2018-09-15 DIAGNOSIS — R451 Restlessness and agitation: Secondary | ICD-10-CM

## 2018-09-15 DIAGNOSIS — F419 Anxiety disorder, unspecified: Secondary | ICD-10-CM

## 2018-09-15 DIAGNOSIS — Z7189 Other specified counseling: Secondary | ICD-10-CM

## 2018-09-15 DIAGNOSIS — K0889 Other specified disorders of teeth and supporting structures: Secondary | ICD-10-CM

## 2018-09-15 DIAGNOSIS — Z95828 Presence of other vascular implants and grafts: Secondary | ICD-10-CM

## 2018-09-15 DIAGNOSIS — N766 Ulceration of vulva: Secondary | ICD-10-CM

## 2018-09-15 DIAGNOSIS — Z923 Personal history of irradiation: Secondary | ICD-10-CM

## 2018-09-15 LAB — CBC
HEMATOCRIT: 28.6 % — AB (ref 35.0–47.0)
Hemoglobin: 9.6 g/dL — ABNORMAL LOW (ref 12.0–16.0)
MCH: 27.6 pg (ref 26.0–34.0)
MCHC: 33.7 g/dL (ref 32.0–36.0)
MCV: 81.9 fL (ref 80.0–100.0)
PLATELETS: 149 10*3/uL — AB (ref 150–440)
RBC: 3.49 MIL/uL — ABNORMAL LOW (ref 3.80–5.20)
RDW: 16 % — AB (ref 11.5–14.5)
WBC: 8.6 10*3/uL (ref 3.6–11.0)

## 2018-09-15 LAB — ECHOCARDIOGRAM COMPLETE
HEIGHTINCHES: 63 in
WEIGHTICAEL: 1950.4 [oz_av]

## 2018-09-15 LAB — BASIC METABOLIC PANEL
Anion gap: 5 (ref 5–15)
BUN: 16 mg/dL (ref 8–23)
CHLORIDE: 111 mmol/L (ref 98–111)
CO2: 20 mmol/L — ABNORMAL LOW (ref 22–32)
Calcium: 7.3 mg/dL — ABNORMAL LOW (ref 8.9–10.3)
Creatinine, Ser: 0.7 mg/dL (ref 0.44–1.00)
GFR calc Af Amer: >60 mL/min (ref >60)
GFR calc non Af Amer: >60 mL/min (ref >60)
Glucose, Bld: 83 mg/dL (ref 70–99)
POTASSIUM: 3.6 mmol/L (ref 3.5–5.1)
SODIUM: 136 mmol/L (ref 135–145)

## 2018-09-15 LAB — PROTIME-INR
INR: 2.36
Prothrombin Time: 25.6 seconds — ABNORMAL HIGH (ref 11.4–15.2)

## 2018-09-15 LAB — SLIDE CONSULT, PATHOLOGY ARMC

## 2018-09-15 MED ORDER — DIPHENHYDRAMINE HCL 50 MG/ML IJ SOLN
25.0000 mg | Freq: Four times a day (QID) | INTRAMUSCULAR | Status: DC | PRN
  Administered 2018-09-15: 18:00:00 25 mg via INTRAVENOUS
  Filled 2018-09-15 (×2): qty 0.5

## 2018-09-15 MED ORDER — SODIUM CHLORIDE 0.9 % IV SOLN
2.0000 g | INTRAVENOUS | Status: DC
  Administered 2018-09-15 – 2018-09-17 (×3): 2 g via INTRAVENOUS
  Filled 2018-09-15: qty 2
  Filled 2018-09-15: qty 20
  Filled 2018-09-15 (×2): qty 2

## 2018-09-15 MED ORDER — DOCUSATE SODIUM 100 MG PO CAPS
100.0000 mg | ORAL_CAPSULE | Freq: Two times a day (BID) | ORAL | Status: DC
  Filled 2018-09-15 (×3): qty 1

## 2018-09-15 MED ORDER — TRAMADOL HCL 50 MG PO TABS
50.0000 mg | ORAL_TABLET | Freq: Four times a day (QID) | ORAL | Status: DC | PRN
  Administered 2018-09-15 – 2018-09-17 (×4): 50 mg via ORAL
  Filled 2018-09-15 (×4): qty 1

## 2018-09-15 MED ORDER — WARFARIN SODIUM 2 MG PO TABS
2.0000 mg | ORAL_TABLET | Freq: Once | ORAL | Status: AC
  Administered 2018-09-15: 18:00:00 2 mg via ORAL
  Filled 2018-09-15: qty 1

## 2018-09-15 MED ORDER — DIPHENHYDRAMINE HCL 50 MG/ML IJ SOLN
25.0000 mg | Freq: Once | INTRAMUSCULAR | Status: AC
  Administered 2018-09-15: 08:00:00 25 mg via INTRAVENOUS
  Filled 2018-09-15: qty 0.5

## 2018-09-15 MED ORDER — ALPRAZOLAM 0.25 MG PO TABS
0.2500 mg | ORAL_TABLET | Freq: Two times a day (BID) | ORAL | Status: DC | PRN
  Administered 2018-09-15 – 2018-09-17 (×5): 0.25 mg via ORAL
  Filled 2018-09-15 (×5): qty 1

## 2018-09-15 NOTE — Progress Notes (Signed)
[place] at [place] NAME: Virginia Murray    MR#:  [id number]  DATE OF BIRTH:  10-Oct-1941  SUBJECTIVE:   Patient anxious this morning.  Still grieving her husband's death.  No fever chills overnight.  REVIEW OF SYSTEMS:    Review of Systems  Constitutional: Negative for fever, chills weight loss HENT: Negative for ear pain, nosebleeds, congestion, facial swelling, rhinorrhea, neck pain, neck stiffness and ear discharge.   Respiratory: Negative for cough, shortness of breath, wheezing  Cardiovascular: Negative for chest pain, palpitations and leg swelling.  Gastrointestinal: Negative for heartburn, abdominal pain, vomiting, diarrhea or consitpation Genitourinary: Negative for dysuria, urgency, frequency, hematuria Musculoskeletal: Negative for back pain or joint pain Neurological: Negative for dizziness, seizures, syncope, focal weakness,  numbness and headaches.  Hematological: Does not bruise/bleed easily.  Psychiatric/Behavioral: Negative for hallucinations, confusion, dysphoric mood, positive anxiety   Tolerating Diet: yes      DRUG ALLERGIES:  No Known Allergies  VITALS:  Blood pressure (!) 150/53, pulse 63, temperature 98.5 F (36.9 C), temperature source Oral, resp. rate 17, height 5\' 3"  (1.6 m), weight 55.3 kg, SpO2 94 %.  PHYSICAL EXAMINATION:  Constitutional: Appears well-developed and well-nourished. No distress. HENT: Normocephalic. Marland Kitchen Oropharynx is clear and moist.  Eyes: Conjunctivae and EOM are normal. PERRLA, no scleral icterus.  Neck: Normal ROM. Neck supple. No JVD. No tracheal deviation. CVS: RRR, S1/S2 +, no murmurs, no gallops, no carotid bruit.  Pulmonary: Effort and breath sounds normal, no stridor, rhonchi, wheezes, rales.  Abdominal: Soft. BS +,  no distension, tenderness, rebound or guarding.  Musculoskeletal: Normal range of motion. No edema and no tenderness.  Neuro: Alert. CN 2-12 grossly intact.  No focal deficits. Skin: Skin is warm and dry. No rash noted.\  She has a port Psychiatric: Normal mood and affect.      LABORATORY PANEL:   CBC Recent Labs  Lab 09/15/18 0447  WBC 8.6  HGB 9.6*  HCT 28.6*  PLT 149*   ------------------------------------------------------------------------------------------------------------------  Chemistries  Recent Labs  Lab 09/13/18 0858  09/15/18 0447  NA 136   < > 136  K 4.2   < > 3.6  CL 105   < > 111  CO2 24   < > 20*  GLUCOSE 121*   < > 83  BUN 31*   < > 16  CREATININE 0.94   < > 0.70  CALCIUM 8.6*   < > 7.3*  AST 15  --   --   ALT 10  --   --   ALKPHOS 67  --   --   BILITOT 0.7  --   --    < > = values in this interval not displayed.   ------------------------------------------------------------------------------------------------------------------  Cardiac Enzymes No results for input(s): TROPONINI in the last 168 hours. ------------------------------------------------------------------------------------------------------------------  RADIOLOGY:  No results found.   ASSESSMENT AND PLAN:   77 year old female with vulvar cancer receiving chemotherapy, PAF and essential hypertension who presents to the ER with fever and chills.   1.  Sepsis: Patient presented with fever and tachypnea Blood cultures are positive for strep species Continue cefepime ID consultation requested as patient has a port Echocardiogram with normal ejection fraction no signs of vegetation Repeat blood cultures are negative to date Other recommendations after ID consultation   2.  PAF heart rate is controlled on atenolol Pharmacy dosing Coumadin  3.  History of vulvar cancer: Continue pain medications Oncology consultation appreciated  4.  Central hypertension: Continue atenolol  5.  Iron deficiency anemia chronic and B12 deficiency: Continue B12 supplementation Patient may benefit from IV iron as an outpatient   6.   Grieving/anxiety: Patient does not want psychiatry evaluation. Xanax as needed ordered as per her request for anxiety   Management plans discussed with the patient and she is in agreement.  CODE STATUS: DNR  TOTAL TIME TAKING CARE OF THIS PATIENT: 28 minutes.     POSSIBLE D/C 2 days, DEPENDING ON CLINICAL CONDITION.   Kalliopi Coupland M.D on 09/15/2018 at 10:20 AM  Between 7am to 6pm - Pager - [phone] After 6pm go to [email or web address] - password [email or web address] [facility]  Office  [phone]  CC: Primary care physician; Patient, No Pcp Per  Note: This dictation was prepared with Dragon dictation along with smaller phrase technology. Any transcriptional errors that result from this process are unintentional.

## 2018-09-15 NOTE — Progress Notes (Signed)
ANTICOAGULATION CONSULT NOTE - Follow UP Consult  Pharmacy Consult for Warfarin dosing  Indication: atrial fibrillation  No Known Allergies  Patient Measurements: Height: 5\' 3"  (160 cm) Weight: 121 lb 14.4 oz (55.3 kg) IBW/kg (Calculated) : 52.4  Vital Signs: Temp: 98.5 F (36.9 C) (09/16 0351) Temp Source: Oral (09/16 0351) BP: 150/53 (09/16 0351) Pulse Rate: 63 (09/16 0351)  Labs: Recent Labs    09/13/18 0858 09/14/18 0528 09/15/18 0447  HGB 10.8* 9.7* 9.6*  HCT 32.1* 28.9* 28.6*  PLT 193 154 149*  LABPROT 21.1* 28.7* 25.6*  INR 1.84 2.73 2.36  CREATININE 0.94 0.86 0.70    Estimated Creatinine Clearance: 51 mL/min (by C-G formula based on SCr of 0.7 mg/dL).   Medical History: Past Medical History:  Diagnosis Date  . A-fib ([place])   . Cancer (HCC)    vulva   . Depression   . Diverticulitis   . Hyperlipemia   . Hypertension   . Vulva cancer ([facility]CC)     Infusions:  . sodium chloride 75 mL/hr at 09/14/18 0348  . ceFEPime (MAXIPIME) IV Stopped (09/14/18 2239)    Assessment: 77 yo female on warfarin 3mg  daily for a-fib PTA.  Pharmacy consulted to order and monitor warfarin while admitted.  DATE INR Dose   9/14 1.84 3 mg  9/15 2.73 hold  9/16 2.36  Goal of Therapy:  INR 2-3 Monitor platelets by anticoagulation protocol: Yes   Plan:  Significant increase in INR on 9/15, most likely due to start of ABx. INR now trending down. Will order Warfarin 2g X 1 dose tonight.  INRs daily while on ABx per protocol.  Pharmacy will follow and adjust dose as per protocol.   Pernell Dupre, PharmD, BCPS Clinical Pharmacist 09/15/2018 7:37 AM

## 2018-09-15 NOTE — Consult Note (Signed)
Date of Admission:  09/13/2018                 Reason for Consult: Streptococcus bacteremia with port   Referring Provider: Dr. Benjie Karvonen    HPI: Virginia Murray is a 77 y.o. 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 Patient was living in [place] until 4 weeks ago and then moved to [place]. Patient chart reviewed,  Oncologist notes reviewed .Patient was diagnosed with metastatic vulvar cancer in 2007 underwent post radical  vulvectomy and bilateral inguinal lymphnode dissection  2007 with complete radiation to the vulva and right groin August 2007 in [place].  Since then she was on surveillance.  Because of her husband's ill health and eventually passing away with tongue cancer patient did not have good follow-up.  In July 2019 she was found to have a 7 cm ulcerated lesion in the left vulva partially involving the vagina and rectum on examination and this lesion was biopsied on 07/29/2018. She   also the PET scan that showed a perianal ulcerated mass of 8.70X 4.2X 5.3  extending into the gluteal cleft. She was advised chemotherapy. She moved to [facility] and saw  Dr.Yu on 08/29/18. A port was placed on 9/ 5/19. She was scheduled to start chemo on 9/13 and during that visit had low BP and it was postponed. She came to the ED the next day  With fever, chills weakness and suprapubic abdominal pain   with increased urinary frequency   and pain in the vulvar area.  In the ED she had a temperature of 102.6 chest x-ray was negative WBC count was 10.7.  She was started on empiric vancomycin cefepime and Flagyl after sending blood culture and was admitted to the hospital.  I am asked to see the patient's the blood culture is positive for group G Streptococcus.  Patient has been a little confused and had received a CT head today which was essentially normal.  She is on  fentanyl patch.  She also received tramadol before that.       Past Medical History:  Diagnosis Date  . A-fib ([place])   . Cancer ([facility])    vulva   . Depression   . Diverticulitis   . Hyperlipemia   . Hypertension   . Vulva cancer [facility])     Past Surgical History:  Procedure Laterality Date  . ABDOMINAL HYSTERECTOMY    . CATARACT EXTRACTION, BILATERAL    . KNEE SURGERY     right knee   . PORTA CATH INSERTION N/A 09/04/2018   Procedure: PORTA CATH INSERTION;  Surgeon: Algernon Huxley, MD;  Location: [facility];  Service: Cardiovascular;  Laterality: N/A;  . TONSILLECTOMY    . VULVA SURGERY      Social History   Tobacco Use  . Smoking status: Former Smoker    Packs/day: 1.00    Years: 20.00    Pack years: 20.00    Types: Cigarettes    Last attempt to quit: 08/29/1993    Years since quitting: 25.0  . Smokeless tobacco: Never Used  Substance Use Topics  . Alcohol use: Not Currently  . Drug use: Never    Family History  Problem Relation Age of Onset  . Alzheimer's disease Mother   . Heart disease Father      . atenolol  100 mg  Oral Daily  . docusate sodium  100 mg Oral BID  . fentaNYL  25 mcg Transdermal Q72H  . pravastatin  40 mg Oral q1800  . senna  2 tablet Oral Daily  . vitamin B-12  1,000 mcg Oral Daily  . Warfarin - Pharmacist Dosing Inpatient   Does not apply q1800      Abtx:  Anti-infectives (From admission, onward)   Start     Dose/Rate Route Frequency Ordered Stop   09/14/18 0600  vancomycin (VANCOCIN) IVPB 750 mg/150 ml premix  Status:  Discontinued     750 mg 150 mL/hr over 60 Minutes Intravenous Every 24 hours 09/13/18 1247 09/13/18 1422   09/13/18 2200  ceFEPIme (MAXIPIME) 2 g in sodium chloride 0.9 % 100 mL IVPB     2 g 200 mL/hr over 30 Minutes Intravenous Every 12 hours 09/13/18 1526     09/13/18 0915  ceFEPIme (MAXIPIME) 2 g in sodium chloride 0.9 % 100 mL IVPB     2 g 200 mL/hr over 30 Minutes Intravenous  Once 09/13/18 0902  09/13/18 1047   09/13/18 0915  metroNIDAZOLE (FLAGYL) IVPB 500 mg  Status:  Discontinued     500 mg 100 mL/hr over 60 Minutes Intravenous Every 8 hours 09/13/18 0902 09/13/18 1348   09/13/18 0915  vancomycin (VANCOCIN) IVPB 1000 mg/200 mL premix     1,000 mg 200 mL/hr over 60 Minutes Intravenous  Once 09/13/18 0902 09/13/18 1117       Review of Systems: Review of Systems  Constitutional: Positive for chills, fever, malaise/fatigue and weight loss.  HENT: Negative for congestion and sore throat.   Eyes: Negative for blurred vision.  Respiratory: Negative for cough and shortness of breath.   Cardiovascular: Negative for chest pain and leg swelling.  Gastrointestinal: Positive for abdominal pain and nausea.  Genitourinary: Positive for dysuria, frequency and urgency.  Musculoskeletal: Positive for myalgias.  Skin: Negative for itching and rash.  Neurological: Positive for weakness. Negative for dizziness and headaches.  Endo/Heme/Allergies: Negative for polydipsia. Does not bruise/bleed easily.  Psychiatric/Behavioral:       Irritable and anxious      No Known Allergies  OBJECTIVE: Blood pressure (!) 134/54, pulse (!) 59, temperature 98.3 F (36.8 C), resp. rate 18, height 5\' 3"  (1.6 m), weight 54.4 kg, SpO2 94 %.  Physical Exam Constitutional: Pale, thin Eyes: Pupils equal reacting to light ENT: Poor dentition, tongue dry, oral thrush CVS: S1-S2 no murmur Port on the right side of the chest.  No erythema or tenderness RS: Bilateral air entry no crepitation GI: Soft no tenderness GU: Examination of the extremity area shows a deep ulcer on the left side , brawny induration, radiation changes, absent vulva  MSK: Full range of movement, scar on the right knee SKIN: No rash Neurologic: Irritable oriented in place and person but forgetful nonfocal examination Psychiatric: Anxious and irritable Lymphatic: No cervical or axillary lymph nodes palpable.  Inguinal node on the left  palpable  Lab Results CBC    Component Value Date/Time   WBC 8.6 09/15/2018 0447   RBC 3.49 (L) 09/15/2018 0447   HGB 9.6 (L) 09/15/2018 0447   HCT 28.6 (L) 09/15/2018 0447   PLT 149 (L) 09/15/2018 0447   MCV 81.9 09/15/2018 0447   MCH 27.6 09/15/2018 0447   MCHC 33.7 09/15/2018 0447   RDW 16.0 (H) 09/15/2018 0447   LYMPHSABS 0.5 (L) 09/13/2018 0858   MONOABS 0.4 09/13/2018 0858   EOSABS 0.0 09/13/2018 4431  BASOSABS 0.0 09/13/2018 0858    CMP Latest Ref Rng & Units 09/15/2018 09/14/2018 09/13/2018  Glucose 70 - 99 mg/dL 83 95 121(H)  BUN 8 - 23 mg/dL 16 22 31(H)  Creatinine 0.44 - 1.00 mg/dL 0.70 0.86 0.94  Sodium 135 - 145 mmol/L 136 139 136  Potassium 3.5 - 5.1 mmol/L 3.6 3.8 4.2  Chloride 98 - 111 mmol/L 111 111 105  CO2 22 - 32 mmol/L 20(L) 24 24  Calcium 8.9 - 10.3 mg/dL 7.3(L) 7.3(L) 8.6(L)  Total Protein 6.5 - 8.1 g/dL - - 6.2(L)  Total Bilirubin 0.3 - 1.2 mg/dL - - 0.7  Alkaline Phos 38 - 126 U/L - - 67  AST 15 - 41 U/L - - 15  ALT 0 - 44 U/L - - 10      Microbiology: Recent Results (from the past 240 hour(s))  Culture, blood (Routine x 2)     Status: Abnormal (Preliminary result)   Collection Time: 09/13/18  8:55 AM  Result Value Ref Range Status   Specimen Description   Final    BLOOD RH Performed at [facility], [address] [place], [place] [address]    Special Requests   Final    BOTTLES DRAWN AEROBIC AND ANAEROBIC Blood Culture adequate volume Performed at [facility], [address] [place], [place] [address]    Culture  Setup Time   Final    Organism ID to follow IN BOTH AEROBIC AND ANAEROBIC BOTTLES GRAM POSITIVE COCCI CRITICAL RESULT CALLED TO, READ BACK BY AND VERIFIED WITH: SHEEMA HALLAJI ON 09/13/18 AT 2114 [facility]    Culture (A)  Final    STREPTOCOCCUS GROUP G SUSCEPTIBILITIES TO FOLLOW Performed at [facility], [place] [address], [place], [place] [address]    Report Status PENDING  Incomplete  Blood Culture  ID Panel (Reflexed)     Status: Abnormal   Collection Time: 09/13/18  8:55 AM  Result Value Ref Range Status   Enterococcus species NOT DETECTED NOT DETECTED Final   Listeria monocytogenes NOT DETECTED NOT DETECTED Final   Staphylococcus species NOT DETECTED NOT DETECTED Final   Staphylococcus aureus NOT DETECTED NOT DETECTED Final   Streptococcus species DETECTED (A) NOT DETECTED Final    Comment: Not Enterococcus species, Streptococcus agalactiae, Streptococcus pyogenes, or Streptococcus pneumoniae. CRITICAL RESULT CALLED TO, READ BACK BY AND VERIFIED WITH: SHEEMA HALLAJI AT 2114 09/13/18 BY [email or web address]    Streptococcus agalactiae NOT DETECTED NOT DETECTED Final   Streptococcus pneumoniae NOT DETECTED NOT DETECTED Final   Streptococcus pyogenes NOT DETECTED NOT DETECTED Final   Acinetobacter baumannii NOT DETECTED NOT DETECTED Final   Enterobacteriaceae species NOT DETECTED NOT DETECTED Final   Enterobacter cloacae complex NOT DETECTED NOT DETECTED Final   Escherichia coli NOT DETECTED NOT DETECTED Final   Klebsiella oxytoca NOT DETECTED NOT DETECTED Final   Klebsiella pneumoniae NOT DETECTED NOT DETECTED Final   Proteus species NOT DETECTED NOT DETECTED Final   Serratia marcescens NOT DETECTED NOT DETECTED Final   Haemophilus influenzae NOT DETECTED NOT DETECTED Final   Neisseria meningitidis NOT DETECTED NOT DETECTED Final   Pseudomonas aeruginosa NOT DETECTED NOT DETECTED Final   Candida albicans NOT DETECTED NOT DETECTED Final   Candida glabrata NOT DETECTED NOT DETECTED Final   Candida krusei NOT DETECTED NOT DETECTED Final   Candida parapsilosis NOT DETECTED NOT DETECTED Final   Candida tropicalis NOT DETECTED NOT DETECTED Final    Comment: Performed at [facility], [address] [place], [place] [address]  Culture, blood (Routine x 2)     Status: Abnormal (Preliminary result)   Collection Time: 09/13/18  8:59 AM  Result Value Ref Range Status   Specimen Description    Final    BLOOD LAC Performed at [facility], [address] [place], [place] [address]    Special Requests   Final    BOTTLES DRAWN AEROBIC AND ANAEROBIC Blood Culture results may not be optimal due to an excessive volume of blood received in culture bottles Performed at [facility], [address] [place], [place] [address]    Culture  Setup Time   Final    GRAM POSITIVE COCCI AEROBIC BOTTLE ONLY CRITICAL VALUE NOTED.  VALUE IS CONSISTENT WITH PREVIOUSLY REPORTED AND CALLED VALUE. Performed at [facility], [address], [place], [place] [address]    Culture STREPTOCOCCUS GROUP G (A)  Final   Report Status PENDING  Incomplete  Culture, blood (Routine X 2) w Reflex to ID Panel     Status: None (Preliminary result)   Collection Time: 09/14/18  9:33 AM  Result Value Ref Range Status   Specimen Description BLOOD LT [facility]  Final   Special Requests   Final    BOTTLES DRAWN AEROBIC AND ANAEROBIC Blood Culture results may not be optimal due to an excessive volume of blood received in culture bottles   Culture   Final    NO GROWTH < 24 HOURS Performed at [facility], [address] [place], [place] [address]    Report Status PENDING  Incomplete  Culture, blood (Routine X 2) w Reflex to ID Panel     Status: None (Preliminary result)   Collection Time: 09/14/18  9:41 AM  Result Value Ref Range Status   Specimen Description BLOOD RT [facility]  Final   Special Requests   Final    BOTTLES DRAWN AEROBIC AND ANAEROBIC Blood Culture adequate volume   Culture   Final    NO GROWTH < 24 HOURS Performed at [facility], [address], [place], [place] [address]    Report Status PENDING  Incomplete    Radiographs and labs were personally reviewed by me.   Assessment and Plan 77 y.o. 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  Group G streptococcus bacteremia. This is an Alpha strep ( not strep bovis group like I had mentioned in my previous note)   .  This is usually a skin/ GI organism and the source could be from vulval ulcer. Port is possibility but not sure currently that is the source. Before we remove the port   will need TEE to rule out endocarditis  I have asked lab to Identify the species so that we can have a better idea of the source. Continue ceftriaxone  Recurrent vulvar carcinoma with a deep ulcer- culture the ulcer to see whether same organism is seen  Anemia of chronic disease Discussed the management with the patient  Tsosie Billing, MD  09/15/2018, 7:57 PM  Note:  This document was prepared using Dragon voice recognition software and may include unintentional dictation errors.

## 2018-09-15 NOTE — Telephone Encounter (Signed)
Sent Dr Tasia Catchings a rx refill request with the dx code on it for her to sign off on

## 2018-09-15 NOTE — Progress Notes (Signed)
Initial Nutrition Assessment  DOCUMENTATION CODES:   Not applicable  INTERVENTION:  Magic cup TID with meals, each supplement provides 290 kcal and 9 grams of protein  MVI daily    NUTRITION DIAGNOSIS:   Increased nutrient needs related to cancer and cancer related treatments as evidenced by estimated needs.   GOAL:   Patient will meet greater than or equal to 90% of their needs  MONITOR:   PO intake, Labs, Weight trends  REASON FOR ASSESSMENT:   Malnutrition Screening Tool    ASSESSMENT:   77 y.o. female w/ recurrent vulvar cancer, HTN, A-fib admitted for fever   Visited pt in room today. Pt seemed agitated upon entrance to the room. She says she hasn't been eating the food because she doesn't like it, says "it has no taste". Pt repeatedly asked the purpose of the visit. Pt irritated and not appropriate for physical exam today. Recommend giving Magic Cup TID to encourage higher calorie and protein intake during her stay.   Medications reviewed and include: Fentanyl patch, senna, vitamin B-12 1000 mcg, warfarin Labs reviewed: Co2 20 (L), Calcium 7.3 (L), RBC 3.49 (L), Hbg 9.6 (L), Hct 28.6 (L), RDW 16.0 (H), Platelets 149 (L), Protime 25.6 (H)  NUTRITION - FOCUSED PHYSICAL EXAM:    Most Recent Value  Orbital Region  Unable to assess  Upper Arm Region  Unable to assess  Thoracic and Lumbar Region  Unable to assess  Buccal Region  Unable to assess  [place] Region  Unable to assess  Clavicle Bone Region  Unable to assess  Clavicle and Acromion Bone Region  Unable to assess  Scapular Bone Region  Unable to assess  Dorsal Hand  Unable to assess  Patellar Region  Unable to assess  Anterior Thigh Region  Unable to assess  Posterior Calf Region  Unable to assess  Edema (RD Assessment)  Unable to assess  Hair  Unable to assess  Eyes  Unable to assess  Mouth  Unable to assess  Skin  Unable to assess  Nails  Unable to assess       Diet Order:   Diet Order       Diet regular Room service appropriate? Yes; Fluid consistency: Thin  Diet effective now              EDUCATION NEEDS:   No education needs have been identified at this time  Skin:  Skin Assessment: Reviewed RN Assessment  Last BM:   9/14  Height:   Ht Readings from Last 1 Encounters:  09/13/18 5\' 3"  (1.6 m)    Weight:   Wt Readings from Last 1 Encounters:  09/15/18 54.4 kg    Ideal Body Weight:  52.3 kg  BMI:  Body mass index is 21.24 kg/m.  Estimated Nutritional Needs:   Kcal:  1500-1800  Protein:  85-95  Fluid:  >1.5 L    Lennex Pietila, Dietetic Intern [phone]

## 2018-09-15 NOTE — Telephone Encounter (Signed)
Patient insurance requiring ICD 10 code for her Fentanyl. Pharmacy requests prescription be rewritten with the code in the notes and be resubmitted

## 2018-09-15 NOTE — Evaluation (Signed)
Occupational Therapy Evaluation Patient Details Name: Virginia Murray MRN: [id number] DOB: [birth year] Today's Date: 09/15/2018    History of Present Illness Patient is a 77 y/o female recently diagnosed with recurrent vulvular cancer, admitted with fever and chills with elevated WBC, being worked up for UTI.    Clinical Impression   Pt seen for OT evaluation this date. Pt initially pleasant, agreeable to OT's presence but quickly became irritable as OT asked questions to determine PLOF and home set up (please see below for available PLOF/home set up). OT provided emotional support, educated in role of OT and how OT could support pt going through significant transitions (spouse passing, moved from [place] to live with son and his family, managing fatigue/pain as she initiates chemotherapy in the near future, etc.) while minimizing her falls risk and maximizing her independence and quality of life. Pt stated, "my husband recently passed away, you know, and I'm still grieving. I don't know why you're asking me all these questions about the home and what I have to do during the day. I just don't feel like talking." OT gently requested to leave a handout on energy conservation strategies for the pt to review at later time. Pt declined. Pt noted to be generally at baseline independence with mobility during brief PT evaluation on previous date. Pt declines need for assist with ADL at this time. Pt could likely benefit from skilled OT services (energy conservation, falls prevention, establishing routines in new environment, etc.), however pt currently declining at this time. No additional follow up at this time 2/2 pt's lack of readiness/willingness to participate. Upon hospital discharge, recommend pt discharge to son's home with family assist as needed. Will sign off. Please re-consult if additional needs arise.   *Pt may benefit from a psychiatry consult to address pt's emotional/mental health which has been  impacted by significant recent life events (recent passing of her spouse, move from [place] to [place] a couple weeks ago, recurrence of cancer and starting chemotherapy soon).     Follow Up Recommendations  No OT follow up    Equipment Recommendations  None recommended by OT    Recommendations for Other Services Other (comment)     Precautions / Restrictions Precautions Precautions: None Restrictions Weight Bearing Restrictions: No      Mobility Bed Mobility                  Transfers                      Balance                                           ADL either performed or assessed with clinical judgement   ADL                                         General ADL Comments: Difficult to more formally assess, as pt declines need for OT and states "I don't want to talk anymore."      Vision         Perception     Praxis      Pertinent Vitals/Pain Pain Assessment: No/denies pain(pt states "it's fine, I just had some pain medicine" but declines to provide further detail regarding her pain)  Hand Dominance     Extremity/Trunk Assessment Upper Extremity Assessment Upper Extremity Assessment: Overall WFL for tasks assessed   Lower Extremity Assessment Lower Extremity Assessment: Overall WFL for tasks assessed   Cervical / Trunk Assessment Cervical / Trunk Assessment: Normal   Communication Communication Communication: No difficulties   Cognition Arousal/Alertness: Awake/alert Behavior During Therapy: Agitated Overall Cognitive Status: Within Functional Limits for tasks assessed                                 General Comments: Pt irritable, becoming increasingly agitated with questioning, emotional support provided   General Comments       Exercises Other Exercises Other Exercises: Pt educated in role of OT and how OT could be helpful as pt is transitioning from [place] to live with son/family  in [place] and now with recurrent cancer. Briefly introduced role of energy conservation strategies to support pt's participation in basic ADL and meaningful occupations. Pt declined education and handout, stating "I just don't want to talk anymore. When I'm ready to talk, I will let people know."   Shoulder Instructions      Home Living Family/patient expects to be discharged to:: Private residence Living Arrangements: Children(son, daughter in law, granddaughter) Available Help at Discharge: Family Type of Home: House Home Access: Stairs to enter [employer] of Steps: pt refused to state                       Additional Comments: pt became irritable with additional questions regarding home set up and declined to answer       Prior Functioning/Environment Level of Independence: Independent        Comments: Pt reports being independent but declines to go into detail        OT Problem List:        OT Treatment/Interventions:      OT Goals(Current goals can be found in the care plan section) Acute Rehab OT Goals Patient Stated Goal: pt declined to verbalize a goal OT Goal Formulation: All assessment and education complete, DC therapy  OT Frequency:     Barriers to D/C:            Co-evaluation              AM-PAC PT "6 Clicks" Daily Activity     Outcome Measure Help from another person eating meals?: None Help from another person taking care of personal grooming?: None Help from another person toileting, which includes using toliet, bedpan, or urinal?: None Help from another person bathing (including washing, rinsing, drying)?: None Help from another person to put on and taking off regular upper body clothing?: None Help from another person to put on and taking off regular lower body clothing?: None 6 Click Score: 24   End of Session    Activity Tolerance: Treatment limited secondary to agitation Patient left: in bed;with call bell/phone  within reach  OT Visit Diagnosis: Other abnormalities of gait and mobility (R26.89)                Time: 5366-4403 OT Time Calculation (min): 10 min Charges:  OT General Charges $OT Visit: 1 Visit OT Evaluation $OT Eval Low Complexity: 1 Low  Jeni Salles, MPH, MS, OTR/L ascom [phone] 09/15/18, 10:30 AM

## 2018-09-15 NOTE — Progress Notes (Addendum)
Hematology/Oncology Progress Note [facility] Telephone:([phone] [phone]  Patient Care Team: Patient, No Pcp Per as PCP - General (General Practice) Clent Jacks, RN as Registered Nurse   Name of the patient: Virginia Murray  [id number]  06/20/1941  Date of visit: 09/15/18   INTERVAL HISTORY-  Patient was seen and examined at bedside.  She reports pain is better controlled after trying fentanyl patch.  Over the weekend, there was issue with insurance approval so she did not get the fentanyl patch. No fever or chills today.    Review of systems- Review of Systems  Constitutional: Positive for malaise/fatigue.  HENT: Negative for hearing loss.   Respiratory: Negative for cough.   Cardiovascular: Negative for chest pain.  Gastrointestinal: Negative for heartburn.  Genitourinary: Negative for hematuria.  Musculoskeletal: Negative for myalgias.  Skin: Negative for rash.  Neurological: Negative for focal weakness.  Endo/Heme/Allergies: Bruises/bleeds easily.  Psychiatric/Behavioral: The patient is nervous/anxious.     No Known Allergies  Patient Active Problem List   Diagnosis Date Noted  . Fever 09/13/2018  . Vulvar cancer ([place]) 09/03/2018  . Goals of care, counseling/discussion 09/03/2018     Past Medical History:  Diagnosis Date  . A-fib ([place])   . Cancer ([facility])    vulva   . Depression   . Diverticulitis   . Hyperlipemia   . Hypertension   . Vulva cancer [facility])      Past Surgical History:  Procedure Laterality Date  . ABDOMINAL HYSTERECTOMY    . CATARACT EXTRACTION, BILATERAL    . KNEE SURGERY     right knee   . PORTA CATH INSERTION N/A 09/04/2018   Procedure: PORTA CATH INSERTION;  Surgeon: Algernon Huxley, MD;  Location: [place] [facility];  Service: Cardiovascular;  Laterality: N/A;  . TONSILLECTOMY    . VULVA SURGERY      Social History   Socioeconomic History  . Marital status: Widowed    Spouse name:  Not on file  . Number of children: 2  . Years of education: Not on file  . Highest education level: Not on file  Occupational History  . Not on file  Social Needs  . Financial resource strain: Not on file  . Food insecurity:    Worry: Not on file    Inability: Not on file  . Transportation needs:    Medical: Not on file    Non-medical: Not on file  Tobacco Use  . Smoking status: Former Smoker    Packs/day: 1.00    Years: 20.00    Pack years: 20.00    Types: Cigarettes    Last attempt to quit: 08/29/1993    Years since quitting: 25.0  . Smokeless tobacco: Never Used  Substance and Sexual Activity  . Alcohol use: Not Currently  . Drug use: Never  . Sexual activity: Not Currently  Lifestyle  . Physical activity:    Days per week: Not on file    Minutes per session: Not on file  . Stress: Not on file  Relationships  . Social connections:    Talks on phone: Not on file    Gets together: Not on file    Attends religious service: Not on file    Active member of club or organization: Not on file    Attends meetings of clubs or organizations: Not on file    Relationship status: Not on file  . Intimate partner violence:    Fear of current  or ex partner: Not on file    Emotionally abused: Not on file    Physically abused: Not on file    Forced sexual activity: Not on file  Other Topics Concern  . Not on file  Social History Narrative  . Not on file     Family History  Problem Relation Age of Onset  . Alzheimer's disease Mother   . Heart disease Father      Current Facility-Administered Medications:  .  0.9 %  sodium chloride infusion, , Intravenous, Continuous, Mayo, Pete Pelt, MD, Last Rate: 75 mL/hr at 09/14/18 0348 .  acetaminophen (TYLENOL) tablet 650 mg, 650 mg, Oral, Q6H PRN, 650 mg at 09/13/18 2010 **OR** acetaminophen (TYLENOL) suppository 650 mg, 650 mg, Rectal, Q6H PRN, Mayo, Pete Pelt, MD .  ALPRAZolam Duanne Moron) tablet 0.25 mg, 0.25 mg, Oral, BID PRN, Benjie Karvonen,  Sital, MD, 0.25 mg at 09/15/18 1146 .  atenolol (TENORMIN) tablet 100 mg, 100 mg, Oral, Daily, Mayo, Pete Pelt, MD, 100 mg at 09/15/18 0815 .  ceFEPIme (MAXIPIME) 2 g in sodium chloride 0.9 % 100 mL IVPB, 2 g, Intravenous, Q12H, Mayo, Pete Pelt, MD, Last Rate: 200 mL/hr at 09/15/18 0816, 2 g at 09/15/18 0816 .  docusate sodium (COLACE) capsule 100 mg, 100 mg, Oral, BID, Mody, Sital, MD .  fentaNYL ([place] - dosed mcg/hr) patch 25 mcg, 25 mcg, Transdermal, Q72H, Mayo, Pete Pelt, MD, 25 mcg at 09/13/18 1411 .  ondansetron (ZOFRAN) tablet 4 mg, 4 mg, Oral, Q6H PRN, 4 mg at 09/13/18 1512 **OR** ondansetron (ZOFRAN) injection 4 mg, 4 mg, Intravenous, Q6H PRN, Mayo, Pete Pelt, MD .  oxyCODONE (Oxy IR/ROXICODONE) immediate release tablet 5 mg, 5 mg, Oral, Q4H PRN, Mayo, Pete Pelt, MD, 5 mg at 09/15/18 0936 .  polyethylene glycol (MIRALAX / GLYCOLAX) packet 17 g, 17 g, Oral, Daily PRN, Mayo, Pete Pelt, MD .  pravastatin (PRAVACHOL) tablet 40 mg, 40 mg, Oral, q1800, Mayo, Pete Pelt, MD, 40 mg at 09/15/18 0815 .  prochlorperazine (COMPAZINE) tablet 10 mg, 10 mg, Oral, Q6H PRN, Mayo, Pete Pelt, MD .  [facility]) tablet 17.2 mg, 2 tablet, Oral, Daily, Mayo, Pete Pelt, MD, 17.2 mg at 09/15/18 0815 .  traMADol (ULTRAM) tablet 50 mg, 50 mg, Oral, Q6H PRN, Benjie Karvonen, Sital, MD, 50 mg at 09/15/18 1146 .  vitamin B-12 (CYANOCOBALAMIN) tablet 1,000 mcg, 1,000 mcg, Oral, Daily, Mody, Sital, MD, 1,000 mcg at 09/15/18 0815 .  warfarin (COUMADIN) tablet 2 mg, 2 mg, Oral, ONCE-1800, Hallaji, Sheema M, RPH .  Warfarin - Pharmacist Dosing Inpatient, , Does not apply, q1800, Mayo, Pete Pelt, MD, Stopped at 09/14/18 1800   Physical exam:  Vitals:   09/14/18 1449 09/14/18 1918 09/15/18 0351 09/15/18 1119  BP: (!) 140/43 (!) 136/56 (!) 150/53   Pulse: 61 71 63   Resp: 18 17 17    Temp: 98.2 F (36.8 C) 99.7 F (37.6 C) 98.5 F (36.9 C)   TempSrc: Oral Oral Oral   SpO2: 97% 94% 94%   Weight:    119 lb 14.9 oz (54.4  kg)  Height:       Physical Exam  Constitutional: She is oriented to person, place, and time. No distress.  HENT:  Head: Normocephalic and atraumatic.  Eyes: Pupils are equal, round, and reactive to light. EOM are normal.  Neck: Normal range of motion. Neck supple.  Cardiovascular: Normal rate and regular rhythm.  No murmur heard. Pulmonary/Chest: Effort normal. No respiratory distress.  Abdominal: Soft.  Bowel sounds are normal.  Musculoskeletal: Normal range of motion. She exhibits no edema or deformity.  Neurological: She is alert and oriented to person, place, and time.  Skin: Skin is warm and dry. She is not diaphoretic.  Psychiatric:  Anxious       CMP Latest Ref Rng & Units 09/15/2018  Glucose 70 - 99 mg/dL 83  BUN 8 - 23 mg/dL 16  Creatinine 0.44 - 1.00 mg/dL 0.70  Sodium 135 - 145 mmol/L 136  Potassium 3.5 - 5.1 mmol/L 3.6  Chloride 98 - 111 mmol/L 111  CO2 22 - 32 mmol/L 20(L)  Calcium 8.9 - 10.3 mg/dL 7.3(L)  Total Protein 6.5 - 8.1 g/dL -  Total Bilirubin 0.3 - 1.2 mg/dL -  Alkaline Phos 38 - 126 U/L -  AST 15 - 41 U/L -  ALT 0 - 44 U/L -   CBC Latest Ref Rng & Units 09/15/2018  WBC 3.6 - 11.0 K/uL 8.6  Hemoglobin 12.0 - 16.0 g/dL 9.6(L)  Hematocrit 35.0 - 47.0 % 28.6(L)  Platelets 150 - 440 K/uL 149(L)   RADIOGRAPHIC STUDIES: I have personally reviewed the radiological images as listed and agreed with the findings in the report. Dg Chest 1 View  Result Date: 09/13/2018 CLINICAL DATA:  fever and weakness onset 2-3 days ago. Denies CP, SOB or cough at this time. Patient reports recent dx of vulvar ca that was due to begin chemo treatment for yesterday, but was unable to begin because she was too hypotensive. Hx HTN, port-a-cath insertion. Former smoker. EXAM: CHEST  1 VIEW COMPARISON:  none FINDINGS: Lungs are clear. Right IJ power injectable port catheter to the distal SVC. No pneumothorax. Heart size upper limits normal. Retrocardiac double density presumably  hiatal hernia. Aortic Atherosclerosis (ICD10-170.0). No effusion. Visualized bones unremarkable. IMPRESSION: No acute cardiopulmonary disease. Electronically Signed   By: Lucrezia Europe M.D.   On: 09/13/2018 09:37    Assessment and plan-  Patient is a 77 y.o. female with known history of recurrent vulvar cancer currently admitted due to fever chills, positive for bacteremia.  #Bacteremia, blood culture positive for Streptococcus species.  Agree with IV antibiotics.  Infectious disease was consulted.  Appreciate input. #Recurrent vulvar cancer, with involvement of the vaginal and rectum. Palliative chemotherapy was delayed on 09/12/2018 due to hypotension.  She is supposed to start chemotherapy today but ended up admitted due to bacteremia over the weekend.  She has been evaluated by GYN oncology and due to her poor performance status exenteration will be palliative only.  We have discussed that her disease not curable at this point and chemotherapy may palliative her symptoms and hopefully prolong her life.  We also have sent molecular testing [foundation one] testing, awaiting results.  # Goal of care: She is very symptomatic from vulvar mass, and I have discussed with her about involving palliative care service.  Patient becomes very upset.  She says" so you are telling me I am dying, why bother telling me, if you do not bother treating !"  Explained to patient that involving palliative care service earlier in the course has been shown to have better outcome.  However if she is not willing to discuss this, I respect her wish.  Palliative chemotherapy can still be considered if she recovers from current infection and remains in acceptable performance status.  Patient continues to be agitated during the rest of our discussion. She wants me to call her daughter-in-law. I called patient's daughter-in-law Alroy Dust.  Patient  currently lives with son and daughter-in-law.  Daughter-in-law tells me that she is not  surprised that patient reacts that way.  Patient has been very difficult to take care of, and patient has not being respectful to her family members.    #Anxiety/agitation/questionable personality changes: I will recommend obtaining CT brain to rule out any intracranial process.  She will benefit from psychiatry evaluation.  Recommend PT OT evaluation. Will consult case manager.   She can follow-up with me outpatient for reassessment for palliative chemotherapy.  Thank you for allowing me to participate in the care of this patient.  Total face to face encounter time for this patient visit was 50 min. >50% of the time was  spent in counseling and coordination of care.   Earlie Server, MD, PhD Hematology Oncology [facility] at [facility] Pager- [phone] 09/15/2018

## 2018-09-15 NOTE — Progress Notes (Signed)
ID Group G streptococcus bacteremia Ulcerating vulval carcinoma recurrence PORT placed 10 days ago  Will ask lab to ID the bacteria- r/o strep bovis group Will need TEE Not sure whether the port is the source- if TEE positive PORT will have to be removed

## 2018-09-16 ENCOUNTER — Inpatient Hospital Stay: Payer: Self-pay

## 2018-09-16 ENCOUNTER — Encounter
Admission: EM | Disposition: A | Discharge status: Home Health | Payer: Self-pay | Source: Home / Self Care | Attending: Internal Medicine

## 2018-09-16 ENCOUNTER — Inpatient Hospital Stay (HOSPITAL_COMMUNITY)
Admit: 2018-09-16 | Discharge: 2018-09-16 | Disposition: A | Discharge status: Home / Self Care | Payer: Medicare Other | Attending: Internal Medicine | Admitting: Internal Medicine

## 2018-09-16 DIAGNOSIS — R7881 Bacteremia: Secondary | ICD-10-CM

## 2018-09-16 DIAGNOSIS — I480 Paroxysmal atrial fibrillation: Secondary | ICD-10-CM

## 2018-09-16 HISTORY — PX: TEE WITHOUT CARDIOVERSION: SHX5443

## 2018-09-16 LAB — URINALYSIS, COMPLETE (UACMP) WITH MICROSCOPIC
BILIRUBIN URINE: NEGATIVE
GLUCOSE, UA: NEGATIVE mg/dL
KETONES UR: NEGATIVE mg/dL
Nitrite: NEGATIVE
PH: 5 (ref 5.0–8.0)
PROTEIN: NEGATIVE mg/dL
Specific Gravity, Urine: 1.015 (ref 1.005–1.030)

## 2018-09-16 LAB — CULTURE, BLOOD (ROUTINE X 2): SPECIAL REQUESTS: ADEQUATE

## 2018-09-16 LAB — PROTIME-INR
INR: 2.67
Prothrombin Time: 28.2 seconds — ABNORMAL HIGH (ref 11.4–15.2)

## 2018-09-16 SURGERY — ECHOCARDIOGRAM, TRANSESOPHAGEAL
Anesthesia: Moderate Sedation

## 2018-09-16 MED ORDER — AMLODIPINE BESYLATE 5 MG PO TABS
2.5000 mg | ORAL_TABLET | Freq: Every day | ORAL | Status: DC
  Administered 2018-09-16 – 2018-09-17 (×2): 2.5 mg via ORAL
  Filled 2018-09-16 (×2): qty 1

## 2018-09-16 MED ORDER — WARFARIN SODIUM 2 MG PO TABS
2.0000 mg | ORAL_TABLET | Freq: Once | ORAL | Status: AC
  Administered 2018-09-16: 19:00:00 2 mg via ORAL
  Filled 2018-09-16: qty 1

## 2018-09-16 MED ORDER — LIDOCAINE VISCOUS HCL 2 % MT SOLN
OROMUCOSAL | Status: AC
  Filled 2018-09-16: qty 15

## 2018-09-16 MED ORDER — LIDOCAINE VISCOUS HCL 2 % MT SOLN
OROMUCOSAL | Status: AC | PRN
  Administered 2018-09-16: 15 mL via OROMUCOSAL

## 2018-09-16 MED ORDER — BUTAMBEN-TETRACAINE-BENZOCAINE 2-2-14 % EX AERO
INHALATION_SPRAY | CUTANEOUS | Status: AC | PRN
  Administered 2018-09-16: 1 via TOPICAL

## 2018-09-16 MED ORDER — SODIUM CHLORIDE FLUSH 0.9 % IV SOLN
INTRAVENOUS | Status: AC
  Filled 2018-09-16: qty 10

## 2018-09-16 MED ORDER — MIDAZOLAM HCL 5 MG/5ML IJ SOLN
INTRAMUSCULAR | Status: AC | PRN
  Administered 2018-09-16 (×2): 1 mg via INTRAVENOUS

## 2018-09-16 MED ORDER — FENTANYL CITRATE (PF) 100 MCG/2ML IJ SOLN
INTRAMUSCULAR | Status: AC | PRN
  Administered 2018-09-16 (×2): 25 ug via INTRAVENOUS

## 2018-09-16 MED ORDER — MIDAZOLAM HCL 5 MG/5ML IJ SOLN
INTRAMUSCULAR | Status: AC
  Filled 2018-09-16: qty 5

## 2018-09-16 MED ORDER — FENTANYL 25 MCG/HR TD PT72: 25.0000 ug | MEDICATED_PATCH | TRANSDERMAL | 0 refills | Status: DC

## 2018-09-16 MED ORDER — FENTANYL CITRATE (PF) 100 MCG/2ML IJ SOLN
INTRAMUSCULAR | Status: AC
  Filled 2018-09-16: qty 2

## 2018-09-16 MED ORDER — SODIUM CHLORIDE 0.9 % IV SOLN: INTRAVENOUS | Status: DC

## 2018-09-16 MED ORDER — BUTAMBEN-TETRACAINE-BENZOCAINE 2-2-14 % EX AERO
INHALATION_SPRAY | CUTANEOUS | Status: AC
  Filled 2018-09-16: qty 5

## 2018-09-16 NOTE — Plan of Care (Signed)

## 2018-09-16 NOTE — Progress Notes (Signed)
Test requisition sent to  [place] at the request of Dr. Tasia Catchings Oncology Nurse Navigator Documentation  Navigator Location: [facility] (09/16/18 1200)   )Navigator Encounter Type: Other;Letter/Fax/Email (09/16/18 1200)                                                    Time Spent with Patient: 15 (09/16/18 1200)

## 2018-09-16 NOTE — Consult Note (Signed)
Cardiology Consultation:   Patient ID: [place] MRN: [id number]; DOB: 05-31-1941  Admit date: 09/13/2018 Date of Consult: 09/16/2018  Primary Care Provider: Patient, No Pcp Per Primary Cardiologist: [place] - scheduled to see Arida next month Primary Electrophysiologist:  None   Patient Profile:   [place] is a 77 y.o. female with a hx of recurrent vulvar cancer, paroxysmal atrial fibrillation on warfarin, hypertension, and hyperlipidemia who is being seen today for the evaluation of bacteremia and possible esophageal echocardiogram at the request of [place] [facility].  History of Present Illness:   [place] presented to the [facility] emergency department 3 days ago due to fevers and chills that began earlier in the day.  She also endorsed increased urinary frequency as well as suprapubic pain and malodorous urine.  She was found to be febrile with a temperature of 102.6 F in the emergency department and mild leukocytosis.  Subsequent blood cultures revealed group G Streptococcus.  After consultation with infectious disease, transesophageal echocardiogram has been recommended to exclude endocarditis.  Patient is currently feeling well, denying further chills.  She has not had any chest pain, shortness of breath, palpitations, lightheadedness, and edema.  She has a history of paroxysmal atrial fibrillation that was diagnosed and managed in [place].  She has not established with a cardiologist since moving to [place] but is scheduled to see [place] in our office next month.  She has been maintained on warfarin, with most recent INR monitoring performed through [facility] office.  [place] was scheduled to begin chemotherapy for her recurrent vulvar cancer a few days ago, though this has been placed on hold due to her active infection.  [place] reports being diagnosed with a hiatal hernia many years ago.  She does not have any dysphasia nor  has she had instrumentation of her esophagus or stomach.  Past Medical History:  Diagnosis Date  . A-fib ([place] [facility])   . Cancer (HCC)    vulva   . Depression   . Diverticulitis   . Hyperlipemia   . Hypertension   . Vulva cancer [facility])     Past Surgical History:  Procedure Laterality Date  . ABDOMINAL HYSTERECTOMY    . CATARACT EXTRACTION, BILATERAL    . KNEE SURGERY     right knee   . PORTA CATH INSERTION N/A 09/04/2018   Procedure: PORTA CATH INSERTION;  Surgeon: [place] Huxley, MD;  Location: [facility];  Service: Cardiovascular;  Laterality: N/A;  . TONSILLECTOMY    . VULVA SURGERY       Home Medications:  Prior to Admission medications   Medication Sig Start Date Denese Mentink Date Taking? Authorizing Provider  amLODipine (NORVASC) 2.5 MG tablet Take 2.5 mg by mouth daily.   Yes [provider]  atenolol (TENORMIN) 100 MG tablet Take 100 mg by mouth daily.   Yes [provider]  benazepril (LOTENSIN) 40 MG tablet Take 40 mg by mouth daily.   Yes [provider]  dexamethasone (DECADRON) 4 MG tablet Take 2 tablets (8 mg total) by mouth daily. Start the day after chemotherapy for 2 days. 09/03/18  Yes [place] Server, MD  doxazosin (CARDURA) 8 MG tablet Take 8 mg by mouth daily.   Yes [provider]  fentaNYL (DURAGESIC - DOSED MCG/HR) 25 MCG/HR patch Place 1 patch (25 mcg total) onto the skin every 3 (three) days. 09/12/18  Yes [place], MD  lidocaine-prilocaine (EMLA) cream Apply to affected area  once 09/03/18  Yes [place], MD  ondansetron (ZOFRAN) 8 MG tablet Take 1 tablet (8 mg total) by mouth 2 (two) times daily as needed for refractory nausea / vomiting. Start on day 3 after chemo. 09/03/18  Yes [place], MD  polyethylene glycol [facility]) packet Take 17 g by mouth daily as needed for mild constipation or moderate constipation. 08/29/18  Yes [place], MD  pravastatin (PRAVACHOL) 40 MG tablet Take 40 mg by mouth daily.   Yes [provider]    prochlorperazine (COMPAZINE) 10 MG tablet Take 1 tablet (10 mg total) by mouth every 6 (six) hours as needed (Nausea or vomiting). 09/03/18  Yes [place], MD  senna (SENOKOT) 8.6 MG TABS tablet Take 2 tablets (17.2 mg total) by mouth daily. 08/29/18  Yes [place], MD  warfarin (COUMADIN) 1 MG tablet Take 1 tablet (1 mg total) by mouth daily. Patient taking differently: Take 3 mg by mouth daily.  08/29/18  Yes [place], MD  oxyCODONE (ROXICODONE) 5 MG immediate release tablet Take 1 tablet (5 mg total) by mouth every 4 (four) hours as needed for moderate pain or severe pain. Patient not taking: Reported on 09/13/2018 09/12/18   [place], MD    Inpatient Medications: Scheduled Meds: . atenolol  100 mg Oral Daily  . docusate sodium  100 mg Oral BID  . fentaNYL  25 mcg Transdermal Q72H  . pravastatin  40 mg Oral q1800  . senna  2 tablet Oral Daily  . vitamin B-12  1,000 mcg Oral Daily  . warfarin  2 mg Oral ONCE-1800  . Warfarin - Pharmacist Dosing Inpatient   Does not apply q1800   Continuous Infusions: . sodium chloride 75 mL/hr at 09/16/18 0929  . sodium chloride    . cefTRIAXone (ROCEPHIN)  IV 2 g (09/15/18 2157)   PRN Meds: acetaminophen **OR** acetaminophen, ALPRAZolam, diphenhydrAMINE, ondansetron **OR** ondansetron (ZOFRAN) IV, oxyCODONE, polyethylene glycol, prochlorperazine, traMADol  Allergies:   No Known Allergies  Social History:   Social History   Socioeconomic History  . Marital status: Widowed    Spouse name: Not on file  . Number of children: 2  . Years of education: Not on file  . Highest education level: Not on file  Occupational History  . Not on file  Social Needs  . Financial resource strain: Not on file  . Food insecurity:    Worry: Not on file    Inability: Not on file  . Transportation needs:    Medical: Not on file    Non-medical: Not on file  Tobacco Use  . Smoking status: Former Smoker    Packs/day: 1.00    Years: 20.00    Pack years: 20.00     Types: Cigarettes    Last attempt to quit: 08/29/1993    Years since quitting: 25.0  . Smokeless tobacco: Never Used  Substance and Sexual Activity  . Alcohol use: Not Currently  . Drug use: Never  . Sexual activity: Not Currently  Lifestyle  . Physical activity:    Days per week: Not on file    Minutes per session: Not on file  . Stress: Not on file  Relationships  . Social connections:    Talks on phone: Not on file    Gets together: Not on file    Attends religious service: Not on file    Active member of club or organization: Not on file    Attends meetings of clubs or organizations:  Not on file    Relationship status: Not on file  . Intimate partner violence:    Fear of current or ex partner: Not on file    Emotionally abused: Not on file    Physically abused: Not on file    Forced sexual activity: Not on file  Other Topics Concern  . Not on file  Social History Narrative  . Not on file    Family History:    Family History  Problem Relation Age of Onset  . Alzheimer's disease Mother   . Heart disease Father      ROS:  Please see the history of present illness. All other ROS reviewed and negative.     Physical Exam/Data:   Vitals:   09/15/18 1415 09/15/18 2026 09/16/18 0342 09/16/18 0811  BP: (!) 134/54 (!) 164/69 (!) 148/49 (!) 177/58  Pulse: (!) 59 68 61 (!) 59  Resp: 18 17 17 18   Temp: 98.3 F (36.8 C) 98.6 F (37 C) 97.6 F (36.4 C) 98.8 F (37.1 C)  TempSrc:  Oral Oral Oral  SpO2: 94% 93% 93% 95%  Weight:      Height:        Intake/Output Summary (Last 24 hours) at 09/16/2018 1035 Last data filed at 09/16/2018 0400 Gross per 24 hour  Intake 340 ml  Output -  Net 340 ml   Filed Weights   09/13/18 0852 09/13/18 1348 09/15/18 1119  Weight: 49.7 kg 55.3 kg 54.4 kg   Body mass index is 21.24 kg/m.  General: Thin woman, lying comfortably in bed. HEENT: normal Lymph: no adenopathy Neck: no JVD Endocrine:  No thryomegaly Vascular: No carotid  bruits; 2+ radial and pedal pulses bilaterally. Cardiac:  normal S1, S2; RRR; no murmur or rub, or gallop Lungs:  clear to auscultation bilaterally, no wheezing, rhonchi or rales  Abd: soft with mild lower abdominal tenderness.  No HSM. Ext: no edema Musculoskeletal:  No deformities, BUE and BLE strength normal and equal Skin: warm and dry  Neuro:  CNs 2-12 intact, no focal abnormalities noted Psych:  Normal affect   EKG:  The EKG performed 09/13/2018 was personally reviewed and demonstrates: Normal sinus rhythm with right axis deviation.  Otherwise, no significant abnormality. Telemetry: Patient not currently on telemetry.  Relevant CV Studies: Transthoracic echo (09/14/2018): - Left ventricle: The cavity size was normal. There was mild   concentric hypertrophy. Systolic function was normal. The   estimated ejection fraction was in the range of 55% to 60%. Left   ventricular diastolic function parameters were normal. - Aortic valve: There was trivial regurgitation. - Left atrium: The atrium was moderately dilated. - Pulmonary arteries: Systolic pressure was mildly increased. PA   peak pressure: 40 mm Hg (S). - There was no evidence of a vegetation.  Laboratory Data:  Chemistry Recent Labs  Lab 09/13/18 0858 09/14/18 0528 09/15/18 0447  NA 136 139 136  K 4.2 3.8 3.6  CL 105 111 111  CO2 24 24 20*  GLUCOSE 121* 95 83  BUN 31* 22 16  CREATININE 0.94 0.86 0.70  CALCIUM 8.6* 7.3* 7.3*  GFRNONAA 58* >60 >60  GFRAA >60 >60 >60  ANIONGAP 7 4* 5    Recent Labs  Lab 09/12/18 0830 09/13/18 0858  PROT 6.8 6.2*  ALBUMIN 2.9* 2.7*  AST 20 15  ALT 10 10  ALKPHOS 72 67  BILITOT 0.8 0.7   Hematology Recent Labs  Lab 09/13/18 0858 09/14/18 0528 09/15/18 0447  WBC  10.7 8.0 8.6  RBC 3.96 3.55* 3.49*  HGB 10.8* 9.7* 9.6*  HCT 32.1* 28.9* 28.6*  MCV 81.2 81.5 81.9  MCH 27.4 27.2 27.6  MCHC 33.8 33.4 33.7  RDW 15.7* 16.1* 16.0*  PLT 193 154 149*   Cardiac EnzymesNo  results for input(s): TROPONINI in the last 168 hours. No results for input(s): TROPIPOC in the last 168 hours.  BNPNo results for input(s): BNP, PROBNP in the last 168 hours.  DDimer No results for input(s): DDIMER in the last 168 hours.  Radiology/Studies:  Dg Chest 1 View  Result Date: 09/13/2018 CLINICAL DATA:  fever and weakness onset 2-3 days ago. Denies CP, SOB or cough at this time. Patient reports recent dx of vulvar ca that was due to begin chemo treatment for yesterday, but was unable to begin because she was too hypotensive. Hx HTN, port-a-cath insertion. Former smoker. EXAM: CHEST  1 VIEW COMPARISON:  none FINDINGS: Lungs are clear. Right IJ power injectable port catheter to the distal SVC. No pneumothorax. Heart size upper limits normal. Retrocardiac double density presumably hiatal hernia. Aortic Atherosclerosis (ICD10-170.0). No effusion. Visualized bones unremarkable. IMPRESSION: No acute cardiopulmonary disease. Electronically Signed   By: Lucrezia Europe M.D.   On: 09/13/2018 09:37   Ct Head Wo Contrast  Result Date: 09/15/2018 CLINICAL DATA:  77 y/o F; anxiety, agitation, possible personality change. Rule out intracranial process. History of vulvar cancer. EXAM: CT HEAD WITHOUT CONTRAST TECHNIQUE: Contiguous axial images were obtained from the base of the skull through the vertex without intravenous contrast. COMPARISON:  None. FINDINGS: Brain: No evidence of acute infarction, hemorrhage, hydrocephalus, extra-axial collection or mass lesion/mass effect. Nonspecific white matter hypodensities are compatible with mild chronic microvascular ischemic changes. Mild volume loss of the brain for age. Vascular: Calcific atherosclerosis of the carotid siphons. No hyperdense vessel identified. Skull: Normal. Negative for fracture or focal lesion. Sinuses/Orbits: No acute finding. Other: None. IMPRESSION: 1. No acute intracranial abnormality identified. 2. Mild for age chronic microvascular ischemic  changes and volume loss of the brain. Electronically Signed   By: Kristine Garbe M.D.   On: 09/15/2018 18:52    Assessment and Plan:   Bacteremia Most likely urinary source or secondary to ulcerated vulvar mass.  Transthoracic echocardiogram without vegetation.  Infectious disease consultation has recommended TEE.  Proceed with transesophageal echocardiogram today.  I have reviewed the procedure, including its risks and benefits, with the patient.  She has agreed to proceed as well as resend her DNR/DNI order during the procedure.  Antimicrobial therapy per internal medicine and ID.  Assessment atrial fibrillation EKG on admission showed sinus rhythm.  Continue anticoagulation with warfarin.  Continue atenolol.  Follow-up with [place] [facility] as scheduled next month.  [facility] will sign off after completion of transesophageal echocardiogram later today.   Medication Recommendations: Continue warfarin with target INR of 2-3.  Antimicrobial therapy per internal medicine and ID. Other recommendations (labs, testing, etc): None. Follow up as an outpatient: Keep scheduled appointment with [place] [facility] on 10/21/2018.  For questions or updates, please contact [place] Please consult [email or web address] for contact info under [facility].   Signed, Nelva Bush, MD  09/16/2018 10:35 AM

## 2018-09-16 NOTE — Progress Notes (Signed)
ANTICOAGULATION CONSULT NOTE - Follow UP Consult  Pharmacy Consult for Warfarin dosing  Indication: atrial fibrillation  No Known Allergies  Patient Measurements: Height: 5\' 3"  (160 cm) Weight: 119 lb 14.9 oz (54.4 kg) IBW/kg (Calculated) : 52.4  Vital Signs: Temp: 97.6 F (36.4 C) (09/17 0342) Temp Source: Oral (09/17 0342) BP: 148/49 (09/17 0342) Pulse Rate: 61 (09/17 0342)  Labs: Recent Labs    09/13/18 0858 09/14/18 0528 09/15/18 0447 09/16/18 0413  HGB 10.8* 9.7* 9.6*  --   HCT 32.1* 28.9* 28.6*  --   PLT 193 154 149*  --   LABPROT 21.1* 28.7* 25.6* 28.2*  INR 1.84 2.73 2.36 2.67  CREATININE 0.94 0.86 0.70  --     Estimated Creatinine Clearance: 51 mL/min (by C-G formula based on SCr of 0.7 mg/dL).   Medical History: Past Medical History:  Diagnosis Date  . A-fib ([place])   . Cancer (HCC)    vulva   . Depression   . Diverticulitis   . Hyperlipemia   . Hypertension   . Vulva cancer (HCC)     Infusions:  . sodium chloride 75 mL/hr at 09/16/18 0400  . cefTRIAXone (ROCEPHIN)  IV 2 g (09/15/18 2157)    Assessment: 77 yo female on warfarin 3mg  daily for a-fib PTA.  Pharmacy consulted to order and monitor warfarin while admitted.  Significant increase in INR on 9/15, most likely due to start of ABx.  DATE INR Dose   9/14 1.84 3 mg  9/15 2.73 hold  9/16 2.36 2mg  9/17 2.67   Goal of Therapy:  INR 2-3 Monitor platelets by anticoagulation protocol: Yes   Plan:  INR therapeutic again today. Will order Warfarin 2mg  X 1 dose tonight.  INRs daily while on ABx per protocol.  Pharmacy will follow and adjust dose as per protocol.   Pernell Dupre, PharmD, BCPS Clinical Pharmacist 09/16/2018 7:03 AM

## 2018-09-16 NOTE — H&P (View-Only) (Signed)
Cardiology Consultation:   Patient ID: Sibley Rolison MRN: [id number]; DOB: 1941/11/01  Admit date: 09/13/2018 Date of Consult: 09/16/2018  Primary Care Provider: Patient, No Pcp Per Primary Cardiologist: New - scheduled to see Arida next month Primary Electrophysiologist:  None   Patient Profile:   Virginia Murray is a 77 y.o. female with a hx of recurrent vulvar cancer, paroxysmal atrial fibrillation on warfarin, hypertension, and hyperlipidemia who is being seen today for the evaluation of bacteremia and possible esophageal echocardiogram at the request of Dr. Genia Harold.  History of Present Illness:   Virginia Murray presented to the [facility] emergency department 3 days ago due to fevers and chills that began earlier in the day.  She also endorsed increased urinary frequency as well as suprapubic pain and malodorous urine.  She was found to be febrile with a temperature of 102.6 F in the emergency department and mild leukocytosis.  Subsequent blood cultures revealed group G Streptococcus.  After consultation with infectious disease, transesophageal echocardiogram has been recommended to exclude endocarditis.  Patient is currently feeling well, denying further chills.  She has not had any chest pain, shortness of breath, palpitations, lightheadedness, and edema.  She has a history of paroxysmal atrial fibrillation that was diagnosed and managed in [place].  She has not established with a cardiologist since moving to [place] but is scheduled to see Dr. Fletcher Anon in our office next month.  She has been maintained on warfarin, with most recent INR monitoring performed through Dr. Collie Siad office.  Virginia Murray was scheduled to begin chemotherapy for her recurrent vulvar cancer a few days ago, though this has been placed on hold due to her active infection.  Virginia Murray reports being diagnosed with a hiatal hernia many years ago.  She does not have any dysphasia nor  has she had instrumentation of her esophagus or stomach.  Past Medical History:  Diagnosis Date  . A-fib ([facility])   . Cancer (H[facility])    vulva   . Depression   . Diverticulitis   . Hyperlipemia   . Hypertension   . Vulva cancer [facility])     Past Surgical History:  Procedure Laterality Date  . ABDOMINAL HYSTERECTOMY    . CATARACT EXTRACTION, BILATERAL    . KNEE SURGERY     right knee   . PORTA CATH INSERTION N/A 09/04/2018   Procedure: PORTA CATH INSERTION;  Surgeon: Algernon Huxley, MD;  Location: [facility];  Service: Cardiovascular;  Laterality: N/A;  . TONSILLECTOMY    . VULVA SURGERY       Home Medications:  Prior to Admission medications   Medication Sig Start Date Jolette Lana Date Taking? Authorizing Provider  amLODipine (NORVASC) 2.5 MG tablet Take 2.5 mg by mouth daily.   Yes [provider]  atenolol (TENORMIN) 100 MG tablet Take 100 mg by mouth daily.   Yes [provider]  benazepril (LOTENSIN) 40 MG tablet Take 40 mg by mouth daily.   Yes [provider]  dexamethasone (DECADRON) 4 MG tablet Take 2 tablets (8 mg total) by mouth daily. Start the day after chemotherapy for 2 days. 09/03/18  Yes Earlie Server, MD  doxazosin (CARDURA) 8 MG tablet Take 8 mg by mouth daily.   Yes [provider]  fentaNYL (DURAGESIC - DOSED MCG/HR) 25 MCG/HR patch Place 1 patch (25 mcg total) onto the skin every 3 (three) days. 09/12/18  Yes Earlie Server, MD  lidocaine-prilocaine (EMLA) cream Apply to affected area  once 09/03/18  Yes Earlie Server, MD  ondansetron (ZOFRAN) 8 MG tablet Take 1 tablet (8 mg total) by mouth 2 (two) times daily as needed for refractory nausea / vomiting. Start on day 3 after chemo. 09/03/18  Yes Earlie Server, MD  polyethylene glycol [facility] packet Take 17 g by mouth daily as needed for mild constipation or moderate constipation. 08/29/18  Yes Earlie Server, MD  pravastatin (PRAVACHOL) 40 MG tablet Take 40 mg by mouth daily.   Yes [provider]    prochlorperazine (COMPAZINE) 10 MG tablet Take 1 tablet (10 mg total) by mouth every 6 (six) hours as needed (Nausea or vomiting). 09/03/18  Yes Earlie Server, MD  senna (SENOKOT) 8.6 MG TABS tablet Take 2 tablets (17.2 mg total) by mouth daily. 08/29/18  Yes Earlie Server, MD  warfarin (COUMADIN) 1 MG tablet Take 1 tablet (1 mg total) by mouth daily. Patient taking differently: Take 3 mg by mouth daily.  08/29/18  Yes Earlie Server, MD  oxyCODONE (ROXICODONE) 5 MG immediate release tablet Take 1 tablet (5 mg total) by mouth every 4 (four) hours as needed for moderate pain or severe pain. Patient not taking: Reported on 09/13/2018 09/12/18   Earlie Server, MD    Inpatient Medications: Scheduled Meds: . atenolol  100 mg Oral Daily  . docusate sodium  100 mg Oral BID  . fentaNYL  25 mcg Transdermal Q72H  . pravastatin  40 mg Oral q1800  . senna  2 tablet Oral Daily  . vitamin B-12  1,000 mcg Oral Daily  . warfarin  2 mg Oral ONCE-1800  . Warfarin - Pharmacist Dosing Inpatient   Does not apply q1800   Continuous Infusions: . sodium chloride 75 mL/hr at 09/16/18 0929  . sodium chloride    . cefTRIAXone (ROCEPHIN)  IV 2 g (09/15/18 2157)   PRN Meds: acetaminophen **OR** acetaminophen, ALPRAZolam, diphenhydrAMINE, ondansetron **OR** ondansetron (ZOFRAN) IV, oxyCODONE, polyethylene glycol, prochlorperazine, traMADol  Allergies:   No Known Allergies  Social History:   Social History   Socioeconomic History  . Marital status: Widowed    Spouse name: Not on file  . Number of children: 2  . Years of education: Not on file  . Highest education level: Not on file  Occupational History  . Not on file  Social Needs  . Financial resource strain: Not on file  . Food insecurity:    Worry: Not on file    Inability: Not on file  . Transportation needs:    Medical: Not on file    Non-medical: Not on file  Tobacco Use  . Smoking status: Former Smoker    Packs/day: 1.00    Years: 20.00    Pack years: 20.00     Types: Cigarettes    Last attempt to quit: 08/29/1993    Years since quitting: 25.0  . Smokeless tobacco: Never Used  Substance and Sexual Activity  . Alcohol use: Not Currently  . Drug use: Never  . Sexual activity: Not Currently  Lifestyle  . Physical activity:    Days per week: Not on file    Minutes per session: Not on file  . Stress: Not on file  Relationships  . Social connections:    Talks on phone: Not on file    Gets together: Not on file    Attends religious service: Not on file    Active member of club or organization: Not on file    Attends meetings of clubs or organizations:  Not on file    Relationship status: Not on file  . Intimate partner violence:    Fear of current or ex partner: Not on file    Emotionally abused: Not on file    Physically abused: Not on file    Forced sexual activity: Not on file  Other Topics Concern  . Not on file  Social History Narrative  . Not on file    Family History:    Family History  Problem Relation Age of Onset  . Alzheimer's disease Mother   . Heart disease Father      ROS:  Please see the history of present illness. All other ROS reviewed and negative.     Physical Exam/Data:   Vitals:   09/15/18 1415 09/15/18 2026 09/16/18 0342 09/16/18 0811  BP: (!) 134/54 (!) 164/69 (!) 148/49 (!) 177/58  Pulse: (!) 59 68 61 (!) 59  Resp: 18 17 17 18   Temp: 98.3 F (36.8 C) 98.6 F (37 C) 97.6 F (36.4 C) 98.8 F (37.1 C)  TempSrc:  Oral Oral Oral  SpO2: 94% 93% 93% 95%  Weight:      Height:        Intake/Output Summary (Last 24 hours) at 09/16/2018 1035 Last data filed at 09/16/2018 0400 Gross per 24 hour  Intake 340 ml  Output -  Net 340 ml   Filed Weights   09/13/18 0852 09/13/18 1348 09/15/18 1119  Weight: 49.7 kg 55.3 kg 54.4 kg   Body mass index is 21.24 kg/m.  General: Thin woman, lying comfortably in bed. HEENT: normal Lymph: no adenopathy Neck: no JVD Endocrine:  No thryomegaly Vascular: No carotid  bruits; 2+ radial and pedal pulses bilaterally. Cardiac:  normal S1, S2; RRR; no murmur or rub, or gallop Lungs:  clear to auscultation bilaterally, no wheezing, rhonchi or rales  Abd: soft with mild lower abdominal tenderness.  No HSM. Ext: no edema Musculoskeletal:  No deformities, BUE and BLE strength normal and equal Skin: warm and dry  Neuro:  CNs 2-12 intact, no focal abnormalities noted Psych:  Normal affect   EKG:  The EKG performed 09/13/2018 was personally reviewed and demonstrates: Normal sinus rhythm with right axis deviation.  Otherwise, no significant abnormality. Telemetry: Patient not currently on telemetry.  Relevant CV Studies: Transthoracic echo (09/14/2018): - Left ventricle: The cavity size was normal. There was mild   concentric hypertrophy. Systolic function was normal. The   estimated ejection fraction was in the range of 55% to 60%. Left   ventricular diastolic function parameters were normal. - Aortic valve: There was trivial regurgitation. - Left atrium: The atrium was moderately dilated. - Pulmonary arteries: Systolic pressure was mildly increased. PA   peak pressure: 40 mm Hg (S). - There was no evidence of a vegetation.  Laboratory Data:  Chemistry Recent Labs  Lab 09/13/18 0858 09/14/18 0528 09/15/18 0447  NA 136 139 136  K 4.2 3.8 3.6  CL 105 111 111  CO2 24 24 20*  GLUCOSE 121* 95 83  BUN 31* 22 16  CREATININE 0.94 0.86 0.70  CALCIUM 8.6* 7.3* 7.3*  GFRNONAA 58* >60 >60  GFRAA >60 >60 >60  ANIONGAP 7 4* 5    Recent Labs  Lab 09/12/18 0830 09/13/18 0858  PROT 6.8 6.2*  ALBUMIN 2.9* 2.7*  AST 20 15  ALT 10 10  ALKPHOS 72 67  BILITOT 0.8 0.7   Hematology Recent Labs  Lab 09/13/18 0858 09/14/18 0528 09/15/18 0447  WBC  10.7 8.0 8.6  RBC 3.96 3.55* 3.49*  HGB 10.8* 9.7* 9.6*  HCT 32.1* 28.9* 28.6*  MCV 81.2 81.5 81.9  MCH 27.4 27.2 27.6  MCHC 33.8 33.4 33.7  RDW 15.7* 16.1* 16.0*  PLT 193 154 149*   Cardiac EnzymesNo  results for input(s): TROPONINI in the last 168 hours. No results for input(s): TROPIPOC in the last 168 hours.  BNPNo results for input(s): BNP, PROBNP in the last 168 hours.  DDimer No results for input(s): DDIMER in the last 168 hours.  Radiology/Studies:  Dg Chest 1 View  Result Date: 09/13/2018 CLINICAL DATA:  fever and weakness onset 2-3 days ago. Denies CP, SOB or cough at this time. Patient reports recent dx of vulvar ca that was due to begin chemo treatment for yesterday, but was unable to begin because she was too hypotensive. Hx HTN, port-a-cath insertion. Former smoker. EXAM: CHEST  1 VIEW COMPARISON:  none FINDINGS: Lungs are clear. Right IJ power injectable port catheter to the distal SVC. No pneumothorax. Heart size upper limits normal. Retrocardiac double density presumably hiatal hernia. Aortic Atherosclerosis (ICD10-170.0). No effusion. Visualized bones unremarkable. IMPRESSION: No acute cardiopulmonary disease. Electronically Signed   By: Lucrezia Europe M.D.   On: 09/13/2018 09:37   Ct Head Wo Contrast  Result Date: 09/15/2018 CLINICAL DATA:  77 y/o F; anxiety, agitation, possible personality change. Rule out intracranial process. History of vulvar cancer. EXAM: CT HEAD WITHOUT CONTRAST TECHNIQUE: Contiguous axial images were obtained from the base of the skull through the vertex without intravenous contrast. COMPARISON:  None. FINDINGS: Brain: No evidence of acute infarction, hemorrhage, hydrocephalus, extra-axial collection or mass lesion/mass effect. Nonspecific white matter hypodensities are compatible with mild chronic microvascular ischemic changes. Mild volume loss of the brain for age. Vascular: Calcific atherosclerosis of the carotid siphons. No hyperdense vessel identified. Skull: Normal. Negative for fracture or focal lesion. Sinuses/Orbits: No acute finding. Other: None. IMPRESSION: 1. No acute intracranial abnormality identified. 2. Mild for age chronic microvascular ischemic  changes and volume loss of the brain. Electronically Signed   By: Kristine Garbe M.D.   On: 09/15/2018 18:52    Assessment and Plan:   Bacteremia Most likely urinary source or secondary to ulcerated vulvar mass.  Transthoracic echocardiogram without vegetation.  Infectious disease consultation has recommended TEE.  Proceed with transesophageal echocardiogram today.  I have reviewed the procedure, including its risks and benefits, with the patient.  She has agreed to proceed as well as resend her DNR/DNI order during the procedure.  Antimicrobial therapy per internal medicine and ID.  Assessment atrial fibrillation EKG on admission showed sinus rhythm.  Continue anticoagulation with warfarin.  Continue atenolol.  Follow-up with Dr. Fletcher Anon as scheduled next month.  [facility] will sign off after completion of transesophageal echocardiogram later today.   Medication Recommendations: Continue warfarin with target INR of 2-3.  Antimicrobial therapy per internal medicine and ID. Other recommendations (labs, testing, etc): None. Follow up as an outpatient: Keep scheduled appointment with Dr. Fletcher Anon on 10/21/2018.  For questions or updates, please contact [place] Please consult [email or web address] for contact info under [facility] Cardiology.   Signed, Nelva Bush, MD  09/16/2018 10:35 AM

## 2018-09-16 NOTE — Interval H&P Note (Signed)
History and Physical Interval Note:  09/16/2018 11:19 AM  Virginia Murray  has presented today for surgery, with the diagnosis of Bacteremia  The various methods of treatment have been discussed with the patient and family. After consideration of risks, benefits and other options for treatment, the patient has consented to  Procedure(s): TRANSESOPHAGEAL ECHOCARDIOGRAM (TEE) (N/A) as a surgical intervention .  The patient's history has been reviewed, patient examined, no change in status, stable for surgery.  I have reviewed the patient's chart and labs.  Questions were answered to the patient's satisfaction.     Previn Jian

## 2018-09-16 NOTE — Progress Notes (Signed)
Virginia Murray is a 77 y.o. 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  Patient was living in [place] until 4 weeks ago and then moved to [place].  Patient chart reviewed, Oncologist notes reviewed .Patient was diagnosed with metastatic vulvar cancer in 2007 underwent post radical  vulvectomy and bilateral inguinal lymphnode dissection 2007 with complete radiation to the vulva and right groin August 2007 in [place]. Since then she was on surveillance. Because of her husband's ill health and eventually passing away with tongue cancer patient did not have good follow-up. In July 2019 she was found to have a 7 cm ulcerated lesion in the left vulva partially involving the vagina and rectum on examination and this lesion was biopsied on 07/29/2018. She also the PET scan that showed a perianal ulcerated mass of 8.70X 4.2X 5.3 extending into the gluteal cleft. She was advised chemotherapy. She moved to [facility] and saw Dr.Yu on 08/29/18. A port was placed on 9/ 5/19. She was scheduled to start chemo on 9/13 and during that visit had low BP and it was postponed. She came to the ED the next day With fever, chills weakness and suprapubic abdominal pain with increased urinary frequency and pain in the vulvar area. In the ED she had a temperature of 102.6 chest x-ray was negative WBC count was 10.7. She was started on empiric vancomycin cefepime and Flagyl after sending blood culture and was admitted to the hospital. She ahs Group G streptococcus in blood   Subjective Pt says she is feeling better Slept well with XANAX and not anxious she says  OBJECTIVE:  Patient Vitals for the past 24 hrs:  BP Temp Temp src Pulse Resp SpO2 Height Weight  09/16/18 1432 (!) 139/44 98.7 F (37.1 C) Oral 66 20 97 % - -  09/16/18 1155 (!) 136/57 - - 60 (!) 26 92 % - -  09/16/18 1134 - -  - (!) 58 (!) 26 97 % - -  09/16/18 1129 (!) 180/64 - - 60 17 98 % - -  09/16/18 1123 (!) 168/115 - - 62 (!) 23 95 % - -  09/16/18 1107 (!) 173/62 98.8 F (37.1 C) Oral 62 (!) 22 100 % 5\' 3"  (1.6 m) 54.4 kg  09/16/18 1105 - - - 66 16 99 % - -  09/16/18 0811 (!) 177/58 98.8 F (37.1 C) Oral (!) 59 18 95 % - -  09/16/18 0342 (!) 148/49 97.6 F (36.4 C) Oral 61 17 93 % - -  09/15/18 2026 (!) 164/69 98.6 F (37 C) Oral 68 17 93 % - -   Physical Exam  Constitutional: Pale, thin , alert Eyes: Pupils equal reacting to light  ENT: Poor dentition, tongue dry, oral thrush  CVS: S1-S2 no murmur  Port on the right side of the chest. No erythema or tenderness  RS: Bilateral air entry no crepitation  GI: Soft no tenderness  GU: Examination of the extremity area shows a deep ulcer on the left side , brawny induration, radiation changes, absent vulva  Patient agreed for a picture    MSK: Full range of movement, scar on the right knee  SKIN: No rash  Neurologic: Irritable oriented in place and person but forgetful nonfocal examination  Psychiatric: calm Lymphatic: No cervical or axillary lymph nodes palpable. Inguinal node on the left palpable  Lab  Results    Microbiology:         Recent Results (from the past 240 hour(s))  Culture, blood (Routine x 2) Status: Abnormal (Preliminary result)   Collection Time: 09/13/18 8:55 AM  Result Value Ref Range Status   Specimen Description   Final    BLOOD RH  Performed at [facility], [address], [place], [place] [address]    Special Requests   Final    BOTTLES DRAWN AEROBIC AND ANAEROBIC Blood Culture adequate volume  Performed at [facility], [address] [place], [place] [address]    Culture Setup Time   Final    Organism ID to follow  IN BOTH AEROBIC AND ANAEROBIC BOTTLES  GRAM POSITIVE COCCI  CRITICAL RESULT CALLED TO, READ BACK BY AND VERIFIED WITH: SHEEMA HALLAJI ON 09/13/18 AT 2114 [facility]    Culture (A)  Final      STREPTOCOCCUS GROUP G  SUSCEPTIBILITIES TO FOLLOW  Performed at [facility], [address], [place], [place] [address]    Report Status PENDING  Incomplete  Blood Culture ID Panel (Reflexed) Status: Abnormal   Collection Time: 09/13/18 8:55 AM  Result Value Ref Range Status   Enterococcus species NOT DETECTED NOT DETECTED Final   Listeria monocytogenes NOT DETECTED NOT DETECTED Final   Staphylococcus species NOT DETECTED NOT DETECTED Final   Staphylococcus aureus NOT DETECTED NOT DETECTED Final   Streptococcus species DETECTED (A) NOT DETECTED Final    Comment: Not Enterococcus species, Streptococcus agalactiae, Streptococcus pyogenes, or Streptococcus pneumoniae.  CRITICAL RESULT CALLED TO, READ BACK BY AND VERIFIED WITH:  SHEEMA HALLAJI AT 2114 09/13/18 BY [email or web address]    Streptococcus agalactiae NOT DETECTED NOT DETECTED Final   Streptococcus pneumoniae NOT DETECTED NOT DETECTED Final   Streptococcus pyogenes NOT DETECTED NOT DETECTED Final   Acinetobacter baumannii NOT DETECTED NOT DETECTED Final   Enterobacteriaceae species NOT DETECTED NOT DETECTED Final   Enterobacter cloacae complex NOT DETECTED NOT DETECTED Final   Escherichia coli NOT DETECTED NOT DETECTED Final   Klebsiella oxytoca NOT DETECTED NOT DETECTED Final   Klebsiella pneumoniae NOT DETECTED NOT DETECTED Final   Proteus species NOT DETECTED NOT DETECTED Final   Serratia marcescens NOT DETECTED NOT DETECTED Final   Haemophilus influenzae NOT DETECTED NOT DETECTED Final   Neisseria meningitidis NOT DETECTED NOT DETECTED Final   Pseudomonas aeruginosa NOT DETECTED NOT DETECTED Final   Candida albicans NOT DETECTED NOT DETECTED Final   Candida glabrata NOT DETECTED NOT DETECTED Final   Candida krusei NOT DETECTED NOT DETECTED Final   Candida parapsilosis NOT DETECTED NOT DETECTED Final   Candida tropicalis NOT DETECTED NOT DETECTED Final    Comment: Performed at [facility], [address]  [place], [place] [address]  Culture, blood (Routine x 2) Status: Abnormal (Preliminary result)   Collection Time: 09/13/18 8:59 AM  Result Value Ref Range Status   Specimen Description   Final    BLOOD LAC  Performed at [facility], [address] [place], [place] [address]    Special Requests   Final    BOTTLES DRAWN AEROBIC AND ANAEROBIC Blood Culture results may not be optimal due to an excessive volume of blood received in culture bottles  Performed at [facility], [address] [place], [place] [address]    Culture Setup Time   Final    GRAM POSITIVE COCCI  AEROBIC BOTTLE ONLY  CRITICAL VALUE NOTED. VALUE IS CONSISTENT WITH PREVIOUSLY REPORTED  AND CALLED VALUE.  Performed at [facility], [address] [place], [place] [address]    Culture STREPTOCOCCUS GROUP G (A)  Final   Report Status PENDING  Incomplete  Culture, blood (Routine X 2) w Reflex to ID Panel Status: None (Preliminary result)   Collection Time: 09/14/18 9:33 AM  Result Value Ref Range Status   Specimen Description BLOOD LT [facility]  Final   Special Requests   Final    BOTTLES DRAWN AEROBIC AND ANAEROBIC Blood Culture results may not be optimal due to an excessive volume of blood received in culture bottles   Culture   Final    NO GROWTH < 24 HOURS  Performed at [facility], [address], [place], [place] [address]    Report Status PENDING  Incomplete  Culture, blood (Routine X 2) w Reflex to ID Panel Status: None (Preliminary result)   Collection Time: 09/14/18 9:41 AM  Result Value Ref Range Status   Specimen Description BLOOD RT [facility]  Final   Special Requests   Final    BOTTLES DRAWN AEROBIC AND ANAEROBIC Blood Culture adequate volume   Culture   Final    NO GROWTH < 24 HOURS  Performed at [facility], [address], [place], [place] [address]    Report Status PENDING  Incomplete   Radiographs and labs were personally reviewed by me.  Assessment and Plan  77  y.o. 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    Group G streptococcus bacteremia.. I have asked lab to Identify the species .Marland Kitchen Likely source genital/GI tract. As she has an ulcerating mass along with induration rule out any collection in the vagina . TEE is negative Port less likely the source ( will send one culture from the port to see whetehr it is colonized with Strep group G- if not we will not remove the port but will treat her thru it and recheck culture after 2 weeks  Recommend pelvic CT to look for any collection in the vagina or pelvic  ( has had hysterectomy)  Continue ceftriaxone - will need for 2 weeks   Recurrent vulvar carcinoma with a deep ulcer- cultured the ulcer to see whether same organism is seen   Anemia of chronic disease  Pt seen with her nurse Freddie Breech Discussed the management with the patient

## 2018-09-16 NOTE — Progress Notes (Signed)
[place] at [place] NAME: Virginia Murray    MR#:  [id number]  DATE OF BIRTH:  1941-10-07  SUBJECTIVE:   TEE planned for today  REVIEW OF SYSTEMS:    Review of Systems  Constitutional: Negative for fever, chills weight loss HENT: Negative for ear pain, nosebleeds, congestion, facial swelling, rhinorrhea, neck pain, neck stiffness and ear discharge.   Respiratory: Negative for cough, shortness of breath, wheezing  Cardiovascular: Negative for chest pain, palpitations and leg swelling.  Gastrointestinal: Negative for heartburn, abdominal pain, vomiting, diarrhea or consitpation Genitourinary: Negative for dysuria, urgency, frequency, hematuria Musculoskeletal: Negative for back pain or joint pain Neurological: Negative for dizziness, seizures, syncope, focal weakness,  numbness and headaches.  Hematological: Does not bruise/bleed easily.  Psychiatric/Behavioral: Negative for hallucinations, confusion, dysphoric mood, positive anxiety   Tolerating Diet: yes      DRUG ALLERGIES:  No Known Allergies  VITALS:  Blood pressure (!) 177/58, pulse (!) 59, temperature 98.8 F (37.1 C), temperature source Oral, resp. rate 18, height 5\' 3"  (1.6 m), weight 54.4 kg, SpO2 95 %.  PHYSICAL EXAMINATION:  Constitutional: Appears well-developed and well-nourished. No distress. HENT: Normocephalic. Marland Kitchen Oropharynx is clear and moist.  Eyes: Conjunctivae and EOM are normal. PERRLA, no scleral icterus.  Neck: Normal ROM. Neck supple. No JVD. No tracheal deviation. CVS: RRR, S1/S2 +, no murmurs, no gallops, no carotid bruit.  Pulmonary: Effort and breath sounds normal, no stridor, rhonchi, wheezes, rales.  Abdominal: Soft. BS +,  no distension, tenderness, rebound or guarding.  Musculoskeletal: Normal range of motion. No edema and no tenderness.  Neuro: Alert. CN 2-12 grossly intact. No focal deficits. Skin: Skin is warm and dry. No rash noted.\  She  has a port Psychiatric: Normal mood and affect.      LABORATORY PANEL:   CBC Recent Labs  Lab 09/15/18 0447  WBC 8.6  HGB 9.6*  HCT 28.6*  PLT 149*   ------------------------------------------------------------------------------------------------------------------  Chemistries  Recent Labs  Lab 09/13/18 0858  09/15/18 0447  NA 136   < > 136  K 4.2   < > 3.6  CL 105   < > 111  CO2 24   < > 20*  GLUCOSE 121*   < > 83  BUN 31*   < > 16  CREATININE 0.94   < > 0.70  CALCIUM 8.6*   < > 7.3*  AST 15  --   --   ALT 10  --   --   ALKPHOS 67  --   --   BILITOT 0.7  --   --    < > = values in this interval not displayed.   ------------------------------------------------------------------------------------------------------------------  Cardiac Enzymes No results for input(s): TROPONINI in the last 168 hours. ------------------------------------------------------------------------------------------------------------------  RADIOLOGY:  Ct Head Wo Contrast  Result Date: 09/15/2018 CLINICAL DATA:  77 y/o F; anxiety, agitation, possible personality change. Rule out intracranial process. History of vulvar cancer. EXAM: CT HEAD WITHOUT CONTRAST TECHNIQUE: Contiguous axial images were obtained from the base of the skull through the vertex without intravenous contrast. COMPARISON:  None. FINDINGS: Brain: No evidence of acute infarction, hemorrhage, hydrocephalus, extra-axial collection or mass lesion/mass effect. Nonspecific white matter hypodensities are compatible with mild chronic microvascular ischemic changes. Mild volume loss of the brain for age. Vascular: Calcific atherosclerosis of the carotid siphons. No hyperdense vessel identified. Skull: Normal. Negative for fracture or focal lesion. Sinuses/Orbits: No acute finding. Other: None. IMPRESSION: 1. No acute intracranial  abnormality identified. 2. Mild for age chronic microvascular ischemic changes and volume loss of the brain.  Electronically Signed   By: Kristine Garbe M.D.   On: 09/15/2018 18:52     ASSESSMENT AND PLAN:   77 year old female with vulvar cancer receiving chemotherapy, PAF and essential hypertension who presents to the ER with fever and chills.   1.  Sepsis: Patient presented with fever and tachypnea Blood cultures are positive for strep species Continue Rocephin for now  Patient is planned for TEE today.  If there is no evidence of endocarditis she may be able to be transitioned to oral ampicillin.    echocardiogram with normal ejection fraction no signs of vegetation Repeat blood cultures are negative to date   2.  PAF heart rate is controlled on atenolol Pharmacy dosing Coumadin  3.  History of vulvar cancer: Continue pain medications Oncology consultation appreciated  4.  Central hypertension: Continue atenolol  5.  Iron deficiency anemia chronic and B12 deficiency: Continue B12 supplementation Patient may benefit from IV iron as an outpatient   6.  Grieving/anxiety: Patient does not want psychiatry evaluation. She needs Xanax as this is helping her  Management plans discussed with the patient and she is in agreement.  CODE STATUS: DNR  TOTAL TIME TAKING CARE OF THIS PATIENT: 24 minutes.  Discussed with Dr. END and ID  POSSIBLE D/C tomorrow DEPENDING ON CLINICAL CONDITION.   Mikya Don M.D on 09/16/2018 at 10:59 AM  Between 7am to 6pm - Pager - [phone] After 6pm go to [email or web address] - password [email or web address] [facility]  Office  [phone]  CC: Primary care physician; Patient, No Pcp Per  Note: This dictation was prepared with Dragon dictation along with smaller phrase technology. Any transcriptional errors that result from this process are unintentional.

## 2018-09-16 NOTE — Care Management Important Message (Signed)
Copy of signed IM left with patient in room.  

## 2018-09-16 NOTE — Treatment Plan (Signed)
Diagnosis: Group G Strep bacteremia Baseline Creatinine 0.70    No Known Allergies  OPAT Orders Discharge antibiotics: Ceftriaxone 2 grams IV every day until 09/28/18 thru PORT   Labs weekly while on IV antibiotics: _X_ CBC with differential _X_ BMP  Leave PORT in place  _ Scan and email results to [email or web address] Call me any questions [phone]

## 2018-09-16 NOTE — CV Procedure (Signed)
    Transesophageal Echocardiogram Note  Virginia Murray [id number] 19-Dec-1941  Procedure: Transesophageal Echocardiogram Indications: Bacteremia  Procedure Details Consent: Obtained Time Out: Verified patient identification, verified procedure, site/side was marked, verified correct patient position, special equipment/implants available, Radiology Safety Procedures followed,  medications/allergies/relevent history reviewed, required imaging and test results available.  Performed  Medications:  During this procedure the patient is administered a total of Versed 2 mg and Fentanyl 50 mcg  to achieve and maintain moderate conscious sedation.  The patient's heart rate, blood pressure, and oxygen saturation are monitored continuously during the procedure. The period of conscious sedation is 20 minutes, of which I was present face-to-face 100% of this time.  Left Ventrical:  Normal function.  Mitral Valve: Normal morphology with mild MR.  No vegetation.  Aortic Valve: Mildly thickened with trivial AI.  No vegetation.  Tricuspid Valve: Normal morphology with trivial TR.  No vegetation.  Pulmonic Valve: Normal morphology with mild PR.  No vegetation.  Left Atrium/ Left atrial appendage: No thrombus.  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  Negative bubble study.  Aorta: Mild atherosclerosis noted.   Complications: No apparent complications Patient did tolerate procedure well.   Nelva Bush, MD 09/16/2018, 11:46 AM

## 2018-09-16 NOTE — Progress Notes (Signed)
*  PRELIMINARY RESULTS* Echocardiogram Echocardiogram Transesophageal has been performed.  Sherrie Sport 09/16/2018, 11:44 AM

## 2018-09-17 ENCOUNTER — Encounter: Payer: Self-pay | Admitting: Internal Medicine

## 2018-09-17 ENCOUNTER — Encounter: Payer: Self-pay | Admitting: *Deleted

## 2018-09-17 DIAGNOSIS — Z7901 Long term (current) use of anticoagulants: Secondary | ICD-10-CM

## 2018-09-17 DIAGNOSIS — I482 Chronic atrial fibrillation: Secondary | ICD-10-CM

## 2018-09-17 DIAGNOSIS — B954 Other streptococcus as the cause of diseases classified elsewhere: Secondary | ICD-10-CM

## 2018-09-17 LAB — PROTIME-INR
INR: 2.4
Prothrombin Time: 26 seconds — ABNORMAL HIGH (ref 11.4–15.2)

## 2018-09-17 MED ORDER — SODIUM CHLORIDE 0.9 % IV SOLN: 2.0000 g | INTRAVENOUS | 0 refills | Status: DC

## 2018-09-17 MED ORDER — CYANOCOBALAMIN 1000 MCG PO TABS: 1000.0000 ug | ORAL_TABLET | Freq: Every day | ORAL | 0 refills | Status: AC

## 2018-09-17 MED ORDER — WARFARIN SODIUM 3 MG PO TABS
3.0000 mg | ORAL_TABLET | Freq: Once | ORAL | Status: DC
  Filled 2018-09-17: qty 1

## 2018-09-17 MED ORDER — CEFTRIAXONE IV (FOR PTA / DISCHARGE USE ONLY): 2.0000 g | INTRAVENOUS | 0 refills | Status: AC

## 2018-09-17 MED ORDER — ALPRAZOLAM 0.25 MG PO TABS: 0.2500 mg | ORAL_TABLET | Freq: Two times a day (BID) | ORAL | 0 refills | Status: DC | PRN

## 2018-09-17 NOTE — Discharge Summary (Signed)
[place] at [place] NAME: Virginia Murray    MR#:  [id number]  DATE OF BIRTH:  09-18-1941  DATE OF ADMISSION:  09/13/2018 ADMITTING PHYSICIAN: Sela Hua, MD  DATE OF DISCHARGE: 09/17/2018  PRIMARY CARE PHYSICIAN: Earlie Server, MD    ADMISSION DIAGNOSIS:  Fever [R50.9]  DISCHARGE DIAGNOSIS:  Active Problems:   Fever   Bacteremia   Anxiety state   Agitation   SECONDARY DIAGNOSIS:   Past Medical History:  Diagnosis Date  . A-fib ([place])   . Cancer (HCC)    vulva   . Depression   . Diverticulitis   . Hyperlipemia   . Hypertension   . Vulva cancer [facility])     HOSPITAL COURSE:   77 year old female with vulvar cancer receiving chemotherapy, PAF and essential hypertension who presents to the ER with fever and chills.   1.  Sepsis: Patient presented with fever and tachypnea Blood cultures were positive for group G Streptococcus bacteremia.  Patient was eval by ID.  She underwent TEE which was negative for endocarditis. Patient will require 2 weeks total of IV ceftriaxone through her port.  Port was less likely source as per ID.  Patient will have follow-up with her PCP and ID physician in 1 week. Repeat blood cultures have been negative. 2.  PAF heart rate is controlled on atenolol She will continue Coumadin  3.  History of vulvar cancer: Continue pain medications Patient will have outpatient follow-up with Dr. Tasia Catchings   4.    Essential hypertension: Continue atenolol  5.  Iron deficiency anemia chronic and B12 deficiency: Continue B12 supplementation Patient may benefit from IV iron as an outpatient   6.  Grieving/anxiety: Patient does not want psychiatry evaluation. She felt that Xanax helped and therefore will be prescribed a few tablets of Xanax and then she will need to follow-up with her PCP   DISCHARGE CONDITIONS AND DIET:   Stable for discharge on regular diet  CONSULTS OBTAINED:  Treatment Team:  Sindy Guadeloupe, MD Tsosie Billing, MD  DRUG ALLERGIES:  No Known Allergies  DISCHARGE MEDICATIONS:   Allergies as of 09/17/2018   No Known Allergies     Medication List    TAKE these medications   ALPRAZolam 0.25 MG tablet Commonly known as:  XANAX Take 1 tablet (0.25 mg total) by mouth 2 (two) times daily as needed for anxiety.   amLODipine 2.5 MG tablet Commonly known as:  NORVASC Take 2.5 mg by mouth daily.   atenolol 100 MG tablet Commonly known as:  TENORMIN Take 100 mg by mouth daily.   benazepril 40 MG tablet Commonly known as:  LOTENSIN Take 40 mg by mouth daily.   cefTRIAXone  IVPB Commonly known as:  ROCEPHIN Inject 2 g into the vein daily for 10 days. Indication:  Group G strep bacteremia Last Day of Therapy:  09/28/2018 Labs weekly while on IV antibiotics: _X_ CBC with differential _X_ BMP Start taking on:  09/18/2018   cyanocobalamin 1000 MCG tablet Take 1 tablet (1,000 mcg total) by mouth daily.   dexamethasone 4 MG tablet Commonly known as:  DECADRON Take 2 tablets (8 mg total) by mouth daily. Start the day after chemotherapy for 2 days.   doxazosin 8 MG tablet Commonly known as:  CARDURA Take 8 mg by mouth daily.   fentaNYL 25 MCG/HR patch Commonly known as:  DURAGESIC - dosed mcg/hr Place 1 patch (25 mcg total) onto the skin every  3 (three) days.   lidocaine-prilocaine cream Commonly known as:  EMLA Apply to affected area once   ondansetron 8 MG tablet Commonly known as:  ZOFRAN Take 1 tablet (8 mg total) by mouth 2 (two) times daily as needed for refractory nausea / vomiting. Start on day 3 after chemo.   oxyCODONE 5 MG immediate release tablet Commonly known as:  Oxy IR/ROXICODONE Take 1 tablet (5 mg total) by mouth every 4 (four) hours as needed for moderate pain or severe pain.   polyethylene glycol packet Commonly known as:  MIRALAX / GLYCOLAX Take 17 g by mouth daily as needed for mild constipation or moderate constipation.    pravastatin 40 MG tablet Commonly known as:  PRAVACHOL Take 40 mg by mouth daily.   prochlorperazine 10 MG tablet Commonly known as:  COMPAZINE Take 1 tablet (10 mg total) by mouth every 6 (six) hours as needed (Nausea or vomiting).   senna 8.6 MG Tabs tablet Commonly known as:  SENOKOT Take 2 tablets (17.2 mg total) by mouth daily.   warfarin 1 MG tablet Commonly known as:  COUMADIN Take 1 tablet (1 mg total) by mouth daily. What changed:  how much to take            Home Infusion Instuctions  (From admission, onward)         Start     Ordered   09/17/18 0000  Home infusion instructions [facility] May follow [place] Dosing Protocol; May administer Cathflo as needed to maintain patency of vascular access device.; Flushing of vascular access device: per [facility] Protocol: 0.9% NaCl pre/post medica...    Question Answer Comment  Instructions May follow [place] Dosing Protocol   Instructions May administer Cathflo as needed to maintain patency of vascular access device.   Instructions Flushing of vascular access device: per [facility] Protocol: 0.9% NaCl pre/post medication administration and prn patency; Heparin 100 u/ml, 87ml for implanted ports and Heparin 10u/ml, 41ml for all other central venous catheters.   Instructions May follow [facility] Anaphylaxis Protocol for First Dose Administration in the home: 0.9% NaCl at 25-50 ml/hr to maintain IV access for protocol meds. Epinephrine 0.3 ml IV/IM PRN and Benadryl 25-50 IV/IM PRN s/s of anaphylaxis.   Instructions [facility] Infusion Coordinator (RN) to assist per patient IV care needs in the home PRN.      09/17/18 0843            Today   CHIEF COMPLAINT:  Patient is doing well no acute events overnight   VITAL SIGNS:  Blood pressure (!) 163/56, pulse 60, temperature 97.9 F (36.6 C), resp. rate 17, height 5\' 3"  (1.6 m), weight 54.4 kg, SpO2 95 %.   REVIEW OF SYSTEMS:  Review of Systems   Constitutional: Negative.  Negative for chills, fever and malaise/fatigue.  HENT: Negative.  Negative for ear discharge, ear pain, hearing loss, nosebleeds and sore throat.   Eyes: Negative.  Negative for blurred vision and pain.  Respiratory: Negative.  Negative for cough, hemoptysis, shortness of breath and wheezing.   Cardiovascular: Negative.  Negative for chest pain, palpitations and leg swelling.  Gastrointestinal: Negative.  Negative for abdominal pain, blood in stool, diarrhea, nausea and vomiting.  Genitourinary: Negative.  Negative for dysuria.  Musculoskeletal: Negative.  Negative for back pain.  Skin: Negative.   Neurological: Negative for dizziness, tremors, speech change, focal weakness, seizures and headaches.  Endo/Heme/Allergies: Negative.  Does not bruise/bleed easily.  Psychiatric/Behavioral: Negative.  Negative  for depression, hallucinations and suicidal ideas.     PHYSICAL EXAMINATION:  GENERAL:  77 y.o.-year-old patient lying in the bed with no acute distress.  NECK:  Supple, no jugular venous distention. No thyroid enlargement, no tenderness.  LUNGS: Normal breath sounds bilaterally, no wheezing, rales,rhonchi  No use of accessory muscles of respiration.  CARDIOVASCULAR: S1, S2 normal. No murmurs, rubs, or gallops.  ABDOMEN: Soft, non-tender, non-distended. Bowel sounds present. No organomegaly or mass.  EXTREMITIES: No pedal edema, cyanosis, or clubbing.  PSYCHIATRIC: The patient is alert and oriented x 3.  SKIN: No obvious rash, lesion, or ulcer.   DATA REVIEW:   CBC Recent Labs  Lab 09/15/18 0447  WBC 8.6  HGB 9.6*  HCT 28.6*  PLT 149*    Chemistries  Recent Labs  Lab 09/13/18 0858  09/15/18 0447  NA 136   < > 136  K 4.2   < > 3.6  CL 105   < > 111  CO2 24   < > 20*  GLUCOSE 121*   < > 83  BUN 31*   < > 16  CREATININE 0.94   < > 0.70  CALCIUM 8.6*   < > 7.3*  AST 15  --   --   ALT 10  --   --   ALKPHOS 67  --   --   BILITOT 0.7  --   --     < > = values in this interval not displayed.    Cardiac Enzymes No results for input(s): TROPONINI in the last 168 hours.  Microbiology Results  @MICRORSLT48 @  RADIOLOGY:  Ct Head Wo Contrast  Result Date: 09/15/2018 CLINICAL DATA:  77 y/o F; anxiety, agitation, possible personality change. Rule out intracranial process. History of vulvar cancer. EXAM: CT HEAD WITHOUT CONTRAST TECHNIQUE: Contiguous axial images were obtained from the base of the skull through the vertex without intravenous contrast. COMPARISON:  None. FINDINGS: Brain: No evidence of acute infarction, hemorrhage, hydrocephalus, extra-axial collection or mass lesion/mass effect. Nonspecific white matter hypodensities are compatible with mild chronic microvascular ischemic changes. Mild volume loss of the brain for age. Vascular: Calcific atherosclerosis of the carotid siphons. No hyperdense vessel identified. Skull: Normal. Negative for fracture or focal lesion. Sinuses/Orbits: No acute finding. Other: None. IMPRESSION: 1. No acute intracranial abnormality identified. 2. Mild for age chronic microvascular ischemic changes and volume loss of the brain. Electronically Signed   By: Kristine Garbe M.D.   On: 09/15/2018 18:52      Allergies as of 09/17/2018   No Known Allergies     Medication List    TAKE these medications   ALPRAZolam 0.25 MG tablet Commonly known as:  XANAX Take 1 tablet (0.25 mg total) by mouth 2 (two) times daily as needed for anxiety.   amLODipine 2.5 MG tablet Commonly known as:  NORVASC Take 2.5 mg by mouth daily.   atenolol 100 MG tablet Commonly known as:  TENORMIN Take 100 mg by mouth daily.   benazepril 40 MG tablet Commonly known as:  LOTENSIN Take 40 mg by mouth daily.   cefTRIAXone  IVPB Commonly known as:  ROCEPHIN Inject 2 g into the vein daily for 10 days. Indication:  Group G strep bacteremia Last Day of Therapy:  09/28/2018 Labs weekly while on IV antibiotics: _X_  CBC with differential _X_ BMP Start taking on:  09/18/2018   cyanocobalamin 1000 MCG tablet Take 1 tablet (1,000 mcg total) by mouth daily.   dexamethasone 4 MG  tablet Commonly known as:  DECADRON Take 2 tablets (8 mg total) by mouth daily. Start the day after chemotherapy for 2 days.   doxazosin 8 MG tablet Commonly known as:  CARDURA Take 8 mg by mouth daily.   fentaNYL 25 MCG/HR patch Commonly known as:  DURAGESIC - dosed mcg/hr Place 1 patch (25 mcg total) onto the skin every 3 (three) days.   lidocaine-prilocaine cream Commonly known as:  EMLA Apply to affected area once   ondansetron 8 MG tablet Commonly known as:  ZOFRAN Take 1 tablet (8 mg total) by mouth 2 (two) times daily as needed for refractory nausea / vomiting. Start on day 3 after chemo.   oxyCODONE 5 MG immediate release tablet Commonly known as:  Oxy IR/ROXICODONE Take 1 tablet (5 mg total) by mouth every 4 (four) hours as needed for moderate pain or severe pain.   polyethylene glycol packet Commonly known as:  MIRALAX / GLYCOLAX Take 17 g by mouth daily as needed for mild constipation or moderate constipation.   pravastatin 40 MG tablet Commonly known as:  PRAVACHOL Take 40 mg by mouth daily.   prochlorperazine 10 MG tablet Commonly known as:  COMPAZINE Take 1 tablet (10 mg total) by mouth every 6 (six) hours as needed (Nausea or vomiting).   senna 8.6 MG Tabs tablet Commonly known as:  SENOKOT Take 2 tablets (17.2 mg total) by mouth daily.   warfarin 1 MG tablet Commonly known as:  COUMADIN Take 1 tablet (1 mg total) by mouth daily. What changed:  how much to take            Home Infusion Instuctions  (From admission, onward)         Start     Ordered   09/17/18 0000  Home infusion instructions [facility] May follow [place] Dosing Protocol; May administer Cathflo as needed to maintain patency of vascular access device.; Flushing of vascular access device: per [facility] Protocol:  0.9% NaCl pre/post medica...    Question Answer Comment  Instructions May follow [place] Dosing Protocol   Instructions May administer Cathflo as needed to maintain patency of vascular access device.   Instructions Flushing of vascular access device: per [facility] Protocol: 0.9% NaCl pre/post medication administration and prn patency; Heparin 100 u/ml, 77ml for implanted ports and Heparin 10u/ml, 45ml for all other central venous catheters.   Instructions May follow [facility] Anaphylaxis Protocol for First Dose Administration in the home: 0.9% NaCl at 25-50 ml/hr to maintain IV access for protocol meds. Epinephrine 0.3 ml IV/IM PRN and Benadryl 25-50 IV/IM PRN s/s of anaphylaxis.   Instructions [facility] Infusion Coordinator (RN) to assist per patient IV care needs in the home PRN.      09/17/18 0843             Management plans discussed with the patient and she is in agreement. Stable for discharge home with [facility]  Patient should follow up with pcp  CODE STATUS:     Code Status Orders  (From admission, onward)         Start     Ordered   09/14/18 1044  Do not attempt resuscitation (DNR)  Continuous    Question Answer Comment  In the event of cardiac or respiratory ARREST Do not call a "code blue"   In the event of cardiac or respiratory ARREST Do not perform Intubation, CPR, defibrillation or ACLS   In the event of cardiac or respiratory  ARREST Use medication by any route, position, wound care, and other measures to relive pain and suffering. May use oxygen, suction and manual treatment of airway obstruction as needed for comfort.      09/14/18 1043        Code Status History    Date Active Date Inactive Code Status Order ID Comments User Context   09/13/2018 1348 09/14/2018 1043 Partial Code [id number]  Mayo, Pete Pelt, MD Inpatient      TOTAL TIME TAKING CARE OF THIS PATIENT: 38 minutes.    Note: This dictation was prepared with Dragon dictation along with smaller  phrase technology. Any transcriptional errors that result from this process are unintentional.  Cully Luckow M.D on 09/17/2018 at 8:51 AM  Between 7am to 6pm - Pager - [phone] After 6pm go to [email or web address] - password [email or web address] [place] [facility]  Office  [phone]  CC: Primary care physician; Earlie Server, MD

## 2018-09-17 NOTE — Progress Notes (Signed)
PHARMACY CONSULT NOTE FOR:  OUTPATIENT  PARENTERAL ANTIBIOTIC THERAPY (OPAT)  Indication: Group G streptococcus bacteremia Regimen: Ceftriaxone 2gm IV q24 End date: 09/28/2018  IV antibiotic discharge orders are pended. To discharging provider:  please sign these orders via discharge navigator,  Select New Orders & click on the button choice - Manage This Unsigned Work.     Thank you for allowing pharmacy to be a part of this patient's care.  Doreene Eland, PharmD, BCPS.   Work Cell: [phone] 09/17/2018 8:01 AM

## 2018-09-17 NOTE — Progress Notes (Addendum)
Hematology/Oncology Progress Note [facility] [phone] [phone]  Patient Care Team: Earlie Server, MD as PCP - General (Oncology) Clent Jacks, RN as Registered Nurse   Name of the patient: Virginia Murray  [id number]  08/11/1941  Date of visit: 09/17/18   INTERVAL HISTORY-  Patient was seen and examined bedside.  She appears less stressed and anxious today.  Just finished her lunch appetite is fair. She had a TEE done yesterday which was essentially negative for endocarditis Reports pain is better controlled with fentanyl patch and as needed oxycodone.  She also was started on Xanax for anxiety.  Reports a good night sleep yesterday.   Review of systems- Review of Systems  Constitutional: Positive for malaise/fatigue. Negative for chills.  HENT: Negative for sore throat.   Eyes: Negative for redness.  Respiratory: Negative for cough.   Cardiovascular: Negative for chest pain.  Gastrointestinal: Negative for abdominal pain, blood in stool, nausea and vomiting.  Genitourinary: Negative for dysuria.       Genital area pain has improved.  Musculoskeletal: Negative for myalgias.  Neurological: Negative for dizziness.  Psychiatric/Behavioral: Negative for depression.    No Known Allergies  Patient Active Problem List   Diagnosis Date Noted  . Bacteremia   . Anxiety state   . Agitation   . Fever 09/13/2018  . Vulvar cancer ([place]) 09/03/2018  . Goals of care, counseling/discussion 09/03/2018     Past Medical History:  Diagnosis Date  . A-fib ([place])   . Cancer ([facility])    vulva   . Depression   . Diverticulitis   . Hyperlipemia   . Hypertension   . Vulva cancer [facility])      Past Surgical History:  Procedure Laterality Date  . ABDOMINAL HYSTERECTOMY    . CATARACT EXTRACTION, BILATERAL    . KNEE SURGERY     right knee   . PORTA CATH INSERTION N/A 09/04/2018   Procedure: PORTA CATH INSERTION;  Surgeon: Algernon Huxley, MD;   Location: [facility];  Service: Cardiovascular;  Laterality: N/A;  . TEE WITHOUT CARDIOVERSION N/A 09/16/2018   Procedure: TRANSESOPHAGEAL ECHOCARDIOGRAM (TEE);  Surgeon: Nelva Bush, MD;  Location: [facility] ORS;  Service: Cardiovascular;  Laterality: N/A;  . TONSILLECTOMY    . VULVA SURGERY      Social History   Socioeconomic History  . Marital status: Widowed    Spouse name: Not on file  . Number of children: 2  . Years of education: Not on file  . Highest education level: Not on file  Occupational History  . Not on file  Social Needs  . Financial resource strain: Not on file  . Food insecurity:    Worry: Not on file    Inability: Not on file  . Transportation needs:    Medical: Not on file    Non-medical: Not on file  Tobacco Use  . Smoking status: Former Smoker    Packs/day: 1.00    Years: 20.00    Pack years: 20.00    Types: Cigarettes    Last attempt to quit: 08/29/1993    Years since quitting: 25.0  . Smokeless tobacco: Never Used  Substance and Sexual Activity  . Alcohol use: Not Currently  . Drug use: Never  . Sexual activity: Not Currently  Lifestyle  . Physical activity:    Days per week: Not on file    Minutes per session: Not on file  . Stress: Not on file  Relationships  .  Social connections:    Talks on phone: Not on file    Gets together: Not on file    Attends religious service: Not on file    Active member of club or organization: Not on file    Attends meetings of clubs or organizations: Not on file    Relationship status: Not on file  . Intimate partner violence:    Fear of current or ex partner: Not on file    Emotionally abused: Not on file    Physically abused: Not on file    Forced sexual activity: Not on file  Other Topics Concern  . Not on file  Social History Narrative  . Not on file     Family History  Problem Relation Age of Onset  . Alzheimer's disease Mother   . Heart disease Father      Current  Facility-Administered Medications:  .  0.9 %  sodium chloride infusion, , Intravenous, Continuous, End, Christopher, MD, Last Rate: 75 mL/hr at 09/17/18 0400 .  acetaminophen (TYLENOL) tablet 650 mg, 650 mg, Oral, Q6H PRN, 650 mg at 09/13/18 2010 **OR** acetaminophen (TYLENOL) suppository 650 mg, 650 mg, Rectal, Q6H PRN, End, Christopher, MD .  ALPRAZolam Duanne Moron) tablet 0.25 mg, 0.25 mg, Oral, BID PRN, End, Christopher, MD, 0.25 mg at 09/17/18 0936 .  amLODipine (NORVASC) tablet 2.5 mg, 2.5 mg, Oral, Daily, Mody, Sital, MD, 2.5 mg at 09/17/18 0936 .  atenolol (TENORMIN) tablet 100 mg, 100 mg, Oral, Daily, End, Christopher, MD, 100 mg at 09/17/18 1019 .  cefTRIAXone (ROCEPHIN) 2 g in sodium chloride 0.9 % 100 mL IVPB, 2 g, Intravenous, Q24H, End, Harrell Gave, MD, Stopped at 09/16/18 2104 .  diphenhydrAMINE (BENADRYL) injection 25 mg, 25 mg, Intravenous, Q6H PRN, End, Christopher, MD, 25 mg at 09/15/18 1808 .  docusate sodium (COLACE) capsule 100 mg, 100 mg, Oral, BID, End, Christopher, MD .  fentaNYL ([place] - dosed mcg/hr) patch 25 mcg, 25 mcg, Transdermal, Q72H, End, Christopher, MD, 25 mcg at 09/16/18 1609 .  ondansetron (ZOFRAN) tablet 4 mg, 4 mg, Oral, Q6H PRN, 4 mg at 09/13/18 1512 **OR** ondansetron (ZOFRAN) injection 4 mg, 4 mg, Intravenous, Q6H PRN, End, Christopher, MD .  oxyCODONE (Oxy IR/ROXICODONE) immediate release tablet 5 mg, 5 mg, Oral, Q4H PRN, End, Christopher, MD, 5 mg at 09/17/18 0018 .  polyethylene glycol (MIRALAX / GLYCOLAX) packet 17 g, 17 g, Oral, Daily PRN, End, Christopher, MD .  pravastatin (PRAVACHOL) tablet 40 mg, 40 mg, Oral, q1800, End, Christopher, MD, 40 mg at 09/16/18 1903 .  prochlorperazine (COMPAZINE) tablet 10 mg, 10 mg, Oral, Q6H PRN, End, Christopher, MD .  senna (SENOKOT) tablet 17.2 mg, 2 tablet, Oral, Daily, End, Christopher, MD, 17.2 mg at 09/15/18 0815 .  traMADol (ULTRAM) tablet 50 mg, 50 mg, Oral, Q6H PRN, End, Christopher, MD, 50 mg at 09/15/18  2041 .  vitamin B-12 (CYANOCOBALAMIN) tablet 1,000 mcg, 1,000 mcg, Oral, Daily, End, Christopher, MD, 1,000 mcg at 09/17/18 0936 .  warfarin (COUMADIN) tablet 3 mg, 3 mg, Oral, ONCE-1800, Hallaji, Sheema M, RPH .  Warfarin - Pharmacist Dosing Inpatient, , Does not apply, q1800, End, Harrell Gave, MD   Physical exam:  Vitals:   09/16/18 1910 09/16/18 2128 09/17/18 0118 09/17/18 0347  BP: (!) 188/61 (!) 145/60  (!) 163/56  Pulse: 63 60  60  Resp: 18  16 17   Temp: 98.6 F (37 C)   97.9 F (36.6 C)  TempSrc: Oral     SpO2: 92%  95%  Weight:      Height:       Physical Exam  Constitutional: She is oriented to person, place, and time. No distress.  HENT:  Head: Normocephalic and atraumatic.  Mouth/Throat: No oropharyngeal exudate.  Eyes: Pupils are equal, round, and reactive to light. EOM are normal.  Neck: Normal range of motion. Neck supple.  Cardiovascular: Normal rate and regular rhythm.  No murmur heard. Pulmonary/Chest: Effort normal. No respiratory distress. She has no rales. She exhibits no tenderness.  Abdominal: Soft. She exhibits no distension.  Genitourinary:  Genitourinary Comments: Left vulvar ulcerated mass extending to the Vagina  Musculoskeletal: Normal range of motion. She exhibits no edema.  Neurological: She is alert and oriented to person, place, and time. No cranial nerve deficit.  Skin: Skin is warm and dry.  Psychiatric: Affect normal.       CMP Latest Ref Rng & Units 09/15/2018  Glucose 70 - 99 mg/dL 83  BUN 8 - 23 mg/dL 16  Creatinine 0.44 - 1.00 mg/dL 0.70  Sodium 135 - 145 mmol/L 136  Potassium 3.5 - 5.1 mmol/L 3.6  Chloride 98 - 111 mmol/L 111  CO2 22 - 32 mmol/L 20(L)  Calcium 8.9 - 10.3 mg/dL 7.3(L)  Total Protein 6.5 - 8.1 g/dL -  Total Bilirubin 0.3 - 1.2 mg/dL -  Alkaline Phos 38 - 126 U/L -  AST 15 - 41 U/L -  ALT 0 - 44 U/L -   CBC Latest Ref Rng & Units 09/15/2018  WBC 3.6 - 11.0 K/uL 8.6  Hemoglobin 12.0 - 16.0 g/dL 9.6(L)   Hematocrit 35.0 - 47.0 % 28.6(L)  Platelets 150 - 440 K/uL 149(L)   RADIOGRAPHIC STUDIES: I have personally reviewed the radiological images as listed and agreed with the findings in the report. Dg Chest 1 View  Result Date: 09/13/2018 CLINICAL DATA:  fever and weakness onset 2-3 days ago. Denies CP, SOB or cough at this time. Patient reports recent dx of vulvar ca that was due to begin chemo treatment for yesterday, but was unable to begin because she was too hypotensive. Hx HTN, port-a-cath insertion. Former smoker. EXAM: CHEST  1 VIEW COMPARISON:  none FINDINGS: Lungs are clear. Right IJ power injectable port catheter to the distal SVC. No pneumothorax. Heart size upper limits normal. Retrocardiac double density presumably hiatal hernia. Aortic Atherosclerosis (ICD10-170.0). No effusion. Visualized bones unremarkable. IMPRESSION: No acute cardiopulmonary disease. Electronically Signed   By: Lucrezia Europe M.D.   On: 09/13/2018 09:37   Ct Head Wo Contrast  Result Date: 09/15/2018 CLINICAL DATA:  77 y/o F; anxiety, agitation, possible personality change. Rule out intracranial process. History of vulvar cancer. EXAM: CT HEAD WITHOUT CONTRAST TECHNIQUE: Contiguous axial images were obtained from the base of the skull through the vertex without intravenous contrast. COMPARISON:  None. FINDINGS: Brain: No evidence of acute infarction, hemorrhage, hydrocephalus, extra-axial collection or mass lesion/mass effect. Nonspecific white matter hypodensities are compatible with mild chronic microvascular ischemic changes. Mild volume loss of the brain for age. Vascular: Calcific atherosclerosis of the carotid siphons. No hyperdense vessel identified. Skull: Normal. Negative for fracture or focal lesion. Sinuses/Orbits: No acute finding. Other: None. IMPRESSION: 1. No acute intracranial abnormality identified. 2. Mild for age chronic microvascular ischemic changes and volume loss of the brain. Electronically Signed    By: Kristine Garbe M.D.   On: 09/15/2018 18:52    Assessment and plan-  Patient is a 77 y.o. female with known history of recurrent vulvar  cancer currently admitted due to fever chills, positive for bacteremia.  # Group G Streptococcus bacteremia. TEE negative. Discussed with ID.  She may keep the port for now and continue 2 more weeks of IV Ceftriaxone 2 grams daily through port.  Discussed with Dr. Delaine Lame, if she remains stable and afebrile, okay to start palliative chemotherapy after she received 1 week of IV antibiotics.   Plan obtain blood culture through the port 1 week after after she finished 2 weeks of antibiotics.  #Locally advanced recurrence vulvar cancer.  I will pathologist reviewed outside pathology slides of her biopsy in July.  She has squamous carcinoma, keratinizing type, with ulceration, in a superficial sample.  Pathology discussed.  Source of bacteremia likely secondary to genital/GI tract.  I am concerned that her vulvar cancer involving the rectum and eventually may form fistula.  She will need imaging for further evaluation.  Plan palliative chemotherapy outpatient. She can follow-up with me in 1 week for assessment prior to the chemotherapy.  #Neoplasm related pain: Better controlled with fentanyl 25 MCG per hour every 72 hours with oxycodone 5 mg every 4-6 hours as needed for breakthrough pain.  Fentanyl patch prescription was sent to her local pharmacy.  And I recently refilled her oxycodone prescription.Marland Kitchen  #Chronic atrial fibrillation on Coumadin for anticoagulation.  She has appointment to establish care with local cardiologist. Currently I have been managing her INR outpatient until she establish care with primary care physician locally.  Thank you for allowing me to participate in the care of this patient.  Total face to face encounter time for this patient visit was 35 min. >50% of the time was  spent in counseling and coordination of care.  Earlie Server, MD, PhD Hematology Oncology [facility] Pager- [phone] 09/17/2018

## 2018-09-17 NOTE — Care Management Note (Signed)
Case Management Note  Patient Details  Name: Virginia Murray MRN: [id number] Date of Birth: January 05, 1941  Subjective/Objective:      Admitted to [facility] with the diagnosis of fever,. Lives with son. Just moved to   [place] 2.5 weeks ago. States she sees Dr. Tasia Catchings at the [facility]. Port in place. Will need Rocephin administered in the home. Spoke with Ms. Dykes at the bedside. States she doesn't think she can give give herself her antibiotics.               Action/Plan: Followed by [facility] for skilled Nursing and home antibiotics.    Expected Discharge Date:  09/17/18               Expected Discharge Plan:     In-House Referral:   yes  Discharge planning Services     Post Acute Care Choice:   yes Choice offered to:   patient  DME Arranged:    DME Agency:     HH Arranged:   yes HH Agency:   [facility]   Status of Service:     If discussed at [place] of Stay Meetings, dates discussed:    Additional Comments:  Shelbie Ammons, RN MSN CCM Care Management [phone] 09/17/2018, 9:20 AM

## 2018-09-17 NOTE — Discharge Planning (Addendum)
Patient port left accessed with request of [facility] teaching staff. (to allow patient for infusions at home).  RN assessment and VS revealed stability for DC to home. Patient educated on Home infusions through port. Discharge papers given, explained and educated.  Informed of suggested FU appt and patinet asked to call ID for FU appt (per office request).  Scripts printed, signed and given.  Pain med given and under control prior to DC.  Once ready, will be wheeled to front and family transporting home via car. Waiting on ride to arrive.

## 2018-09-17 NOTE — Progress Notes (Signed)
ANTICOAGULATION CONSULT NOTE - Follow UP Consult  Pharmacy Consult for Warfarin dosing  Indication: atrial fibrillation  No Known Allergies  Patient Measurements: Height: 5\' 3"  (160 cm) Weight: 119 lb 14.9 oz (54.4 kg) IBW/kg (Calculated) : 52.4  Vital Signs: Temp: 97.9 F (36.6 C) (09/18 0347) BP: 163/56 (09/18 0347) Pulse Rate: 60 (09/18 0347)  Labs: Recent Labs    09/15/18 0447 09/16/18 0413 09/17/18 0423  HGB 9.6*  --   --   HCT 28.6*  --   --   PLT 149*  --   --   LABPROT 25.6* 28.2* 26.0*  INR 2.36 2.67 2.40  CREATININE 0.70  --   --     Estimated Creatinine Clearance: 51 mL/min (by C-G formula based on SCr of 0.7 mg/dL).   Medical History: Past Medical History:  Diagnosis Date  . A-fib ([place])   . Cancer (H[facility])    vulva   . Depression   . Diverticulitis   . Hyperlipemia   . Hypertension   . Vulva cancer ([facility])     Infusions:  . sodium chloride 75 mL/hr at 09/17/18 0400  . cefTRIAXone (ROCEPHIN)  IV Stopped (09/16/18 2104)    Assessment: 77 yo female on warfarin 3mg  daily for a-fib PTA.  Pharmacy consulted to order and monitor warfarin while admitted.  Significant increase in INR on 9/15, most likely due to start of ABx.  DATE INR Dose   9/14 1.84 3 mg  9/15 2.73 hold  9/16 2.36 2mg  9/17 2.67 2mg  9/18 2.40   Goal of Therapy:  INR 2-3 Monitor platelets by anticoagulation protocol: Yes   Plan:  INR remains therapeutic. Will order patient's home dose of Warfarin 3 mg X 1 dose tonight.  INRs daily while on ABx per protocol.  Pharmacy will follow and adjust dose as per protocol.   Pernell Dupre, PharmD, BCPS Clinical Pharmacist 09/17/2018 7:21 AM

## 2018-09-19 ENCOUNTER — Inpatient Hospital Stay: Payer: Medicare Other | Admitting: Oncology

## 2018-09-19 ENCOUNTER — Telehealth: Payer: Self-pay | Admitting: *Deleted

## 2018-09-19 ENCOUNTER — Inpatient Hospital Stay: Payer: Medicare Other

## 2018-09-19 LAB — AEROBIC CULTURE W GRAM STAIN (SUPERFICIAL SPECIMEN)

## 2018-09-19 LAB — CULTURE, BLOOD (ROUTINE X 2)
CULTURE: NO GROWTH
Culture: NO GROWTH
Special Requests: ADEQUATE

## 2018-09-19 LAB — AEROBIC CULTURE  (SUPERFICIAL SPECIMEN)

## 2018-09-19 NOTE — Telephone Encounter (Signed)
Gave Virginia Murray verbal okay to send orders to [place] for Dr Tasia Catchings to sign

## 2018-09-19 NOTE — Telephone Encounter (Signed)
[facility] requests a verbal order for plan of care. Virginia Murray [phone].

## 2018-09-21 ENCOUNTER — Other Ambulatory Visit: Payer: Self-pay | Admitting: Oncology

## 2018-09-21 DIAGNOSIS — Z7901 Long term (current) use of anticoagulants: Secondary | ICD-10-CM

## 2018-09-21 LAB — CULTURE, BLOOD (SINGLE)
Culture: NO GROWTH
SPECIAL REQUESTS: ADEQUATE

## 2018-09-22 ENCOUNTER — Telehealth: Payer: Self-pay | Admitting: Oncology

## 2018-09-22 ENCOUNTER — Other Ambulatory Visit: Payer: Self-pay | Admitting: *Deleted

## 2018-09-22 NOTE — Telephone Encounter (Signed)
)     Ref Range & Units 5d ago  Prothrombin Time 11.4 - 15.2 seconds 26.[employer]

## 2018-09-22 NOTE — Telephone Encounter (Signed)
No.  I scheduled the patient an appt for a new patient visit for Friday of this week, pt called the PCP office this morning cancelled that appt and rescheduled the appt for 10/20/18.

## 2018-09-22 NOTE — Telephone Encounter (Signed)
Virginia Murray does she have PCP yet?

## 2018-09-22 NOTE — Telephone Encounter (Signed)
I prefer her to establish with PCP as soon as possible.  Will not refill Xanax. She needs a repeat INR this week for warfarin dosing.

## 2018-09-22 NOTE — Telephone Encounter (Signed)
Daughter in law Sharyn Lull called and reports that patient has " fever of 99.6". Patient reports chills, and not feeling well.  Pt was recently discharged from hospitalization of bacteremia, on IV antibiotics.  Recommend patient to go to ER for further evaluation. She may not be able to mount fever Sharyn Lull voices understanding and will bring patient to ER.

## 2018-09-22 NOTE — Telephone Encounter (Signed)
I spoke with Sharyn Lull and explained that Dr Tasia Catchings will not refill her Xanax. She stated that is fine because she called PCP and got her appointment moved back to this Friday and she is going to refill it for her. She already has an appointment Wed here for lab/ physician/ inf. And the Patient INR is already ordered

## 2018-09-22 NOTE — Telephone Encounter (Signed)
Daughter called asking if Dr Tasia Catchings will refill her Xanax that was started in the Hospital. Please advise

## 2018-09-23 ENCOUNTER — Telehealth: Payer: Self-pay | Admitting: *Deleted

## 2018-09-23 NOTE — Telephone Encounter (Signed)
Per Dr. Tasia Catchings , check INR once a week and once she establishes care with PCP, PCP will take over titrating. Also, Patient should continue taking 3 mg coumadin daily.   Spoke to [place], Therapist, sports from [employer] and notified her of Dr. Collie Siad order. She stated that patient has upcoming appointment to establish care with PCP this friday and that she would notify family about warfarin dosage.

## 2018-09-23 NOTE — Telephone Encounter (Signed)
Home Health Nurse called INR results of 1.7 and asks how often she is to check it. Please return call to [phone]

## 2018-09-24 ENCOUNTER — Encounter: Payer: Self-pay | Admitting: Oncology

## 2018-09-24 ENCOUNTER — Inpatient Hospital Stay: Payer: Medicare Other

## 2018-09-24 ENCOUNTER — Other Ambulatory Visit: Payer: Self-pay

## 2018-09-24 ENCOUNTER — Inpatient Hospital Stay (HOSPITAL_BASED_OUTPATIENT_CLINIC_OR_DEPARTMENT_OTHER): Payer: Medicare Other | Admitting: Oncology

## 2018-09-24 VITALS — BP 127/57 | HR 71

## 2018-09-24 VITALS — BP 191/75 | HR 68 | Temp 98.2°F | Resp 18 | Wt 118.5 lb

## 2018-09-24 DIAGNOSIS — I4891 Unspecified atrial fibrillation: Secondary | ICD-10-CM

## 2018-09-24 DIAGNOSIS — K5900 Constipation, unspecified: Secondary | ICD-10-CM

## 2018-09-24 DIAGNOSIS — C519 Malignant neoplasm of vulva, unspecified: Secondary | ICD-10-CM

## 2018-09-24 DIAGNOSIS — R7881 Bacteremia: Secondary | ICD-10-CM

## 2018-09-24 DIAGNOSIS — Z90710 Acquired absence of both cervix and uterus: Secondary | ICD-10-CM | POA: Diagnosis not present

## 2018-09-24 DIAGNOSIS — G893 Neoplasm related pain (acute) (chronic): Secondary | ICD-10-CM | POA: Diagnosis not present

## 2018-09-24 DIAGNOSIS — Z87891 Personal history of nicotine dependence: Secondary | ICD-10-CM

## 2018-09-24 DIAGNOSIS — I10 Essential (primary) hypertension: Secondary | ICD-10-CM

## 2018-09-24 DIAGNOSIS — M7989 Other specified soft tissue disorders: Secondary | ICD-10-CM

## 2018-09-24 DIAGNOSIS — Z923 Personal history of irradiation: Secondary | ICD-10-CM | POA: Diagnosis not present

## 2018-09-24 DIAGNOSIS — F419 Anxiety disorder, unspecified: Secondary | ICD-10-CM | POA: Diagnosis not present

## 2018-09-24 DIAGNOSIS — R451 Restlessness and agitation: Secondary | ICD-10-CM

## 2018-09-24 DIAGNOSIS — Z7901 Long term (current) use of anticoagulants: Secondary | ICD-10-CM

## 2018-09-24 DIAGNOSIS — Z79899 Other long term (current) drug therapy: Secondary | ICD-10-CM

## 2018-09-24 DIAGNOSIS — F39 Unspecified mood [affective] disorder: Secondary | ICD-10-CM

## 2018-09-24 DIAGNOSIS — E8809 Other disorders of plasma-protein metabolism, not elsewhere classified: Secondary | ICD-10-CM | POA: Diagnosis not present

## 2018-09-24 LAB — COMPREHENSIVE METABOLIC PANEL
ALT: 12 U/L (ref 0–44)
ANION GAP: 7 (ref 5–15)
AST: 20 U/L (ref 15–41)
Albumin: 2.5 g/dL — ABNORMAL LOW (ref 3.5–5.0)
Alkaline Phosphatase: 68 U/L (ref 38–126)
BUN: 12 mg/dL (ref 8–23)
CHLORIDE: 105 mmol/L (ref 98–111)
CO2: 28 mmol/L (ref 22–32)
CREATININE: 0.72 mg/dL (ref 0.44–1.00)
Calcium: 8.8 mg/dL — ABNORMAL LOW (ref 8.9–10.3)
GFR calc non Af Amer: >60 mL/min (ref >60)
Glucose, Bld: 134 mg/dL — ABNORMAL HIGH (ref 70–99)
POTASSIUM: 3.9 mmol/L (ref 3.5–5.1)
SODIUM: 140 mmol/L (ref 135–145)
Total Bilirubin: 0.6 mg/dL (ref 0.3–1.2)
Total Protein: 6.5 g/dL (ref 6.5–8.1)

## 2018-09-24 LAB — CBC WITH DIFFERENTIAL/PLATELET
BASOS PCT: 1 %
Basophils Absolute: 0.1 10*3/uL (ref 0–0.1)
EOS ABS: 0.1 10*3/uL (ref 0–0.7)
Eosinophils Relative: 1 %
HEMATOCRIT: 30.6 % — AB (ref 35.0–47.0)
HEMOGLOBIN: 10.3 g/dL — AB (ref 12.0–16.0)
LYMPHS ABS: 1.3 10*3/uL (ref 1.0–3.6)
Lymphocytes Relative: 14 %
MCH: 26.8 pg (ref 26.0–34.0)
MCHC: 33.5 g/dL (ref 32.0–36.0)
MCV: 80 fL (ref 80.0–100.0)
Monocytes Absolute: 0.6 10*3/uL (ref 0.2–0.9)
Monocytes Relative: 6 %
NEUTROS PCT: 78 %
Neutro Abs: 7.4 10*3/uL — ABNORMAL HIGH (ref 1.4–6.5)
Platelets: 235 10*3/uL (ref 150–440)
RBC: 3.83 MIL/uL (ref 3.80–5.20)
RDW: 16.2 % — ABNORMAL HIGH (ref 11.5–14.5)
WBC: 9.5 10*3/uL (ref 3.6–11.0)

## 2018-09-24 LAB — PROTIME-INR
INR: 1.32
PROTHROMBIN TIME: 16.3 s — AB (ref 11.4–15.2)

## 2018-09-24 MED ORDER — HYDRALAZINE HCL 20 MG/ML IJ SOLN: 10.0000 mg | Freq: Once | INTRAMUSCULAR | Status: DC

## 2018-09-24 MED ORDER — HYDRALAZINE HCL 20 MG/ML IJ SOLN
10.0000 mg | Freq: Once | INTRAMUSCULAR | Status: AC
  Administered 2018-09-24: 10 mg via INTRAVENOUS
  Filled 2018-09-24: qty 1

## 2018-09-24 MED ORDER — HEPARIN SOD (PORK) LOCK FLUSH 100 UNIT/ML IV SOLN
500.0000 [IU] | Freq: Once | INTRAVENOUS | Status: DC | PRN
  Filled 2018-09-24: qty 5

## 2018-09-24 MED ORDER — FAMOTIDINE IN NACL 20-0.9 MG/50ML-% IV SOLN: 20.0000 mg | Freq: Once | INTRAVENOUS | Status: DC

## 2018-09-24 MED ORDER — SODIUM CHLORIDE 0.9 % IV SOLN: 254.0000 mg | Freq: Once | INTRAVENOUS | Status: DC

## 2018-09-24 MED ORDER — SODIUM CHLORIDE 0.9 % IV SOLN
Freq: Once | INTRAVENOUS | Status: AC
  Administered 2018-09-24: 11:00:00 via INTRAVENOUS
  Filled 2018-09-24: qty 250

## 2018-09-24 MED ORDER — HEPARIN SOD (PORK) LOCK FLUSH 100 UNIT/ML IV SOLN
500.0000 [IU] | Freq: Once | INTRAVENOUS | Status: AC
  Administered 2018-09-24: 500 [IU] via INTRAVENOUS

## 2018-09-24 MED ORDER — PALONOSETRON HCL INJECTION 0.25 MG/5ML: 0.2500 mg | Freq: Once | INTRAVENOUS | Status: DC

## 2018-09-24 MED ORDER — LORAZEPAM 2 MG/ML IJ SOLN
0.5000 mg | Freq: Once | INTRAMUSCULAR | Status: AC
  Administered 2018-09-24: 0.5 mg via INTRAVENOUS
  Filled 2018-09-24: qty 1

## 2018-09-24 MED ORDER — SODIUM CHLORIDE 0.9% FLUSH
10.0000 mL | Freq: Once | INTRAVENOUS | Status: AC
  Administered 2018-09-24: 10 mL via INTRAVENOUS
  Filled 2018-09-24: qty 10

## 2018-09-24 MED ORDER — SODIUM CHLORIDE 0.9 % IV SOLN: 135.0000 mg/m2 | Freq: Once | INTRAVENOUS | Status: DC

## 2018-09-24 MED ORDER — LORAZEPAM 2 MG/ML IJ SOLN: 0.5000 mg | Freq: Once | INTRAMUSCULAR | Status: DC

## 2018-09-24 MED ORDER — SODIUM CHLORIDE 0.9 % IV SOLN
20.0000 mg | Freq: Once | INTRAVENOUS | Status: DC
  Filled 2018-09-24: qty 2

## 2018-09-24 MED ORDER — DIPHENHYDRAMINE HCL 50 MG/ML IJ SOLN: 50.0000 mg | Freq: Once | INTRAMUSCULAR | Status: DC

## 2018-09-24 NOTE — Progress Notes (Signed)
Patient here for follow up. States she is running low on xanax and blood pressure medication. She states that she has an appointment to establish PCP care on Friday but in epic it shows that appt has been cancelled. She will call to reschedule asap. Blood pressure elevated, pt states being very anxious.

## 2018-09-24 NOTE — Progress Notes (Signed)
Hematology/Oncology follow up  note [facility] [phone] [phone]   Patient Care Team: Earlie Server, MD as PCP - General (Oncology) Virginia Jacks, RN as Registered Nurse  REFERRING PROVIDER: [place] [facility].  REASON FOR VISIT Follow up for treatment of vulvar cancer  HISTORY OF PRESENTING ILLNESS:  Virginia Murray is a  77 y.o.  female with PMH listed below who was referred to me for evaluation of vulvar cancer.  Patient recently relocated from [place] to [place]. Reviewed medical records sent to [place] from [place] cancer in the blood specialist Dr. Marney Doctor, who is patient's previous oncologist. 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  She completed radiation to vulva and the right groin in August 2007 at [facility].  Since then she was on surveillance.  She was advised to have follow-up but she had to postpone as she was taking care of her husband was quite ill and unfortunately passed away On 2018/08/07, patient was found to have a 7 cm ulcerated lesion in the left vulva partially involving the vaginal and rectum on examination.  The lesion was biopsied by Dr. Tessa Murray on 07/29/2018, PET scan showed ulcerated perineal mass 8.7 x 4.2 x 5.3 centimeters, SUV 12, extending into the region of gluteal cleft.  Left inguinal lymph nodes measured [1.1 x 0.7 cm]  SUV 1.6.  Which appeared nonspecific possibly metastatic or reactive. There was no evidence of distant metastasis. Given prior radiation treatment, she was advised by Dr. Tessa Murray for chemotherapy.  She met Dr.Christi Maudie Murray for discussion of treatment options.  She was recommended to start dose reduced carboplatin and Taxol.  Also NexGen sequencing PDL 1 testing was discussed, unknown if testing were sent.   Patient did not start treatment as she is relocating to [place] to live with patient's son and wants to establish care with [facility] center.  Today patient reports moderate discomfort and pain in the vulvar area.  She also has some vaginal discharge and wear pads. Reports bleeding from her private area.  Denies any symptoms of voiding trouble, dysuria or incontinence. Reports constipation.  Past medical history includes paroxysmal atrial fibrillation and take coumadin 20m daily. She has not established care with PCP locally. No INR has been checked recently.   [place] BUN 21.3, creatinine 1.81, sodium 143, potassium 4, chloride 103, calcium 9.4, total protein 6.9, albumin 3.4, alkaline phosphatase 99 total bili 8.3, ALT 13 AST 18, iron 36, TIBC 241, iron saturation 14.9, magnesium 1.5, phosphorus 3.3, LDH 166, uric acid 5.8.  Folate 18.9, ferritin 84, B12 362, CEA 1.6. 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 The specimen biopsy only represents the superficial part of the lesion and focal invasion cannot be excluded. We have not received pathology slides to be evaluated by our pathologist. PET scan 07/29/2018 showed ulcerated perineal mass with known recurrent vulvar carcinoma.  Involving the left greater than right with posterior extension into the region of gluteal cleft approximately 8.7 x 4.2 x 5.3 cm.  SUV 12.  There is also minimally FDG avid subcentimeter left inguinal lymph nodes, nonspecific.  Metastatic versus reactive.  We also received gynecology oncology operative note in September 2014.  Patient had vulvar biopsy x3 and a perirectal biopsy x1 at that time.  Pathology results not available to [place].   #  Discussed with patient and her granddaughter who accompanied patient to today's visit that patient has disease recurrence with involvement of the vaginal and the rectum.  Discussed with GYN oncology Dr. Theora Murray.  Surgery is not an option due to her poor performance status.  Exact duration will be palliative only. We agree with the plan for chemotherapy with dose reduced carboplatin and Taxol. Avastin is not a good option for her given bowel involvement and a high risk for fistulization.  # It is unknown whether PDL 1 marker and [place] status was being sent from previous oncologist office or not. We are is doing a process of obtaining patient's pathology slides and blocks.  If it has not been sent before, we plan to send above testing.  # Most recent pathology testing showed at least squamous cell carcinoma in situ, invasive component cannot be ruled out.  This may be secondary to a suboptimal biopsy.  Given patient's poor performance and severe symptoms, I do not think a repeat biopsy is going to add any value to current management. Dr.Secord aware about pathology and also agrees that proceeding with chemotherapy without re-biopsy is reasonable.  Patient also agreed to proceed with chemotherapy treatments based on pathology diagnosis from outside pathologist. The diagnosis and care plan were discussed with patient in detail.    INTERVAL HISTORY [name] is a 77 y.o. female who has above history reviewed by me today presents for follow up visit for management of recurrent Vulvur cancer and bateremia and cancer symptom management.   # Recently hospitalization due to Group G Strep Bacteremia. Currently on home IV Ceftriaxone, need to finish 2 weeks course.  Daughter in law called this Monday and reports " patient not feeling well", temperature was 99.6. Also has chills. I recommended her to go to ER for evaluation.  Patient did not go.  Today reports feeling well.   # Neoplasm related pain:  reports pain is better controlled with current pain regimen. [fetanyl patch and oxycodone 147m Q4-6h PRN].  # Constipation: manageable, takes Senna daily.  # Chronic anticoagulation on Coumadin. She supposed to take 3647mdaily.  Patient reports that "I was told to take 47m69mnly" she does not remember which family member gave her this information.    # Blood pressure: last visit, her blood pressure was low so chemotherapy was held. I suggests her to hold her BP medication except atenolol.  Today patient's blood pressure was uncontrolled, 191/75. Repeat measurement systolic blood pressure [id number] denies any headache, slurred speech, focal weakness.   # Leg swelling: new problem for her. Bilateral lower extremities, no aggravating or alleviated symptoms.  No calf tenderness.   Review of Systems  Constitutional: Positive for chills and malaise/fatigue. Negative for fever and weight loss.  HENT: Negative for nosebleeds and sore throat.   Eyes: Negative for double vision, photophobia and redness.  Respiratory: Negative for cough, shortness of breath and wheezing.   Cardiovascular: Negative for chest pain, palpitations and orthopnea.  Gastrointestinal: Negative for abdominal pain, blood in stool, constipation, nausea and vomiting.  Genitourinary: Negative for dysuria.       Soreness/pain around Vulva mass, spotting blood.  Musculoskeletal: Negative for back pain, myalgias and neck pain.  Skin: Negative for itching and rash.  Neurological: Negative for dizziness,  tingling and tremors.  Endo/Heme/Allergies: Negative for environmental allergies. Does not bruise/bleed easily.  Psychiatric/Behavioral: Negative for depression. The patient is nervous/anxious.     MEDICAL HISTORY:  Past Medical History:  Diagnosis Date  . A-fib ([place])   . Cancer (HCC)    vulva   . Depression   . Diverticulitis   . Hyperlipemia   . Hypertension   . Vulva cancer [facility])     SURGICAL HISTORY: Past Surgical  History:  Procedure Laterality Date  . ABDOMINAL HYSTERECTOMY    . CATARACT EXTRACTION, BILATERAL    . KNEE SURGERY     right knee   . PORTA CATH INSERTION N/A 09/04/2018   Procedure: PORTA CATH INSERTION;  Surgeon: Algernon Huxley, MD;  Location: [place] CV LAB;  Service: Cardiovascular;  Laterality: N/A;  . TEE WITHOUT CARDIOVERSION N/A 09/16/2018   Procedure: TRANSESOPHAGEAL ECHOCARDIOGRAM (TEE);  Surgeon: Nelva Bush, MD;  Location: [facility] ORS;  Service: Cardiovascular;  Laterality: N/A;  . TONSILLECTOMY    . VULVA SURGERY      SOCIAL HISTORY: Social History   Socioeconomic History  . Marital status: Widowed    Spouse name: Not on file  . Number of children: 2  . Years of education: Not on file  . Highest education level: Not on file  Occupational History  . Not on file  Social Needs  . Financial resource strain: Not on file  . Food insecurity:    Worry: Not on file    Inability: Not on file  . Transportation needs:    Medical: Not on file    Non-medical: Not on file  Tobacco Use  . Smoking status: Former Smoker    Packs/day: 1.00    Years: 20.00    Pack years: 20.00    Types: Cigarettes    Last attempt to quit: 08/29/1993    Years since quitting: 25.0  . Smokeless tobacco: Never Used  Substance and Sexual Activity  . Alcohol use: Not Currently  . Drug use: Never  . Sexual activity: Not Currently  Lifestyle  . Physical activity:    Days per week: Not on file    Minutes per session: Not on file  . Stress: Not on file  Relationships  . Social connections:    Talks on phone: Not on file    Gets together: Not on file    Attends religious service: Not on file    Active member of club or organization: Not on file    Attends meetings of clubs or organizations: Not on file    Relationship status: Not on file  . Intimate partner violence:    Fear of current or ex partner: Not on file    Emotionally abused: Not on file    Physically abused: Not on file     Forced sexual activity: Not on file  Other Topics Concern  . Not on file  Social History Narrative  . Not on file    FAMILY HISTORY: Family History  Problem Relation Age of Onset  . Alzheimer's disease Mother   . Heart disease Father     ALLERGIES:  has No Known Allergies.  MEDICATIONS:  Current Outpatient Medications  Medication Sig Dispense Refill  . ALPRAZolam (XANAX) 0.25 MG tablet Take 1 tablet (0.25 mg total) by mouth 2 (two) times daily as needed for anxiety. 14 tablet 0  . amLODipine (NORVASC) 2.5 MG tablet Take 2.5 mg by mouth daily.    . cefTRIAXone (ROCEPHIN) IVPB Inject 2  g into the vein daily for 10 days. Indication:  Group G strep bacteremia Last Day of Therapy:  09/28/2018 Labs weekly while on IV antibiotics: _X_ CBC with differential _X_ BMP 10 Units 0  . dexamethasone (DECADRON) 4 MG tablet Take 2 tablets (8 mg total) by mouth daily. Start the day after chemotherapy for 2 days. 30 tablet 1  . fentaNYL (DURAGESIC - DOSED MCG/HR) 25 MCG/HR patch Place 1 patch (25 mcg total) onto the skin every 3 (three) days. 5 patch 0  . lidocaine-prilocaine (EMLA) cream Apply to affected area once 30 g 3  . ondansetron (ZOFRAN) 8 MG tablet Take 1 tablet (8 mg total) by mouth 2 (two) times daily as needed for refractory nausea / vomiting. Start on day 3 after chemo. 30 tablet 1  . oxyCODONE (ROXICODONE) 5 MG immediate release tablet Take 1 tablet (5 mg total) by mouth every 4 (four) hours as needed for moderate pain or severe pain. 30 tablet 0  . polyethylene glycol (MIRALAX) packet Take 17 g by mouth daily as needed for mild constipation or moderate constipation. 30 each 0  . pravastatin (PRAVACHOL) 40 MG tablet Take 40 mg by mouth daily.    . prochlorperazine (COMPAZINE) 10 MG tablet Take 1 tablet (10 mg total) by mouth every 6 (six) hours as needed (Nausea or vomiting). 30 tablet 1  . senna (SENOKOT) 8.6 MG TABS tablet Take 2 tablets (17.2 mg total) by mouth daily. 120 each 0  .  vitamin B-12 1000 MCG tablet Take 1 tablet (1,000 mcg total) by mouth daily. 120 tablet 0  . warfarin (COUMADIN) 1 MG tablet Take 3 tablets (3 mg total) by mouth daily. 60 tablet 0  . atenolol (TENORMIN) 100 MG tablet Take 100 mg by mouth daily.    . benazepril (LOTENSIN) 40 MG tablet Take 40 mg by mouth daily.    Marland Kitchen doxazosin (CARDURA) 8 MG tablet Take 8 mg by mouth daily.     No current facility-administered medications for this visit.      PHYSICAL EXAMINATION: ECOG PERFORMANCE STATUS: 2 - Symptomatic, <50% confined to bed Vitals:   09/24/18 0930  BP: (!) 191/75  Pulse: 68  Resp: 18  Temp: 98.2 F (36.8 C)   Filed Weights   09/24/18 0930  Weight: 118 lb 8 oz (53.8 kg)    Physical Exam  Constitutional: She is oriented to person, place, and time. No distress.  Frail appearance elderly female, mild distress sitting on pillow  HENT:  Head: Normocephalic and atraumatic.  Mouth/Throat: Oropharynx is clear and moist.  Eyes: Pupils are equal, round, and reactive to light. EOM are normal. No scleral icterus.  Neck: Normal range of motion. Neck supple.  Cardiovascular: Normal rate, regular rhythm and normal heart sounds.  Pulmonary/Chest: Effort normal. No respiratory distress. She has no wheezes.  Abdominal: Soft. Bowel sounds are normal. She exhibits no distension and no mass. There is no tenderness.  Genitourinary: Vaginal discharge found.  Genitourinary Comments: Bloody vaginal discharge. Left vulvar ulcerated mass extending to the angina, foul-smelling odor  Musculoskeletal: Normal range of motion. She exhibits no edema or deformity.  Neurological: She is alert and oriented to person, place, and time. No cranial nerve deficit. Coordination normal.  Skin: Skin is warm and dry. No rash noted. No erythema.  Psychiatric:  Easily agitated, anxious, interrupts conversations,      LABORATORY DATA:  I have reviewed the data as listed Lab Results  Component Value Date   WBC 9.5  09/24/2018   HGB 10.3 (L) 09/24/2018   HCT 30.6 (L) 09/24/2018   MCV 80.0 09/24/2018   PLT 235 09/24/2018   Recent Labs    09/12/18 0830 09/13/18 0858 09/14/18 0528 09/15/18 0447 09/24/18 0857  NA 140 136 139 136 140  K 4.5 4.2 3.8 3.6 3.9  CL 104 105 111 111 105  CO2 _0 20* 28  GLUCOSE 182* 121* 95 83 134*  BUN 33* 31* _1 CREATININE 1.13* 0.94 0.86 0.70 0.72  CALCIUM 9.1 8.6* 7.3* 7.3* 8.8*  GFRNONAA 47* 58* >60 >60 >60  GFRAA 54* >60 >60 >60 >60  PROT 6.8 6.2*  --   --  6.5  ALBUMIN 2.9* 2.7*  --   --  2.5*  AST 20 15  --   --  20  ALT 10 10  --   --  12  ALKPHOS 72 67  --   --  68  BILITOT 0.8 0.7  --   --  0.6   Iron/TIBC/Ferritin/ %Sat    Component Value Date/Time   IRON 19 (L) 09/13/2018 1725   TIBC 196 (L) 09/13/2018 1725   FERRITIN 66 09/13/2018 1725   IRONPCTSAT 10 (L) 09/13/2018 1725        ASSESSMENT & PLAN:  1. Vulvar cancer (HCC)   2. Chronic anticoagulation   3. Neoplasm related pain   4. Atrial fibrillation, unspecified type ([place])   5. Anxiety   6. Uncontrolled hypertension   7. Bacteremia    # Vulvar cancer, plan palliative chemotherapy with Carboplatin and Taxol [dose reduced].  I had a lengthy discussion with patient and son that, since she had been on IV antibiotics for about one week, it is probably ok to start chemotherapy now given that she is so symptomatic from the cancer. I have discussed with ID physician Dr.Ravishankar and she is ok with starting chemotherapy after finishes 1 week of IV antibiotics.   Chemotherapy may decrease her immunity and potentially cause her infection worse.  Patient and her son do not want to take extra risk and prefer to wait until she finishes 2 weeks course of antibiotics.   #Neoplasm related pain,Continue Oxycodone 68m every 4-6 hours as needed. Fentanyl patch 240m/h Q72h.   Discussed the potential side effects and treatment/ prevention  of constipation,  respiratory depression,  and mental  status changes.  # Uncontrolled hypertension: suggests patient to continue atenolol, resume benazepril and norvasc. Continue hold doxazosin.  Patient's agitation also contributes to her uncontrolled HTN. I went over every medication on her list and given her instructions of how she takes these medication.  Will give patient 1062mf hydralazine IV. Her blood pressure improved.   # Agitation/anxiety/underlying mood disorder: patient was agitated, not respectful,  keep interrupting our conversation. She was started on Xanax during her recent hospitalization. She demands me refilling Xanax.  I advise patient to establish care with PCP and may be she also need to be further evaluated by psychiatrist. Will give her one dose of Ativan 0.5mg23m x 1 during visit.   #Atrial fibrillation on chronic anticoagulation with Coumadin. She was confused about her coumadin dose and has been taking only 1mg 27mly.  She blames "discharge paper work instruction was not clear" "They told me to take 1mg".61m reviewed discharge paper work with her and coumadin dose was 3mg da25m.  INR has been subtherapeutic. I recommend patient to resume to 3mg dai70m She will establish care with  PCP who will take over her coumadin dosing according to INR.   # Bacteremia: finish course of IV antibiotics.  Will draw blood culture through her medi port 1 weeks after she finishes antibiotics.   We spent sufficient time to discuss many aspect of care, questions were answered to patient's satisfaction.   All questions were answered. The patient knows to call the clinic with any problems questions or concerns.  Return of visit: 1 week for assessment prior to cycle 1 chemotherapy. Total face to face encounter time for this patient visit was 65mn. >50% of the time was  spent in counseling and coordination of care.   ZEarlie Server MD, PhD Hematology Oncology [facility]oscopy CenterPager- [phone]/25/2019

## 2018-09-25 ENCOUNTER — Telehealth: Payer: Self-pay

## 2018-09-25 NOTE — Telephone Encounter (Signed)
EMMI Follow-up: Noted on the report that the patient wasn't sure if she had received her discharge papers.  I called the number listed on the report which was for [place] was full. Will try again.

## 2018-09-26 ENCOUNTER — Ambulatory Visit (INDEPENDENT_AMBULATORY_CARE_PROVIDER_SITE_OTHER): Payer: Medicare Other

## 2018-09-26 ENCOUNTER — Encounter: Payer: Self-pay | Admitting: Oncology

## 2018-09-26 ENCOUNTER — Ambulatory Visit (INDEPENDENT_AMBULATORY_CARE_PROVIDER_SITE_OTHER): Payer: Medicare Other | Admitting: Primary Care

## 2018-09-26 ENCOUNTER — Encounter: Payer: Self-pay | Admitting: Primary Care

## 2018-09-26 ENCOUNTER — Other Ambulatory Visit: Payer: Self-pay | Admitting: Oncology

## 2018-09-26 ENCOUNTER — Ambulatory Visit: Payer: Medicare Other | Admitting: Primary Care

## 2018-09-26 VITALS — BP 166/64 | HR 68 | Temp 98.2°F | Ht 62.0 in | Wt 115.0 lb

## 2018-09-26 DIAGNOSIS — F4321 Adjustment disorder with depressed mood: Secondary | ICD-10-CM | POA: Diagnosis not present

## 2018-09-26 DIAGNOSIS — Z7901 Long term (current) use of anticoagulants: Secondary | ICD-10-CM

## 2018-09-26 DIAGNOSIS — F411 Generalized anxiety disorder: Secondary | ICD-10-CM | POA: Diagnosis not present

## 2018-09-26 DIAGNOSIS — T402X5A Adverse effect of other opioids, initial encounter: Secondary | ICD-10-CM

## 2018-09-26 DIAGNOSIS — K5903 Drug induced constipation: Secondary | ICD-10-CM

## 2018-09-26 DIAGNOSIS — E785 Hyperlipidemia, unspecified: Secondary | ICD-10-CM | POA: Diagnosis not present

## 2018-09-26 DIAGNOSIS — I480 Paroxysmal atrial fibrillation: Secondary | ICD-10-CM | POA: Diagnosis not present

## 2018-09-26 DIAGNOSIS — C519 Malignant neoplasm of vulva, unspecified: Secondary | ICD-10-CM

## 2018-09-26 DIAGNOSIS — I4891 Unspecified atrial fibrillation: Secondary | ICD-10-CM | POA: Insufficient documentation

## 2018-09-26 DIAGNOSIS — R451 Restlessness and agitation: Secondary | ICD-10-CM

## 2018-09-26 LAB — POCT INR: INR: 1.8 — AB (ref 2.0–3.0)

## 2018-09-26 MED ORDER — FENTANYL 25 MCG/HR TD PT72: 25.0000 ug | MEDICATED_PATCH | TRANSDERMAL | 0 refills | Status: DC

## 2018-09-26 MED ORDER — SERTRALINE HCL 25 MG PO TABS: 25.0000 mg | ORAL_TABLET | Freq: Every day | ORAL | 1 refills | Status: AC

## 2018-09-26 MED ORDER — OXYCODONE HCL 5 MG PO TABS: 5.0000 mg | ORAL_TABLET | ORAL | 0 refills | Status: DC | PRN

## 2018-09-26 NOTE — Patient Instructions (Signed)
INR today 1.8  Per family and patient she is taking 1 pill (3mg ) daily as instructed by the oncologist this week.  She has recently increased the dose from 1mg  daily (in hospital) which contributed to low reading this week.  As oncologist has already given her the boost, we will continue with taking 1 pill (3mg ) daily and recheck again in 1 week.  Patient here with grandaughter today who will help her set up a pill box for all medications.  Otherwise patient is doing well.

## 2018-09-26 NOTE — Assessment & Plan Note (Signed)
Established today in our Coumadin Clinic. Repeat INR next week.

## 2018-09-26 NOTE — Progress Notes (Signed)
Subjective:    Patient ID: Virginia Murray, female    DOB: 14-Nov-1941, 77 y.o.   MRN: [id number]  HPI  Virginia Murray is a 77 year old female who presents today to establish care and discuss the problems mentioned below. Will obtain old records. She recently moved to [place] from [place].   1) Essential Hypertension: Currently managed on benazepril 40 mg, atenolol 100 mg daily, doxazosin 8 mg daily, amlodipine 2.5 mg daily. Her medications were help except for atenolol 100 mg during her hospital stay due to episodes of hypotension. Her mediations were recently re-initiated during her visit with oncology two days ago with the exception of doxazosin.   She does not check her BP at home.   BP Readings from Last 3 Encounters:  09/26/18 (!) 166/64  09/24/18 (!) 127/57  09/24/18 (!) 191/75   2) Vulvar Cancer: Diagnosed with metastatic vulvar cancer in 2007. Underwent radical vulvectomy and bilateral inguinal lymphadenectomy at that time. She had little follow up since diagnosis and treatment in 2007 until July of 2019 when she was found to have 7 cm ulcerated lesion to the left vulva with involvement to the vagina and rectum. PET scan with potential metastasis to left inguinal lymph nodes.   She is not a candidate for surgical intervention. She is to start chemotherapy with carboplatin and Taxol. She was recently admitted to the hospital and treated for group G strep bacteremia. She is on IV ceftriaxone at home, also had a hospital follow up visit with her oncologist.   She is managed on fentanyl and oxycodone for chronic pain. She is taking senna for opiod induced constipation. She is due to start chemotherapy next week. She is compliant to her ceftriaxone and has three days remaining.   3) Atrial Fibrillation: Currently managed on Coumadin 3 mg which was recently increased by her oncologist. She is managed by her oncologist now, but will need to establish with our Coumadin clinic.    4) Anxiety/Aggitation/Grief: Recently lost her husband in May this year from cancer, also re-diagnosed with cancer herself in July 2019. She's been strugging with anxiety and depression for over one year but has noticed increased symptoms over the last several months. Symptoms include agitation, tearfulness, feeling depressed, anxiety, nervousness. She was started on Xanax in the hospital which helped, she is requesting refills today.  GAD 7 score 18 and PHQ 9 score 16 today. She denies SI/HI.   5) Hyperlipidemia: Currently managed on pravastatin 40 mg. She doesn't remember when she last completed a lipid panel.   Review of Systems  Constitutional: Positive for appetite change.  Respiratory: Negative for shortness of breath.   Cardiovascular: Negative for chest pain and palpitations.  Gastrointestinal:       Intermittent nausea  Genitourinary:       Vulvar cancer  Musculoskeletal: Positive for arthralgias.  Skin: Negative for color change.  Neurological: Negative for headaches.  Hematological: Negative for adenopathy.  Psychiatric/Behavioral:       See HPI       Past Medical History:  Diagnosis Date  . A-fib ([place])   . Cancer (HCC)    vulva   . Depression   . Diverticulitis   . Hyperlipemia   . Hypertension   . Vulva cancer [facility])      Social History   Socioeconomic History  . Marital status: Widowed    Spouse name: Not on file  . Number of children: 2  . Years of education: Not on  file  . Highest education level: Not on file  Occupational History  . Not on file  Social Needs  . Financial resource strain: Not on file  . Food insecurity:    Worry: Not on file    Inability: Not on file  . Transportation needs:    Medical: Not on file    Non-medical: Not on file  Tobacco Use  . Smoking status: Former Smoker    Packs/day: 1.00    Years: 20.00    Pack years: 20.00    Types: Cigarettes    Last attempt to quit: 08/29/1993    Years since quitting: 25.0  .  Smokeless tobacco: Never Used  Substance and Sexual Activity  . Alcohol use: Not Currently  . Drug use: Never  . Sexual activity: Not Currently  Lifestyle  . Physical activity:    Days per week: Not on file    Minutes per session: Not on file  . Stress: Not on file  Relationships  . Social connections:    Talks on phone: Not on file    Gets together: Not on file    Attends religious service: Not on file    Active member of club or organization: Not on file    Attends meetings of clubs or organizations: Not on file    Relationship status: Not on file  . Intimate partner violence:    Fear of current or ex partner: Not on file    Emotionally abused: Not on file    Physically abused: Not on file    Forced sexual activity: Not on file  Other Topics Concern  . Not on file  Social History Narrative  . Not on file    Past Surgical History:  Procedure Laterality Date  . ABDOMINAL HYSTERECTOMY    . CATARACT EXTRACTION, BILATERAL    . KNEE SURGERY     right knee   . PORTA CATH INSERTION N/A 09/04/2018   Procedure: PORTA CATH INSERTION;  Surgeon: Algernon Huxley, MD;  Location: [place] [facility];  Service: Cardiovascular;  Laterality: N/A;  . TEE WITHOUT CARDIOVERSION N/A 09/16/2018   Procedure: TRANSESOPHAGEAL ECHOCARDIOGRAM (TEE);  Surgeon: Nelva Bush, MD;  Location: [facility] ORS;  Service: Cardiovascular;  Laterality: N/A;  . TONSILLECTOMY    . VULVA SURGERY      Family History  Problem Relation Age of Onset  . Alzheimer's disease Mother   . Heart disease Father     No Known Allergies  Current Outpatient Medications on File Prior to Visit  Medication Sig Dispense Refill  . amLODipine (NORVASC) 2.5 MG tablet Take 2.5 mg by mouth daily.    Marland Kitchen atenolol (TENORMIN) 100 MG tablet Take 100 mg by mouth daily.    . benazepril (LOTENSIN) 40 MG tablet Take 40 mg by mouth daily.    Marland Kitchen dexamethasone (DECADRON) 4 MG tablet Take 2 tablets (8 mg total) by mouth daily. Start the day after  chemotherapy for 2 days. 30 tablet 1  . lidocaine-prilocaine (EMLA) cream Apply to affected area once 30 g 3  . polyethylene glycol (MIRALAX) packet Take 17 g by mouth daily as needed for mild constipation or moderate constipation. 30 each 0  . pravastatin (PRAVACHOL) 40 MG tablet Take 40 mg by mouth daily.    . prochlorperazine (COMPAZINE) 10 MG tablet Take 1 tablet (10 mg total) by mouth every 6 (six) hours as needed (Nausea or vomiting). 30 tablet 1  . senna (SENOKOT) 8.6 MG TABS tablet Take 2  tablets (17.2 mg total) by mouth daily. 120 each 0  . vitamin B-12 1000 MCG tablet Take 1 tablet (1,000 mcg total) by mouth daily. 120 tablet 0  . warfarin (COUMADIN) 1 MG tablet Take 3 tablets (3 mg total) by mouth daily. 60 tablet 0  . cefTRIAXone (ROCEPHIN) IVPB Inject 2 g into the vein daily for 10 days. Indication:  Group G strep bacteremia Last Day of Therapy:  09/28/2018 Labs weekly while on IV antibiotics: _X_ CBC with differential _X_ BMP (Patient not taking: Reported on 09/26/2018) 10 Units 0  . doxazosin (CARDURA) 8 MG tablet Take 8 mg by mouth daily.    . ondansetron (ZOFRAN) 8 MG tablet Take 1 tablet (8 mg total) by mouth 2 (two) times daily as needed for refractory nausea / vomiting. Start on day 3 after chemo. (Patient not taking: Reported on 09/26/2018) 30 tablet 1   No current facility-administered medications on file prior to visit.     BP (!) 166/64   Pulse 68   Temp 98.2 F (36.8 C) (Oral)   Ht 5\' 2"  (1.575 m)   Wt 115 lb (52.2 kg)   SpO2 92%   BMI 21.03 kg/m    Objective:   Physical Exam  Constitutional: She is oriented to person, place, and time. She appears cachectic.  HENT:  Mouth/Throat: Oropharynx is clear and moist.  Neck: Neck supple.  Cardiovascular: Normal rate and regular rhythm.  Respiratory: Effort normal and breath sounds normal.  Neurological: She is alert and oriented to person, place, and time.  Skin: Skin is warm and dry.  Psychiatric: She has a  normal mood and affect.           Assessment & Plan:

## 2018-09-26 NOTE — Progress Notes (Signed)
Patient called cancer center requesting refill of Fentanyl 25 mcg q 72 hours and Oxycodone 5 mg q 4 hours PRN.   [place] Controlled Substance Reporting System reviewed and refill is appropriate on or after 09/17/18 for Oxycodone and 10/01/18 for fentanyl patch. Will back date fentanyl patch. Medication e-scribed to her pharmacy ([facility]) using [employer] 2-step verification process.    [place] reviewed:     Faythe Casa, NP 09/26/2018 10:37 AM [phone]

## 2018-09-26 NOTE — Assessment & Plan Note (Signed)
Rate and rhythm regular today.  Continue beta blocker and warfarin. INR today of 1.8 on new dose of 3 mg. Will get her established with our clinic and plan to see her back for repeat INR next week.

## 2018-09-26 NOTE — Assessment & Plan Note (Signed)
Secondary to the combination of grief from her late husband, moving from [place], and cancer recurrence. Treat with SSRI daily as benzos are not best practice for daily anxiety symptoms. Follow up in 6 weeks.

## 2018-09-26 NOTE — Assessment & Plan Note (Signed)
Managed on senna with improvement. Continue same.

## 2018-09-26 NOTE — Assessment & Plan Note (Signed)
History of over the last year, also increased symptoms since the loss of her husband and her diagnosis of cancer.  Discussed that benzos like Xanax are not best practice for daily anxiety symptoms, she verbalized understanding.  Rx for Zoloft 25 mg sent to pharmacy. Patient is to take 1/2 tablet daily for 6 days, then advance to 1 full tablet thereafter. We discussed possible side effects of headache, GI upset, drowsiness, and SI/HI. If thoughts of SI/HI develop, we discussed to present to the emergency immediately. Patient verbalized understanding.   Follow up in 6 weeks for re-evaluation.

## 2018-09-26 NOTE — Assessment & Plan Note (Signed)
Following with oncology. Originally diagnosed in 2007 with recurrence in 2019. Will undergo chemotherapy with first treatment next week. Pain management per oncology, patient verbalized understanding.

## 2018-09-26 NOTE — Assessment & Plan Note (Signed)
Compliant to pravastatin. Repeat lipid panel pending. 

## 2018-09-26 NOTE — Patient Instructions (Addendum)
Start monitoring your blood pressure daily, around the same time of day.  Ensure that you have rested for 30 minutes prior to checking your blood pressure and use the same arm. Record your readings and notify me if your blood pressure runs consistently at or above 150/90.  Start sertraline 25 mg (Zoloft) for anxiety and depression. Start by taking 1/2 tablet daily for 6 days, then increase to 1 full tablet thereafter.  Follow up with your oncologist for refills of fentanyl and oxycodone.   Follow up with [name], our coumadin nurse for management of coumadin.   Notify the oncology lab department that I need a cholesterol lab test. It's already ordered in the system.   Schedule a follow up visit in 6 weeks for re-evaluation of anxiety.  It was a pleasure to meet you today! Please don't hesitate to call or message me with any questions. Welcome to [facility]!

## 2018-09-29 ENCOUNTER — Emergency Department
Admission: EM | Admit: 2018-09-29 | Discharge: 2018-09-30 | Disposition: A | Discharge status: Home / Self Care | Payer: Medicare Other | Attending: Emergency Medicine | Admitting: Emergency Medicine

## 2018-09-29 ENCOUNTER — Emergency Department: Payer: Medicare Other

## 2018-09-29 ENCOUNTER — Other Ambulatory Visit: Payer: Self-pay

## 2018-09-29 DIAGNOSIS — R55 Syncope and collapse: Secondary | ICD-10-CM | POA: Insufficient documentation

## 2018-09-29 DIAGNOSIS — Z87891 Personal history of nicotine dependence: Secondary | ICD-10-CM | POA: Diagnosis not present

## 2018-09-29 DIAGNOSIS — Z79899 Other long term (current) drug therapy: Secondary | ICD-10-CM | POA: Insufficient documentation

## 2018-09-29 DIAGNOSIS — Z7901 Long term (current) use of anticoagulants: Secondary | ICD-10-CM | POA: Insufficient documentation

## 2018-09-29 LAB — CBC
HCT: 30.6 % — ABNORMAL LOW (ref 35.0–47.0)
Hemoglobin: 10.4 g/dL — ABNORMAL LOW (ref 12.0–16.0)
MCH: 27.2 pg (ref 26.0–34.0)
MCHC: 33.9 g/dL (ref 32.0–36.0)
MCV: 80.2 fL (ref 80.0–100.0)
PLATELETS: 198 10*3/uL (ref 150–440)
RBC: 3.82 MIL/uL (ref 3.80–5.20)
RDW: 16.7 % — ABNORMAL HIGH (ref 11.5–14.5)
WBC: 7.7 10*3/uL (ref 3.6–11.0)

## 2018-09-29 LAB — TROPONIN I
Troponin I: <0.03 ng/mL (ref <0.03)
Troponin I: <0.03 ng/mL (ref <0.03)

## 2018-09-29 LAB — BASIC METABOLIC PANEL
Anion gap: 7 (ref 5–15)
BUN: 12 mg/dL (ref 8–23)
CALCIUM: 8.6 mg/dL — AB (ref 8.9–10.3)
CO2: 27 mmol/L (ref 22–32)
CREATININE: 0.74 mg/dL (ref 0.44–1.00)
Chloride: 103 mmol/L (ref 98–111)
GFR calc non Af Amer: >60 mL/min (ref >60)
GLUCOSE: 157 mg/dL — AB (ref 70–99)
Potassium: 3.3 mmol/L — ABNORMAL LOW (ref 3.5–5.1)
Sodium: 137 mmol/L (ref 135–145)

## 2018-09-29 LAB — PROTIME-INR
INR: 2.03
PROTHROMBIN TIME: 22.8 s — AB (ref 11.4–15.2)

## 2018-09-29 MED ORDER — SODIUM CHLORIDE 0.9 % IV BOLUS
1000.0000 mL | Freq: Once | INTRAVENOUS | Status: AC
  Administered 2018-09-29: 1000 mL via INTRAVENOUS

## 2018-09-29 NOTE — ED Notes (Signed)
Assisted patient to the bedside toilet.

## 2018-09-29 NOTE — ED Triage Notes (Signed)
Pt arrives to ED via [facility] from home with c/o syncopal episode with approximately 30 sec LOC. Pt stated to Dr Clearnce Hasten feeling dizzy and weak prior to syncope. Pt c/o anterior and posterior head pain, no bleeding or hematoma noted.

## 2018-09-29 NOTE — ED Provider Notes (Signed)
[facility] Emergency Department Provider Note ____________________________________________   First MD Initiated Contact with Patient 09/29/18 2034     (approximate)  I have reviewed the triage vital signs and the nursing notes.   HISTORY  Chief Complaint Loss of Consciousness   HPI Virginia Murray is a 77 y.o. female with a history of atrial fibrillation on Coumadin who was presented to the emergency department today with a syncopal episode.  She says that she was walking and felt dizzy and that is the last that she remembers.  She says that her memory otherwise is "hazy" regarding the incident.  EMS reports that she had a blood glucose in the 160s and the family may have seen some posturing but no overt seizure activity.  Says that she was unconscious for about 30 seconds.  EMS reports patient fell backward and hit the back of her head.  Patient at this time reports a generalized headache which is mild to moderate.  Denies any neck pain.  Denies any chest pain.  EMS reports that the patient has been unable to start her chemotherapeutic treatment because of an elevated INR.   Past Medical History:  Diagnosis Date  . A-fib ([place])   . Cancer (HCC)    vulva   . Depression   . Diverticulitis   . Hyperlipemia   . Hypertension   . Vulva cancer [facility])     Patient Active Problem List   Diagnosis Date Noted  . Atrial fibrillation ([place]) 09/26/2018  . Long term (current) use of anticoagulants 09/26/2018  . Hyperlipidemia 09/26/2018  . Constipation due to opioid therapy 09/26/2018  . GAD (generalized anxiety disorder)   . Agitation   . Vulvar cancer ([place]) 09/03/2018  . Goals of care, counseling/discussion 09/03/2018    Past Surgical History:  Procedure Laterality Date  . ABDOMINAL HYSTERECTOMY    . CATARACT EXTRACTION, BILATERAL    . KNEE SURGERY     right knee   . PORTA CATH INSERTION N/A 09/04/2018   Procedure: PORTA CATH INSERTION;  Surgeon: Algernon Huxley, MD;  Location: [place] CV LAB;  Service: Cardiovascular;  Laterality: N/A;  . TEE WITHOUT CARDIOVERSION N/A 09/16/2018   Procedure: TRANSESOPHAGEAL ECHOCARDIOGRAM (TEE);  Surgeon: Nelva Bush, MD;  Location: [facility] ORS;  Service: Cardiovascular;  Laterality: N/A;  . TONSILLECTOMY    . VULVA SURGERY      Prior to Admission medications   Medication Sig Start Date End Date Taking? Authorizing Provider  amLODipine (NORVASC) 2.5 MG tablet Take 2.5 mg by mouth daily.    [provider]  atenolol (TENORMIN) 100 MG tablet Take 100 mg by mouth daily.    [provider]  benazepril (LOTENSIN) 40 MG tablet Take 40 mg by mouth daily.    [provider]  dexamethasone (DECADRON) 4 MG tablet Take 2 tablets (8 mg total) by mouth daily. Start the day after chemotherapy for 2 days. 09/03/18   Earlie Server, MD  doxazosin (CARDURA) 8 MG tablet Take 8 mg by mouth daily.    [provider]  fentaNYL (DURAGESIC - DOSED MCG/HR) 25 MCG/HR patch Place 1 patch (25 mcg total) onto the skin every 3 (three) days. 09/30/18   Earlie Server, MD  lidocaine-prilocaine (EMLA) cream Apply to affected area once 09/03/18   Earlie Server, MD  ondansetron (ZOFRAN) 8 MG tablet Take 1 tablet (8 mg total) by mouth 2 (two) times daily as needed for refractory nausea / vomiting. Start on day 3  after chemo. Patient not taking: Reported on 09/26/2018 09/03/18   Earlie Server, MD  oxyCODONE (ROXICODONE) 5 MG immediate release tablet Take 1 tablet (5 mg total) by mouth every 4 (four) hours as needed for moderate pain or severe pain. 09/26/18   Jacquelin Hawking, NP  polyethylene glycol [facility]) packet Take 17 g by mouth daily as needed for mild constipation or moderate constipation. 08/29/18   Earlie Server, MD  pravastatin (PRAVACHOL) 40 MG tablet Take 40 mg by mouth daily.    [provider]  prochlorperazine (COMPAZINE) 10 MG tablet Take 1 tablet (10 mg total) by mouth every 6 (six) hours as needed (Nausea or  vomiting). 09/03/18   Earlie Server, MD  senna (SENOKOT) 8.6 MG TABS tablet Take 2 tablets (17.2 mg total) by mouth daily. 08/29/18   Earlie Server, MD  sertraline (ZOLOFT) 25 MG tablet Take 1 tablet (25 mg total) by mouth daily. 09/26/18   Pleas Koch, NP  vitamin B-12 1000 MCG tablet Take 1 tablet (1,000 mcg total) by mouth daily. 09/17/18   Bettey Costa, MD  warfarin (COUMADIN) 1 MG tablet Take 3 tablets (3 mg total) by mouth daily. 09/22/18   Earlie Server, MD    Allergies Patient has no known allergies.  Family History  Problem Relation Age of Onset  . Alzheimer's disease Mother   . Heart disease Father     Social History Social History   Tobacco Use  . Smoking status: Former Smoker    Packs/day: 1.00    Years: 20.00    Pack years: 20.00    Types: Cigarettes    Last attempt to quit: 08/29/1993    Years since quitting: 25.1  . Smokeless tobacco: Never Used  Substance Use Topics  . Alcohol use: Not Currently  . Drug use: Never    Review of Systems  Constitutional: No fever/chills Eyes: No visual changes. ENT: No sore throat. Cardiovascular: Denies chest pain. Respiratory: Denies shortness of breath. Gastrointestinal: No abdominal pain.  No nausea, no vomiting.  No diarrhea.  No constipation. Genitourinary: Negative for dysuria. Musculoskeletal: Negative for back pain. Skin: Negative for rash. Neurological: Negative for focal weakness or numbness.   ____________________________________________   PHYSICAL EXAM:  VITAL SIGNS: ED Triage Vitals  Enc Vitals Group     BP 09/29/18 2031 (!) 152/119     Pulse Rate 09/29/18 2031 61     Resp 09/29/18 2031 17     Temp 09/29/18 2031 98.6 F (37 C)     Temp Source 09/29/18 2031 Oral     SpO2 09/29/18 2031 96 %     Weight 09/29/18 2033 118 lb (53.5 kg)     Height 09/29/18 2033 5\' 3"  (1.6 m)     Head Circumference --      Peak Flow --      Pain Score 09/29/18 2033 8     Pain Loc --      Pain Edu? --      Excl. in [place]? --      Constitutional: Alert and oriented. Well appearing and in no acute distress. Eyes: Conjunctivae are normal.  Head: Atraumatic.  No tenderness to palpation but no hematoma.  No depression of the skull. Nose: No congestion/rhinnorhea. Mouth/Throat: Mucous membranes are moist.  Neck: No stridor.  No tenderness to palpation to the posterior cervical spine.  No deformity or step-off.   Cardiovascular: Normal rate, regular rhythm. Grossly normal heart sounds.   Respiratory: Normal respiratory effort.  No  retractions. Lungs CTAB. Gastrointestinal: Soft and nontender. No distention. No CVA tenderness. Musculoskeletal: No lower extremity tenderness nor edema.  No joint effusions.  No tenderness to palpation of bilateral hips.  5 out of 5 strength bilateral lower 70s.  Pelvis is stable.  No tenderness to palpation along the thoracic or lumbar spines.  No step-off nor deformity. Neurologic:  Normal speech and language. No gross focal neurologic deficits are appreciated. Skin:  Skin is warm, dry and intact. No rash noted. Psychiatric: Mood and affect are normal. Speech and behavior are normal.  ____________________________________________   LABS (all labs ordered are listed, but only abnormal results are displayed)  Labs Reviewed  BASIC METABOLIC PANEL - Abnormal; Notable for the following components:      Result Value   Potassium 3.3 (*)    Glucose, Bld 157 (*)    Calcium 8.6 (*)    All other components within normal limits  CBC - Abnormal; Notable for the following components:   Hemoglobin 10.4 (*)    HCT 30.6 (*)    RDW 16.7 (*)    All other components within normal limits  PROTIME-INR - Abnormal; Notable for the following components:   Prothrombin Time 22.8 (*)    All other components within normal limits  TROPONIN I  TROPONIN I  URINALYSIS, COMPLETE (UACMP) WITH MICROSCOPIC   ____________________________________________  EKG  ED ECG REPORT I, Doran Stabler, the attending  physician, personally viewed and interpreted this ECG.   Date: 09/29/2018  EKG Time: 2032  Rate: 61  Rhythm: normal sinus rhythm  Axis: Right axis  Intervals:none  ST&T Change: No ST segment elevation or depression.  No abnormal T wave inversion.  ____________________________________________  RADIOLOGY  CT head without acute finding. ____________________________________________   PROCEDURES  Procedure(s) performed: Procedures  Critical Care performed: No  ____________________________________________   INITIAL IMPRESSION / ASSESSMENT AND PLAN / ED COURSE  Pertinent labs & imaging results that were available during my care of the patient were reviewed by me and considered in my medical decision making (see chart for details).  DDX: Syncopal episode, vasovagal episode, arrhythmia, intracranial hemorrhage, concussion, seizure As part of my medical decision making, I reviewed the following data within the [place] Notes from prior ED visits  ----------------------------------------- 12:03 AM on 09/30/2018 -----------------------------------------  Patient at this time with 2- troponins.  Reassuring work-up.  Patient says she went to the bathroom and did not give a sample.  Recent UTI but without any burning or frequency recently and patient finished course of antibiotics at home.  Family at the bedside and says that the patient did have several cycles of convulsive activity and then went limp.  Hands were clammy.  Family also reports patient had just finished eating.  Seizure versus syncope but favors more towards syncope because of patient with prodrome of dizziness.  Family also concerned because patient recently started an antidepressant thinks it may cause the dizziness and episode this evening.  Patient was able to ambulate to the toilet in the room and back without any issue including dizziness or lightheadedness.  Also, the patient has no tongue bite.  We  will give follow-up information for neurology.  To be discharged at this time.  Patient says that she will be unable to give further urine samples as she says that she urinates infrequently. ____________________________________________   FINAL CLINICAL IMPRESSION(S) / ED DIAGNOSES  Syncopal episode.   NEW MEDICATIONS STARTED DURING THIS VISIT:  New Prescriptions  No medications on file     Note:  This document was prepared using Dragon voice recognition software and may include unintentional dictation errors.     Orbie Pyo, MD 09/30/18 [phone]

## 2018-10-02 ENCOUNTER — Ambulatory Visit (INDEPENDENT_AMBULATORY_CARE_PROVIDER_SITE_OTHER): Payer: Medicare Other

## 2018-10-02 DIAGNOSIS — Z7901 Long term (current) use of anticoagulants: Secondary | ICD-10-CM

## 2018-10-02 DIAGNOSIS — I480 Paroxysmal atrial fibrillation: Secondary | ICD-10-CM

## 2018-10-02 LAB — POCT INR: INR: 3.1 — AB (ref 2.0–3.0)

## 2018-10-02 NOTE — Patient Instructions (Signed)
INR today 3.1  Hold warfarin tomorrow (10/4) as has already taken dose today.  Then resume prior dosing of 1 (3mg ) pill per day then recheck in 1-2 weeks.  Patient here with son, both verbalize understanding of directions. They understand risks associated with a supratherapeutic level and will go to ER if any concerns develop.

## 2018-10-03 ENCOUNTER — Encounter: Payer: Self-pay | Admitting: Oncology

## 2018-10-03 ENCOUNTER — Inpatient Hospital Stay: Payer: Medicare Other

## 2018-10-03 ENCOUNTER — Other Ambulatory Visit: Payer: Medicare Other

## 2018-10-03 ENCOUNTER — Inpatient Hospital Stay (HOSPITAL_BASED_OUTPATIENT_CLINIC_OR_DEPARTMENT_OTHER): Payer: Medicare Other | Admitting: Oncology

## 2018-10-03 ENCOUNTER — Ambulatory Visit: Payer: Medicare Other | Admitting: Oncology

## 2018-10-03 ENCOUNTER — Inpatient Hospital Stay: Payer: Medicare Other | Attending: Oncology

## 2018-10-03 ENCOUNTER — Other Ambulatory Visit: Payer: Self-pay

## 2018-10-03 ENCOUNTER — Ambulatory Visit: Payer: Medicare Other

## 2018-10-03 VITALS — BP 110/66 | HR 59 | Temp 97.7°F | Resp 18 | Wt 111.5 lb

## 2018-10-03 VITALS — BP 155/78 | HR 60 | Temp 96.6°F | Resp 18

## 2018-10-03 DIAGNOSIS — Z87891 Personal history of nicotine dependence: Secondary | ICD-10-CM | POA: Diagnosis not present

## 2018-10-03 DIAGNOSIS — C519 Malignant neoplasm of vulva, unspecified: Secondary | ICD-10-CM

## 2018-10-03 DIAGNOSIS — E538 Deficiency of other specified B group vitamins: Secondary | ICD-10-CM

## 2018-10-03 DIAGNOSIS — Z923 Personal history of irradiation: Secondary | ICD-10-CM | POA: Insufficient documentation

## 2018-10-03 DIAGNOSIS — I480 Paroxysmal atrial fibrillation: Secondary | ICD-10-CM

## 2018-10-03 DIAGNOSIS — R7881 Bacteremia: Secondary | ICD-10-CM | POA: Diagnosis not present

## 2018-10-03 DIAGNOSIS — G893 Neoplasm related pain (acute) (chronic): Secondary | ICD-10-CM | POA: Insufficient documentation

## 2018-10-03 DIAGNOSIS — Z9079 Acquired absence of other genital organ(s): Secondary | ICD-10-CM | POA: Diagnosis not present

## 2018-10-03 DIAGNOSIS — I959 Hypotension, unspecified: Secondary | ICD-10-CM | POA: Diagnosis not present

## 2018-10-03 DIAGNOSIS — E861 Hypovolemia: Secondary | ICD-10-CM | POA: Diagnosis not present

## 2018-10-03 DIAGNOSIS — R42 Dizziness and giddiness: Secondary | ICD-10-CM | POA: Diagnosis not present

## 2018-10-03 DIAGNOSIS — R5383 Other fatigue: Secondary | ICD-10-CM | POA: Insufficient documentation

## 2018-10-03 DIAGNOSIS — R600 Localized edema: Secondary | ICD-10-CM | POA: Diagnosis not present

## 2018-10-03 DIAGNOSIS — K5900 Constipation, unspecified: Secondary | ICD-10-CM | POA: Insufficient documentation

## 2018-10-03 DIAGNOSIS — Z7901 Long term (current) use of anticoagulants: Secondary | ICD-10-CM | POA: Insufficient documentation

## 2018-10-03 DIAGNOSIS — R531 Weakness: Secondary | ICD-10-CM | POA: Diagnosis not present

## 2018-10-03 DIAGNOSIS — I10 Essential (primary) hypertension: Secondary | ICD-10-CM | POA: Diagnosis not present

## 2018-10-03 DIAGNOSIS — Z79899 Other long term (current) drug therapy: Secondary | ICD-10-CM

## 2018-10-03 DIAGNOSIS — R5381 Other malaise: Secondary | ICD-10-CM

## 2018-10-03 DIAGNOSIS — R197 Diarrhea, unspecified: Secondary | ICD-10-CM | POA: Insufficient documentation

## 2018-10-03 DIAGNOSIS — Z5111 Encounter for antineoplastic chemotherapy: Secondary | ICD-10-CM

## 2018-10-03 DIAGNOSIS — E785 Hyperlipidemia, unspecified: Secondary | ICD-10-CM | POA: Insufficient documentation

## 2018-10-03 DIAGNOSIS — F419 Anxiety disorder, unspecified: Secondary | ICD-10-CM | POA: Insufficient documentation

## 2018-10-03 DIAGNOSIS — Z7189 Other specified counseling: Secondary | ICD-10-CM

## 2018-10-03 LAB — CBC WITH DIFFERENTIAL/PLATELET
BASOS PCT: 1 %
Basophils Absolute: 0.1 10*3/uL (ref 0–0.1)
Eosinophils Absolute: 0.2 10*3/uL (ref 0–0.7)
Eosinophils Relative: 2 %
HEMATOCRIT: 31.8 % — AB (ref 35.0–47.0)
Hemoglobin: 10.5 g/dL — ABNORMAL LOW (ref 12.0–16.0)
LYMPHS ABS: 1.2 10*3/uL (ref 1.0–3.6)
Lymphocytes Relative: 14 %
MCH: 26.9 pg (ref 26.0–34.0)
MCHC: 33.1 g/dL (ref 32.0–36.0)
MCV: 81.2 fL (ref 80.0–100.0)
MONO ABS: 0.6 10*3/uL (ref 0.2–0.9)
MONOS PCT: 8 %
NEUTROS ABS: 6.2 10*3/uL (ref 1.4–6.5)
Neutrophils Relative %: 75 %
PLATELETS: 226 10*3/uL (ref 150–440)
RBC: 3.92 MIL/uL (ref 3.80–5.20)
RDW: 17 % — ABNORMAL HIGH (ref 11.5–14.5)
WBC: 8.2 10*3/uL (ref 3.6–11.0)

## 2018-10-03 LAB — COMPREHENSIVE METABOLIC PANEL
ALBUMIN: 2.7 g/dL — AB (ref 3.5–5.0)
ALT: 11 U/L (ref 0–44)
ANION GAP: 7 (ref 5–15)
AST: 20 U/L (ref 15–41)
Alkaline Phosphatase: 65 U/L (ref 38–126)
BUN: 14 mg/dL (ref 8–23)
CALCIUM: 9.1 mg/dL (ref 8.9–10.3)
CHLORIDE: 103 mmol/L (ref 98–111)
CO2: 28 mmol/L (ref 22–32)
Creatinine, Ser: 0.82 mg/dL (ref 0.44–1.00)
GFR calc Af Amer: >60 mL/min (ref >60)
GFR calc non Af Amer: >60 mL/min (ref >60)
Glucose, Bld: 166 mg/dL — ABNORMAL HIGH (ref 70–99)
POTASSIUM: 4.3 mmol/L (ref 3.5–5.1)
SODIUM: 138 mmol/L (ref 135–145)
Total Bilirubin: 0.7 mg/dL (ref 0.3–1.2)
Total Protein: 6.6 g/dL (ref 6.5–8.1)

## 2018-10-03 MED ORDER — HEPARIN SOD (PORK) LOCK FLUSH 100 UNIT/ML IV SOLN
INTRAVENOUS | Status: AC
  Filled 2018-10-03: qty 5

## 2018-10-03 MED ORDER — SODIUM CHLORIDE 0.9 % IV SOLN
135.0000 mg/m2 | Freq: Once | INTRAVENOUS | Status: AC
  Administered 2018-10-03: 198 mg via INTRAVENOUS
  Filled 2018-10-03: qty 33

## 2018-10-03 MED ORDER — CARBOPLATIN CHEMO INJECTION 450 MG/45ML
254.0000 mg | Freq: Once | INTRAVENOUS | Status: AC
  Administered 2018-10-03: 250 mg via INTRAVENOUS
  Filled 2018-10-03: qty 25

## 2018-10-03 MED ORDER — PALONOSETRON HCL INJECTION 0.25 MG/5ML
0.2500 mg | Freq: Once | INTRAVENOUS | Status: AC
  Administered 2018-10-03: 0.25 mg via INTRAVENOUS
  Filled 2018-10-03: qty 5

## 2018-10-03 MED ORDER — SODIUM CHLORIDE 0.9 % IV SOLN
20.0000 mg | Freq: Once | INTRAVENOUS | Status: AC
  Administered 2018-10-03: 20 mg via INTRAVENOUS
  Filled 2018-10-03: qty 2

## 2018-10-03 MED ORDER — SODIUM CHLORIDE 0.9 % IV SOLN
Freq: Once | INTRAVENOUS | Status: AC
  Administered 2018-10-03: 10:00:00 via INTRAVENOUS
  Filled 2018-10-03: qty 250

## 2018-10-03 MED ORDER — HEPARIN SOD (PORK) LOCK FLUSH 100 UNIT/ML IV SOLN
500.0000 [IU] | Freq: Once | INTRAVENOUS | Status: AC
  Administered 2018-10-03: 500 [IU] via INTRAVENOUS

## 2018-10-03 MED ORDER — FAMOTIDINE IN NACL 20-0.9 MG/50ML-% IV SOLN
20.0000 mg | Freq: Once | INTRAVENOUS | Status: AC
  Administered 2018-10-03: 20 mg via INTRAVENOUS
  Filled 2018-10-03: qty 50

## 2018-10-03 MED ORDER — DIPHENHYDRAMINE HCL 50 MG/ML IJ SOLN
50.0000 mg | Freq: Once | INTRAMUSCULAR | Status: AC
  Administered 2018-10-03: 50 mg via INTRAVENOUS
  Filled 2018-10-03: qty 1

## 2018-10-03 NOTE — Progress Notes (Signed)
Hematology/Oncology follow up  note [facility] [phone] [phone]   Patient Care Team: Pleas Koch, NP as PCP - General (Internal Medicine) Clent Jacks, RN as Registered Nurse  REFERRING PROVIDER: [facility] and Blood Specialist.  REASON FOR VISIT Follow up for treatment of vulvar cancer  HISTORY OF PRESENTING ILLNESS:  Virginia Murray is a  77 y.o.  female with PMH listed below who was referred to me for evaluation of vulvar cancer.  Patient recently relocated from [place] to [place]. Reviewed medical records sent to [place] from [place] cancer in the blood specialist Dr. Marney Doctor, who is patient's previous oncologist. 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  She completed radiation to vulva and the right groin in August 2007 at [facility].  Since then she was on surveillance.  She was advised to have follow-up but she had to postpone as she was taking care of her husband was quite ill and unfortunately passed away On 2018/08/07, patient was found to have a 7 cm ulcerated lesion in the left vulva partially involving the vaginal and rectum on examination.  The lesion was biopsied by Dr. Tessa Lerner on 07/29/2018, PET scan showed ulcerated perineal mass 8.7 x 4.2 x 5.3 centimeters, SUV 12, extending into the region of gluteal cleft.  Left inguinal lymph nodes measured [1.1 x 0.7 cm]  SUV 1.6.  Which appeared nonspecific possibly metastatic or reactive. There was no evidence of distant metastasis. Given prior radiation treatment, she was advised by Dr. Tessa Lerner for chemotherapy.  She met Dr.Christi Maudie Mercury for discussion of treatment options.  She was recommended to start dose reduced carboplatin and Taxol.  Also NexGen sequencing PDL 1 testing was discussed, unknown if  testing were sent.  Patient did not start treatment as she is relocating to [place] to live with patient's son and wants to establish care with [facility] cancer center.  Today patient reports moderate discomfort and pain in the vulvar area.  She also has some vaginal discharge and wear pads. Reports bleeding from her private area.  Denies any symptoms of voiding trouble, dysuria or incontinence. Reports constipation.  Past medical history includes paroxysmal atrial fibrillation and take coumadin 38m daily. She has not established care with PCP locally. No INR has been checked recently.   She had labs done on 08/13/2018 at [facility]. BUN 21.3, creatinine 1.81, sodium 143, potassium 4, chloride 103, calcium 9.4, total protein 6.9, albumin 3.4, alkaline phosphatase 99 total bili 8.3, ALT 13 AST 18, iron 36, TIBC 241, iron saturation 14.9, magnesium 1.5, phosphorus 3.3, LDH 166, uric acid 5.8.  Folate 18.9, ferritin 84, B12 362, CEA 1.6. 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 The specimen biopsy only represents the superficial part of the lesion and focal invasion cannot be excluded. We have not received pathology slides to be evaluated by our pathologist. PET scan 07/29/2018 showed ulcerated perineal mass with known recurrent vulvar carcinoma.  Involving the left greater than right with posterior extension into the region of gluteal cleft approximately 8.7 x 4.2 x 5.3 cm.  SUV 12.  There is also minimally FDG avid subcentimeter left inguinal lymph nodes, nonspecific.  Metastatic  versus reactive. We also received gynecology oncology operative note in September 2014.  Patient had vulvar biopsy x3 and a perirectal biopsy x1 at that time.  Pathology results not available  to [place].   # Discussed with patient and her granddaughter who accompanied patient to today's visit that patient has disease recurrence with involvement of the vaginal and the rectum.  Discussed with GYN oncology Dr. Theora Gianotti.  Surgery is not an option due to her poor performance status.  Exact duration will be palliative only. We agree with the plan for chemotherapy with dose reduced carboplatin and Taxol. Avastin is not a good option for her given bowel involvement and a high risk for fistulization.  # It is unknown whether PDL 1 marker and Mosier status was being sent from previous oncologist office or not. We are is doing a process of obtaining patient's pathology slides and blocks.  If it has not been sent before, we plan to send above testing.  # Most recent pathology testing showed at least squamous cell carcinoma in situ, invasive component cannot be ruled out.  This may be secondary to a suboptimal biopsy.  Given patient's poor performance and severe symptoms, I do not think a repeat biopsy is going to add any value to current management. Dr.Secord aware about pathology and also agrees that proceeding with chemotherapy without re-biopsy is reasonable.  Patient also agreed to proceed with chemotherapy treatments based on pathology diagnosis from outside pathologist. The diagnosis and care plan were discussed with patient in detail.    INTERVAL HISTORY Virginia Murray is a 77 y.o. female who has above history reviewed by me today presents for assessment prior to chemotherapy for treatment of recurrent vulvar cancer. # Recently hospitalization due to Group G Strep Bacteremia.  She has finished 2 weeks of IV Ceftriaxone.  Denies any fever or chills. She recently establish care with primary care physician.  For her anxiety she was started on Zoloft. Patient had a ED visit last week due to syncope episode.  She also felt dizzy.  Was considered most likely syncope events was secondary to new  antidepressants.  Patient stopped taking Zoloft.  She has not had any additional syncope or dizziness episodes since then.  Patient was also given follow-up information with neurology as well.  # Neoplasm related pain: Currently pain is better controlled with fentanyl patch and oxycodone 10 mg as needed.  She used usually 1-2 during the day.  Pain is better controlled. # Chronic anticoagulation on Coumadin.  She has establish care with primary care physician for Coumadin dosing and monitoring.  # Blood pressure: Blood pressure is better controlled today.  She takes amlodipine, atenolol, benazepril. Still feels tired chronic at baseline.  Review of Systems  Constitutional: Positive for malaise/fatigue. Negative for chills, fever and weight loss.  HENT: Negative for nosebleeds and sore throat.   Eyes: Negative for double vision, photophobia and redness.  Respiratory: Negative for cough, shortness of breath and wheezing.   Cardiovascular: Negative for chest pain, palpitations and orthopnea.  Gastrointestinal: Negative for abdominal pain, blood in stool, constipation, nausea and vomiting.  Genitourinary: Negative for dysuria.       Soreness/pain around Vulva mass, spotting blood.  Musculoskeletal: Negative for back pain, myalgias and neck pain.  Skin: Negative for itching and rash.  Neurological: Negative for dizziness, tingling and tremors.  Endo/Heme/Allergies: Negative for environmental allergies. Does not bruise/bleed easily.  Psychiatric/Behavioral: Negative for depression. The patient is nervous/anxious.     MEDICAL HISTORY:  Past Medical History:  Diagnosis Date  . A-fib ([place])   . Cancer (HCC)    vulva   . Depression   . Diverticulitis   . Hyperlipemia   . Hypertension   . Vulva cancer [facility])     SURGICAL HISTORY: Past Surgical History:  Procedure Laterality Date  . ABDOMINAL HYSTERECTOMY    . CATARACT EXTRACTION, BILATERAL    . KNEE SURGERY     right knee   . PORTA CATH  INSERTION N/A 09/04/2018   Procedure: PORTA CATH INSERTION;  Surgeon: Algernon Huxley, MD;  Location: [place] CV LAB;  Service: Cardiovascular;  Laterality: N/A;  . TEE WITHOUT CARDIOVERSION N/A 09/16/2018   Procedure: TRANSESOPHAGEAL ECHOCARDIOGRAM (TEE);  Surgeon: Nelva Bush, MD;  Location: [facility] ORS;  Service: Cardiovascular;  Laterality: N/A;  . TONSILLECTOMY    . VULVA SURGERY      SOCIAL HISTORY: Social History   Socioeconomic History  . Marital status: Widowed    Spouse name: Not on file  . Number of children: 2  . Years of education: Not on file  . Highest education level: Not on file  Occupational History  . Not on file  Social Needs  . Financial resource strain: Not on file  . Food insecurity:    Worry: Not on file    Inability: Not on file  . Transportation needs:    Medical: Not on file    Non-medical: Not on file  Tobacco Use  . Smoking status: Former Smoker    Packs/day: 1.00    Years: 20.00    Pack years: 20.00    Types: Cigarettes    Last attempt to quit: 08/29/1993    Years since quitting: 25.1  . Smokeless tobacco: Never Used  Substance and Sexual Activity  . Alcohol use: Not Currently  . Drug use: Never  . Sexual activity: Not Currently  Lifestyle  . Physical activity:    Days per week: Not on file    Minutes per session: Not on file  . Stress: Not on file  Relationships  . Social connections:    Talks on phone: Not on file    Gets together: Not on file    Attends religious service: Not on file    Active member of club or organization: Not on file    Attends meetings of clubs or organizations: Not on file    Relationship status: Not on file  . Intimate partner violence:    Fear of current or ex partner: Not on file    Emotionally abused: Not on file    Physically abused: Not on file    Forced sexual activity: Not on file  Other Topics Concern  . Not on file  Social History Narrative  . Not on file    FAMILY HISTORY: Family  History  Problem Relation Age of Onset  . Alzheimer's disease Mother   . Heart disease Father     ALLERGIES:  has No Known Allergies.  MEDICATIONS:  Current Outpatient Medications  Medication Sig Dispense Refill  . amLODipine (NORVASC) 2.5 MG tablet Take 2.5 mg by mouth daily.    Marland Kitchen atenolol (TENORMIN) 100 MG tablet Take 100 mg by mouth daily.    . benazepril (LOTENSIN) 40 MG tablet Take 40 mg by mouth daily.    Marland Kitchen dexamethasone (DECADRON) 4 MG tablet Take 2 tablets (8 mg total) by mouth daily. Start the day after chemotherapy for 2 days. 30 tablet 1  . doxazosin (CARDURA) 8  MG tablet Take 8 mg by mouth daily.    . fentaNYL (DURAGESIC - DOSED MCG/HR) 25 MCG/HR patch Place 1 patch (25 mcg total) onto the skin every 3 (three) days. 10 patch 0  . lidocaine-prilocaine (EMLA) cream Apply to affected area once 30 g 3  . ondansetron (ZOFRAN) 8 MG tablet Take 1 tablet (8 mg total) by mouth 2 (two) times daily as needed for refractory nausea / vomiting. Start on day 3 after chemo. 30 tablet 1  . oxyCODONE (ROXICODONE) 5 MG immediate release tablet Take 1 tablet (5 mg total) by mouth every 4 (four) hours as needed for moderate pain or severe pain. 30 tablet 0  . polyethylene glycol (MIRALAX) packet Take 17 g by mouth daily as needed for mild constipation or moderate constipation. 30 each 0  . pravastatin (PRAVACHOL) 40 MG tablet Take 40 mg by mouth daily.    . prochlorperazine (COMPAZINE) 10 MG tablet Take 1 tablet (10 mg total) by mouth every 6 (six) hours as needed (Nausea or vomiting). 30 tablet 1  . senna (SENOKOT) 8.6 MG TABS tablet Take 2 tablets (17.2 mg total) by mouth daily. 120 each 0  . vitamin B-12 1000 MCG tablet Take 1 tablet (1,000 mcg total) by mouth daily. 120 tablet 0  . warfarin (COUMADIN) 1 MG tablet Take 3 tablets (3 mg total) by mouth daily. 60 tablet 0  . sertraline (ZOLOFT) 25 MG tablet Take 1 tablet (25 mg total) by mouth daily. (Patient not taking: Reported on 10/03/2018) 30  tablet 1   No current facility-administered medications for this visit.    Facility-Administered Medications Ordered in Other Visits  Medication Dose Route Frequency Provider Last Rate Last Dose  . CARBOplatin (PARAPLATIN) 250 mg in sodium chloride 0.9 % 250 mL chemo infusion  250 mg Intravenous Once Earlie Server, MD      . heparin lock flush 100 unit/mL  500 Units Intravenous Once Earlie Server, MD      . PACLitaxel (TAXOL) 198 mg in sodium chloride 0.9 % 250 mL chemo infusion (> 37m/m2)  135 mg/m2 (Treatment Plan Recorded) Intravenous Once YEarlie Server MD 94 mL/hr at 10/03/18 1106 198 mg at 10/03/18 1106     PHYSICAL EXAMINATION: ECOG PERFORMANCE STATUS: 2 - Symptomatic, <50% confined to bed Vitals:   10/03/18 0852  BP: 110/66  Pulse: (!) 59  Resp: 18  Temp: 97.7 F (36.5 C)   Filed Weights   10/03/18 0852  Weight: 111 lb 8 oz (50.6 kg)    Physical Exam  Constitutional: She is oriented to person, place, and time. No distress.  Frail appearance elderly female, mild distress sitting on pillow  HENT:  Head: Normocephalic and atraumatic.  Mouth/Throat: Oropharynx is clear and moist.  Eyes: Pupils are equal, round, and reactive to light. EOM are normal. No scleral icterus.  Neck: Normal range of motion. Neck supple.  Cardiovascular: Normal rate and normal heart sounds.  Pulmonary/Chest: Effort normal. No respiratory distress. She has no wheezes.  Abdominal: Soft. Bowel sounds are normal. She exhibits no distension and no mass. There is no tenderness.  Genitourinary: Vaginal discharge found.  Genitourinary Comments:  Left vulvar ulcerated mass extending to the vangina,   Musculoskeletal: Normal range of motion. She exhibits no edema or deformity.  Neurological: She is alert and oriented to person, place, and time. No cranial nerve deficit. Coordination normal.  Skin: Skin is warm and dry. No rash noted. No erythema.  Psychiatric: She has a normal mood and  affect.     LABORATORY DATA:   I have reviewed the data as listed Lab Results  Component Value Date   WBC 8.2 10/03/2018   HGB 10.5 (L) 10/03/2018   HCT 31.8 (L) 10/03/2018   MCV 81.2 10/03/2018   PLT 226 10/03/2018   Recent Labs    09/13/18 0858  09/24/18 0857 09/29/18 2041 10/03/18 0843  NA 136   < > 140 137 138  K 4.2   < > 3.9 3.3* 4.3  CL 105   < > 105 103 103  CO2 24   < > 28 27 28   GLUCOSE 121*   < > 134* 157* 166*  BUN 31*   < > 12 12 14   CREATININE 0.94   < > 0.72 0.74 0.82  CALCIUM 8.6*   < > 8.8* 8.6* 9.1  GFRNONAA 58*   < > >60 >60 >60  GFRAA >60   < > >60 >60 >60  PROT 6.2*  --  6.5  --  6.6  ALBUMIN 2.7*  --  2.5*  --  2.7*  AST 15  --  20  --  20  ALT 10  --  12  --  11  ALKPHOS 67  --  68  --  65  BILITOT 0.7  --  0.6  --  0.7   < > = values in this interval not displayed.   Iron/TIBC/Ferritin/ %Sat    Component Value Date/Time   IRON 19 (L) 09/13/2018 1725   TIBC 196 (L) 09/13/2018 1725   FERRITIN 66 09/13/2018 1725   IRONPCTSAT 10 (L) 09/13/2018 1725     RADIOGRAPHIC STUDIES: I have personally reviewed the radiological images as listed and agreed with the findings in the report. CT head wo contrast 09/29/2018  IMPRESSION: Stable atrophy pattern and chronic white matter microvascular ischemic changes. No acute intracranial abnormality by noncontrast CT.  ASSESSMENT & PLAN:  1. Bacteremia   2. Vulvar cancer ([place])   3. Chronic anticoagulation   4. Neoplasm related pain   5. Encounter for antineoplastic chemotherapy   6. Goals of care, counseling/discussion    # Vulvar cancer,  Labs reviewed and discussed with patient.  Counts acceptable to proceed with dose reduced carboplatin AUC 4 with Taxol 135 mg/m, proceed with cycle 1 today.  #Bacteremia, finished extended course of IV antibiotics.  Afebrile today. I have talked with ID and will obtain blood culture through her [facility] 1 week after she finishes her IV antibiotics.  #Neoplasm related pain continue oxycodone 37m  every 4-6 hours as needed. Fentanyl patch 247m/h Q72h.    Discussed the potential side effects and treatment/ prevention  of constipation,  respiratory depression,  and mental status changes.  #Atrial fibrillation on chronic anticoagulation with Coumadin.  She has establish care with primary care physician for INR checking and Coumadin dosing. # Vitamin B12 deficiency, continue oral vitamin B12 100096mdaily.   #Goal of care, palliative intent.  Discussed with patient.  With previously discussed about referring patient to palliative service and the patient became agitated and were declined.  Today she appears to be in a better mood.  So we discussed about palliative care referral again.  She agrees.  Will refer to palliative NP JosVonna Kotyke spent sufficient time to discuss many aspect of care, questions were answered to patient's satisfaction.  All questions were answered. The patient knows to call the clinic with any problems questions or concerns.  Return of visit: 1 week  for assessment of tolerability  Earlie Server, MD, PhD Hematology Oncology [facility] Pager- [phone] 10/03/2018

## 2018-10-03 NOTE — Progress Notes (Signed)
Patient here for follow up. She states she is off of anxiety medication because it was making her dizzy.

## 2018-10-07 ENCOUNTER — Encounter: Payer: Self-pay | Admitting: Nurse Practitioner

## 2018-10-07 ENCOUNTER — Inpatient Hospital Stay: Payer: Medicare Other

## 2018-10-07 ENCOUNTER — Ambulatory Visit: Payer: Self-pay

## 2018-10-07 ENCOUNTER — Inpatient Hospital Stay (HOSPITAL_BASED_OUTPATIENT_CLINIC_OR_DEPARTMENT_OTHER): Payer: Medicare Other | Admitting: Nurse Practitioner

## 2018-10-07 ENCOUNTER — Telehealth: Payer: Self-pay | Admitting: *Deleted

## 2018-10-07 VITALS — BP 88/55 | HR 69 | Temp 99.4°F | Resp 18

## 2018-10-07 DIAGNOSIS — Z9842 Cataract extraction status, left eye: Secondary | ICD-10-CM

## 2018-10-07 DIAGNOSIS — R413 Other amnesia: Secondary | ICD-10-CM | POA: Diagnosis present

## 2018-10-07 DIAGNOSIS — C519 Malignant neoplasm of vulva, unspecified: Secondary | ICD-10-CM | POA: Diagnosis present

## 2018-10-07 DIAGNOSIS — G4762 Sleep related leg cramps: Secondary | ICD-10-CM | POA: Diagnosis not present

## 2018-10-07 DIAGNOSIS — Z79891 Long term (current) use of opiate analgesic: Secondary | ICD-10-CM

## 2018-10-07 DIAGNOSIS — Z95828 Presence of other vascular implants and grafts: Secondary | ICD-10-CM

## 2018-10-07 DIAGNOSIS — I10 Essential (primary) hypertension: Secondary | ICD-10-CM

## 2018-10-07 DIAGNOSIS — Z8249 Family history of ischemic heart disease and other diseases of the circulatory system: Secondary | ICD-10-CM

## 2018-10-07 DIAGNOSIS — Z6826 Body mass index (BMI) 26.0-26.9, adult: Secondary | ICD-10-CM

## 2018-10-07 DIAGNOSIS — K5903 Drug induced constipation: Secondary | ICD-10-CM | POA: Diagnosis present

## 2018-10-07 DIAGNOSIS — Z9841 Cataract extraction status, right eye: Secondary | ICD-10-CM

## 2018-10-07 DIAGNOSIS — Z66 Do not resuscitate: Secondary | ICD-10-CM | POA: Diagnosis present

## 2018-10-07 DIAGNOSIS — A0472 Enterocolitis due to Clostridium difficile, not specified as recurrent: Principal | ICD-10-CM | POA: Diagnosis present

## 2018-10-07 DIAGNOSIS — Z79899 Other long term (current) drug therapy: Secondary | ICD-10-CM

## 2018-10-07 DIAGNOSIS — Z923 Personal history of irradiation: Secondary | ICD-10-CM

## 2018-10-07 DIAGNOSIS — Z5189 Encounter for other specified aftercare: Secondary | ICD-10-CM

## 2018-10-07 DIAGNOSIS — I9589 Other hypotension: Secondary | ICD-10-CM | POA: Diagnosis present

## 2018-10-07 DIAGNOSIS — M25562 Pain in left knee: Secondary | ICD-10-CM

## 2018-10-07 DIAGNOSIS — G893 Neoplasm related pain (acute) (chronic): Secondary | ICD-10-CM | POA: Diagnosis present

## 2018-10-07 DIAGNOSIS — Z9089 Acquired absence of other organs: Secondary | ICD-10-CM

## 2018-10-07 DIAGNOSIS — F411 Generalized anxiety disorder: Secondary | ICD-10-CM | POA: Diagnosis present

## 2018-10-07 DIAGNOSIS — E785 Hyperlipidemia, unspecified: Secondary | ICD-10-CM | POA: Diagnosis present

## 2018-10-07 DIAGNOSIS — Z8619 Personal history of other infectious and parasitic diseases: Secondary | ICD-10-CM

## 2018-10-07 DIAGNOSIS — R197 Diarrhea, unspecified: Secondary | ICD-10-CM | POA: Diagnosis not present

## 2018-10-07 DIAGNOSIS — M25561 Pain in right knee: Secondary | ICD-10-CM

## 2018-10-07 DIAGNOSIS — Z634 Disappearance and death of family member: Secondary | ICD-10-CM

## 2018-10-07 DIAGNOSIS — E440 Moderate protein-calorie malnutrition: Secondary | ICD-10-CM | POA: Diagnosis present

## 2018-10-07 DIAGNOSIS — Z820 Family history of epilepsy and other diseases of the nervous system: Secondary | ICD-10-CM

## 2018-10-07 DIAGNOSIS — Z87891 Personal history of nicotine dependence: Secondary | ICD-10-CM | POA: Diagnosis not present

## 2018-10-07 DIAGNOSIS — I4819 Other persistent atrial fibrillation: Secondary | ICD-10-CM | POA: Diagnosis present

## 2018-10-07 DIAGNOSIS — R531 Weakness: Secondary | ICD-10-CM | POA: Diagnosis present

## 2018-10-07 DIAGNOSIS — D519 Vitamin B12 deficiency anemia, unspecified: Secondary | ICD-10-CM | POA: Diagnosis present

## 2018-10-07 DIAGNOSIS — N179 Acute kidney failure, unspecified: Secondary | ICD-10-CM | POA: Diagnosis present

## 2018-10-07 DIAGNOSIS — T402X5D Adverse effect of other opioids, subsequent encounter: Secondary | ICD-10-CM

## 2018-10-07 DIAGNOSIS — G4700 Insomnia, unspecified: Secondary | ICD-10-CM | POA: Diagnosis present

## 2018-10-07 DIAGNOSIS — R64 Cachexia: Secondary | ICD-10-CM | POA: Diagnosis present

## 2018-10-07 DIAGNOSIS — E860 Dehydration: Secondary | ICD-10-CM | POA: Diagnosis present

## 2018-10-07 DIAGNOSIS — Z515 Encounter for palliative care: Secondary | ICD-10-CM | POA: Diagnosis present

## 2018-10-07 DIAGNOSIS — Z90710 Acquired absence of both cervix and uterus: Secondary | ICD-10-CM

## 2018-10-07 DIAGNOSIS — Z9079 Acquired absence of other genital organ(s): Secondary | ICD-10-CM

## 2018-10-07 DIAGNOSIS — R791 Abnormal coagulation profile: Secondary | ICD-10-CM | POA: Diagnosis present

## 2018-10-07 DIAGNOSIS — Z7901 Long term (current) use of anticoagulants: Secondary | ICD-10-CM

## 2018-10-07 DIAGNOSIS — F329 Major depressive disorder, single episode, unspecified: Secondary | ICD-10-CM | POA: Diagnosis present

## 2018-10-07 LAB — CBC WITH DIFFERENTIAL/PLATELET
ABS IMMATURE GRANULOCYTES: 0.06 10*3/uL (ref 0.00–0.07)
BASOS PCT: 0 %
Basophils Absolute: 0 10*3/uL (ref 0.0–0.1)
Eosinophils Absolute: 0.2 10*3/uL (ref 0.0–0.5)
Eosinophils Relative: 2 %
HCT: 35 % — ABNORMAL LOW (ref 36.0–46.0)
Hemoglobin: 10.7 g/dL — ABNORMAL LOW (ref 12.0–15.0)
Immature Granulocytes: 1 %
Lymphocytes Relative: 12 %
Lymphs Abs: 1 10*3/uL (ref 0.7–4.0)
MCH: 25.5 pg — ABNORMAL LOW (ref 26.0–34.0)
MCHC: 30.6 g/dL (ref 30.0–36.0)
MCV: 83.5 fL (ref 80.0–100.0)
MONOS PCT: 1 %
Monocytes Absolute: 0.1 10*3/uL (ref 0.1–1.0)
NEUTROS PCT: 84 %
Neutro Abs: 7.1 10*3/uL (ref 1.7–7.7)
PLATELETS: 217 10*3/uL (ref 150–400)
RBC: 4.19 MIL/uL (ref 3.87–5.11)
RDW: 15.9 % — ABNORMAL HIGH (ref 11.5–15.5)
WBC: 8.4 10*3/uL (ref 4.0–10.5)
nRBC: 0 % (ref 0.0–0.2)

## 2018-10-07 LAB — COMPREHENSIVE METABOLIC PANEL
ALT: 13 U/L (ref 0–44)
AST: 24 U/L (ref 15–41)
Albumin: 2.7 g/dL — ABNORMAL LOW (ref 3.5–5.0)
Alkaline Phosphatase: 59 U/L (ref 38–126)
Anion gap: 10 (ref 5–15)
BUN: 30 mg/dL — AB (ref 8–23)
CHLORIDE: 102 mmol/L (ref 98–111)
CO2: 23 mmol/L (ref 22–32)
Calcium: 8.6 mg/dL — ABNORMAL LOW (ref 8.9–10.3)
Creatinine, Ser: 0.96 mg/dL (ref 0.44–1.00)
GFR calc Af Amer: >60 mL/min (ref >60)
GFR, EST NON AFRICAN AMERICAN: 57 mL/min — AB (ref >60)
Glucose, Bld: 186 mg/dL — ABNORMAL HIGH (ref 70–99)
POTASSIUM: 4.5 mmol/L (ref 3.5–5.1)
Sodium: 135 mmol/L (ref 135–145)
Total Bilirubin: 1 mg/dL (ref 0.3–1.2)
Total Protein: 6.2 g/dL — ABNORMAL LOW (ref 6.5–8.1)

## 2018-10-07 LAB — MAGNESIUM: MAGNESIUM: 1.5 mg/dL — AB (ref 1.7–2.4)

## 2018-10-07 MED ORDER — MORPHINE SULFATE (PF) 2 MG/ML IV SOLN
2.0000 mg | Freq: Once | INTRAVENOUS | Status: AC
  Administered 2018-10-07: 2 mg via INTRAVENOUS
  Filled 2018-10-07: qty 1

## 2018-10-07 MED ORDER — HEPARIN SOD (PORK) LOCK FLUSH 100 UNIT/ML IV SOLN
500.0000 [IU] | Freq: Once | INTRAVENOUS | Status: AC
  Administered 2018-10-07: 500 [IU] via INTRAVENOUS

## 2018-10-07 MED ORDER — SODIUM CHLORIDE 0.9 % IV SOLN
Freq: Once | INTRAVENOUS | Status: AC
  Administered 2018-10-07: 12:00:00 via INTRAVENOUS
  Filled 2018-10-07: qty 250

## 2018-10-07 MED ORDER — GABAPENTIN 300 MG PO CAPS: 300.0000 mg | ORAL_CAPSULE | Freq: Every day | ORAL | 2 refills | Status: AC

## 2018-10-07 MED ORDER — SODIUM CHLORIDE 0.9 % IV SOLN
4.0000 g | Freq: Once | INTRAVENOUS | Status: AC
  Administered 2018-10-07: 4 g via INTRAVENOUS
  Filled 2018-10-07: qty 8

## 2018-10-07 MED ORDER — SODIUM CHLORIDE 0.9% FLUSH
10.0000 mL | INTRAVENOUS | Status: DC | PRN
  Administered 2018-10-07: 10 mL via INTRAVENOUS
  Filled 2018-10-07: qty 10

## 2018-10-07 NOTE — Telephone Encounter (Signed)
Pt.'s daughter in law reports pt. Is having bilateral leg pain. Had this before Chemo started, but has worsened since she had Chemo last week. Has a Fentanyl patch in place. Instructed that she can take her oxycodone as well. States she also has a call in to Dr. Tasia Catchings and wants to follow up with them in regard to pain management.

## 2018-10-07 NOTE — Progress Notes (Signed)
Symptom Management [place]  [phone] [phone]  Patient Care Team: Virginia Koch, NP as PCP - General (Internal Medicine) Virginia Jacks, RN as Registered Nurse Virginia Server, MD as Medical Oncologist (Medical Murray) Virginia Ends, MD as Consulting Physician (Gynecologic Murray)   Name of the patient: Virginia Murray  [id number]  June 08, 1941   Date of visit: 10/07/18  Diagnosis- Vulvar Cancer  Chief complaint/ Reason for visit- Leg Pain  Heme/Onc history:  Virginia Murray is a  77 y.o.  female with PMH listed below who was initially referred to Dr. Tasia Murray for evaluation of vulvar cancer after she recently relocated from [place] to [place]. Dr. Tasia Murray reviewed medical records sent to [place] from [place] cancer in the blood specialist Dr. Marney Murray, who is patient's previous oncologist. 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  She completed radiation to vulva and the right groin in August 2007 at [facility]  September 2014 operative note revealed vulvar biopsy x 3 and perirectal biopsy x 1. Pathology results not available.   Since then she was on surveillance.  She was advised to have follow-up but she had to postpone as she was taking care of her husband who was quite ill and unfortunately passed away.  On 08-18-18, patient was found to have a 7 cm ulcerated lesion in the left vulva partially involving the vaginal and rectum on examination.  The lesion was biopsied by Virginia Murray on 07/29/2018. 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 The specimen biopsy only represents the superficial portion of the lesion and focal invasion cannot be excluded.   07/29/2018- PET- showed ulcerated perineal mass with known recurrent vulvar carcinoma.   Involving the left greater than right with posterior extension into the region of gluteal cleft approximately 8.7 x 4.2 x 5.3 cm.  SUV 12.  There is also minimally FDG avid subcentimeter left inguinal lymph nodes, nonspecific.  Metastatic versus reactive.  Given prior radiation treatment, she was advised by Virginia Murray for chemotherapy.  She met Virginia Murray for discussion of treatment options.  She was recommended to start dose reduced carboplatin and Taxol.  Also NexGen sequencing PDL-1 testing was discussed, unknown if testing were sent.  Patient did not start treatment as she is relocating to [place] to live with patient's son and wants to establish care with [facility] cancer center.   She was evaluated by Virginia Murray, Virginia Murray, who felt surgery was not an option for her due to her poor performance status.  Recommendation for chemotherapy with dose reduced carboplatin and Taxol.  Avastin not recommended given possible bowel involvement and high risk for fistula.  Past medical history significant for paroxysmal atrial fibrillation. She takes coumadin chronically.   Vulvar cancer ([place])   09/03/2018 Initial Diagnosis    Vulvar cancer ([place])    09/03/2018 -  Chemotherapy    The patient had palonosetron (ALOXI) injection 0.25 mg, 0.25 mg, Intravenous,  Once, 1 of 5 cycles Administration: 0.25 mg (10/03/2018) CARBOplatin (PARAPLATIN) 250 mg in sodium chloride 0.9 % 100 mL chemo infusion, 250 mg (100 % of original dose 254 mg), Intravenous,  Once, 1 of 5 cycles Dose modification:   (original dose 254 mg, Cycle 1, Reason: Provider Judgment),   (original dose 254 mg, Cycle 1) Administration: 250 mg (10/03/2018) PACLitaxel (TAXOL) 198 mg in sodium chloride  0.9 % 250 mL chemo infusion (> 51m/m2), 135 mg/m2 = 198 mg (100 % of original dose 135 mg/m2), Intravenous,  Once, 1 of 5 cycles Dose modification: 135 mg/m2 (original dose 135 mg/m2, Cycle 1, Reason: Other (see comments), Comment: performance  status) Administration: 198 mg (10/03/2018)  for chemotherapy treatment.      Interval history- CKasi Murray 77year old female [facility] with complaints of bilateral leg cramps that have been present for several weeks and gradually worsening. She says she had this pain prior to starting chemotherapy. She localizes pain to right and left legs and describes it as a cramping, aching, with moments of sharp pain. She says pain occurs mostly at night and interrupts her sleep. She rates pain 9 of 10. She takes fentanyl and oxycodone for pain associated with her vulvar cancer which she says does not consistently improve her leg pain. She denies swelling, pain exacerbated by walking, or temperature changes. She denies: crepitation, decreased range of motion, deformity, edema, effusion, erythema, tenderness, warmth and trauma. She has had prior history of fluctuating constipation and diarrhea but reports more diarrhea recently. She has not taken anything for this. Denies nausea or vomiting. Reduced oral intake but stable.  She continues Coumadin chronically and is monitored by PCP.  She had recent hospitalization due to group B strep bacteremia and is finished IV ceftriaxone.  Pain control has recently improved with fentanyl patch and oxycodone for breakthrough but she is unable to sit for any period of time.  ECOG FS:2 - Symptomatic, <50% confined to bed  Review of systems- Review of Systems  Constitutional: Positive for malaise/fatigue. Negative for chills, fever and weight loss.  HENT: Negative for congestion, ear discharge, ear pain, sinus pain, sore throat and tinnitus.   Eyes: Negative.   Respiratory: Negative.  Negative for cough, sputum production and shortness of breath.   Cardiovascular: Negative for chest pain, palpitations, orthopnea, claudication and leg swelling.  Gastrointestinal: Negative for abdominal pain, blood in stool, constipation, diarrhea, heartburn,  nausea and vomiting.  Genitourinary: Negative.        Vulvar pain and spotting; not worse.   Musculoskeletal: Positive for myalgias.  Skin: Negative.   Neurological: Negative for dizziness, tingling, weakness and headaches.  Endo/Heme/Allergies: Negative.   Psychiatric/Behavioral: The patient is nervous/anxious.      Current treatment-carbo-Taxol  No Known Allergies  Past Medical History:  Diagnosis Date  . [place]   . Cancer ([facility])    vulva   . Depression   . Diverticulitis   . Hyperlipemia   . Hypertension   . Vulva cancer ([facility]     Past Surgical History:  Procedure Laterality Date  . ABDOMINAL HYSTERECTOMY    . CATARACT EXTRACTION, BILATERAL    . KNEE SURGERY     right knee   . PORTA CATH INSERTION N/A 09/04/2018   Procedure: PORTA CATH INSERTION;  Surgeon: DAlgernon Huxley MD;  [facility];  Service: Cardiovascular;  Laterality: N/A;  . TEE WITHOUT CARDIOVERSION N/A 09/16/2018   Procedure: TRANSESOPHAGEAL ECHOCARDIOGRAM (TEE);  Surgeon: ENelva Bush MD;  Location: [facility] ORS;  Service: Cardiovascular;  Laterality: N/A;  . TONSILLECTOMY    . VULVA SURGERY      Social History   Socioeconomic History  . Marital status: Widowed    Spouse name: Not on file  . Number of children: 2  . Years of education: Not on file  . Highest education level: Not on file  Occupational History  . Not on file  Social Needs  . Financial resource strain: Not on file  . Food insecurity:    Worry: Not on file    Inability: Not on file  . Transportation needs:    Medical: Not on file    Non-medical: Not on file  Tobacco Use  . Smoking status: Former Smoker    Packs/day: 1.00    Years: 20.00    Pack years: 20.00    Types: Cigarettes    Last attempt to quit: 08/29/1993    Years since quitting: 25.1  . Smokeless tobacco: Never Used  Substance and Sexual Activity  . Alcohol use: Not Currently  . Drug use: Never  . Sexual activity: Not Currently  Lifestyle    . Physical activity:    Days per week: Not on file    Minutes per session: Not on file  . Stress: Not on file  Relationships  . Social connections:    Talks on phone: Not on file    Gets together: Not on file    Attends religious service: Not on file    Active member of club or organization: Not on file    Attends meetings of clubs or organizations: Not on file    Relationship status: Not on file  . Intimate partner violence:    Fear of current or ex partner: Not on file    Emotionally abused: Not on file    Physically abused: Not on file    Forced sexual activity: Not on file  Other Topics Concern  . Not on file  Social History Narrative  . Not on file    Family History  Problem Relation Age of Onset  . Alzheimer's disease Mother   . Heart disease Father      Current Outpatient Medications:  .  amLODipine (NORVASC) 2.5 MG tablet, Take 2.5 mg by mouth daily., Disp: , Rfl:  .  atenolol (TENORMIN) 100 MG tablet, Take 100 mg by mouth daily., Disp: , Rfl:  .  benazepril (LOTENSIN) 40 MG tablet, Take 40 mg by mouth daily., Disp: , Rfl:  .  dexamethasone (DECADRON) 4 MG tablet, Take 2 tablets (8 mg total) by mouth daily. Start the day after chemotherapy for 2 days., Disp: 30 tablet, Rfl: 1 .  doxazosin (CARDURA) 8 MG tablet, Take 8 mg by mouth daily., Disp: , Rfl:  .  fentaNYL (DURAGESIC - DOSED MCG/HR) 25 MCG/HR patch, Place 1 patch (25 mcg total) onto the skin every 3 (three) days., Disp: 10 patch, Rfl: 0 .  lidocaine-prilocaine (EMLA) cream, Apply to affected area once, Disp: 30 g, Rfl: 3 .  ondansetron (ZOFRAN) 8 MG tablet, Take 1 tablet (8 mg total) by mouth 2 (two) times daily as needed for refractory nausea / vomiting. Start on day 3 after chemo., Disp: 30 tablet, Rfl: 1 .  oxyCODONE (ROXICODONE) 5 MG immediate release tablet, Take 1 tablet (5 mg total) by mouth every 4 (four) hours as needed for moderate pain or severe pain., Disp: 30 tablet, Rfl: 0 .  pravastatin  (PRAVACHOL) 40 MG tablet, Take 40 mg by mouth daily., Disp: , Rfl:  .  prochlorperazine (COMPAZINE) 10 MG tablet, Take 1 tablet (10 mg total) by mouth every 6 (six) hours as needed (Nausea or vomiting)., Disp: 30 tablet, Rfl: 1 .  senna (SENOKOT) 8.6 MG TABS tablet, Take 2 tablets (17.2 mg total) by mouth daily., Disp: 120 each, Rfl: 0 .  vitamin B-12 1000 MCG  tablet, Take 1 tablet (1,000 mcg total) by mouth daily., Disp: 120 tablet, Rfl: 0 .  warfarin (COUMADIN) 1 MG tablet, Take 3 tablets (3 mg total) by mouth daily., Disp: 60 tablet, Rfl: 0 .  polyethylene glycol (MIRALAX) packet, Take 17 g by mouth daily as needed for mild constipation or moderate constipation. (Patient not taking: Reported on 10/07/2018), Disp: 30 each, Rfl: 0 .  sertraline (ZOLOFT) 25 MG tablet, Take 1 tablet (25 mg total) by mouth daily. (Patient not taking: Reported on 10/03/2018), Disp: 30 tablet, Rfl: 1  Physical exam:  Vitals:   10/07/18 1056  BP: (!) 88/55  Pulse: 69  Resp: 18  Temp: 99.4 F (37.4 C)  TempSrc: Tympanic   Physical Exam  Constitutional: She is oriented to person, place, and time.  Frail, elderly female.  Sitting in wheelchair.  Uncomfortable appearing  HENT:  Head: Atraumatic.  Nose: Nose normal.  Mouth/Throat: Oropharynx is clear and moist. No oropharyngeal exudate.  Eyes: Conjunctivae are normal. No scleral icterus.  Neck: Normal range of motion.  Cardiovascular: Normal rate, regular rhythm and normal heart sounds.  Pulmonary/Chest: Effort normal and breath sounds normal.  Abdominal: Soft. Bowel sounds are normal.  Genitourinary:  Genitourinary Comments: Known ulcerated vulvar mass (exam deferred today)  Musculoskeletal: She exhibits no edema.       Right lower leg: She exhibits no tenderness, no bony tenderness, no swelling, no edema and no deformity.       Left lower leg: She exhibits no tenderness, no bony tenderness, no swelling, no edema and no deformity.  Neurological: She is alert  and oriented to person, place, and time.  Skin: Skin is warm and dry.  Psychiatric: She has a normal mood and affect.      CMP Latest Ref Rng & Units 10/07/2018  Glucose 70 - 99 mg/dL 186(H)  BUN 8 - 23 mg/dL 30(H)  Creatinine 0.44 - 1.00 mg/dL 0.96  Sodium 135 - 145 mmol/L 135  Potassium 3.5 - 5.1 mmol/L 4.5  Chloride 98 - 111 mmol/L 102  CO2 22 - 32 mmol/L 23  Calcium 8.9 - 10.3 mg/dL 8.6(L)  Total Protein 6.5 - 8.1 g/dL 6.2(L)  Total Bilirubin 0.3 - 1.2 mg/dL 1.0  Alkaline Phos 38 - 126 U/L 59  AST 15 - 41 U/L 24  ALT 0 - 44 U/L 13   CBC Latest Ref Rng & Units 10/07/2018  WBC 4.0 - 10.5 K/uL 8.4  Hemoglobin 12.0 - 15.0 g/dL 10.7(L)  Hematocrit 36.0 - 46.0 % 35.0(L)  Platelets 150 - 400 K/uL 217    No images are attached to the encounter.  Dg Chest 1 View  Result Date: 09/13/2018 CLINICAL DATA:  fever and weakness onset 2-3 days ago. Denies CP, SOB or cough at this time. Patient reports recent dx of vulvar ca that was due to begin chemo treatment for yesterday, but was unable to begin because she was too hypotensive. Hx HTN, port-a-cath insertion. Former smoker. EXAM: CHEST  1 VIEW COMPARISON:  none FINDINGS: Lungs are clear. Right IJ power injectable port catheter to the distal SVC. No pneumothorax. Heart size upper limits normal. Retrocardiac double density presumably hiatal hernia. Aortic Atherosclerosis (ICD10-170.0). No effusion. Visualized bones unremarkable. IMPRESSION: No acute cardiopulmonary disease. Electronically Signed   By: Lucrezia Europe M.D.   On: 09/13/2018 09:37   Ct Head Wo Contrast  Result Date: 09/29/2018 CLINICAL DATA:  Syncopal episode, dizziness, weakness, fall and head trauma EXAM: CT HEAD WITHOUT CONTRAST TECHNIQUE: Contiguous  axial images were obtained from the base of the skull through the vertex without intravenous contrast. COMPARISON:  09/15/2018 FINDINGS: Brain: Stable atrophy pattern and chronic white matter microvascular ischemic changes throughout  both cerebral hemispheres. No acute intracranial hemorrhage, new infarction, mass lesion, midline shift, herniation, hydrocephalus, or extra-axial fluid collection. No focal mass effect or edema. Cisterns are patent. Cerebellar atrophy as well. Vascular: Intracranial atherosclerosis.  No hyperdense vessel. Skull: Normal. Negative for fracture or focal lesion. Sinuses/Orbits: No acute orbital finding. Minor right ethmoid mucosal thickening. Other sinuses visualized are clear. Mastoids are clear. Other: None. IMPRESSION: Stable atrophy pattern and chronic white matter microvascular ischemic changes. No acute intracranial abnormality by noncontrast CT. Electronically Signed   By: Jerilynn Mages.  Shick M.D.   On: 09/29/2018 21:08   Ct Head Wo Contrast  Result Date: 09/15/2018 CLINICAL DATA:  77 y/o F; anxiety, agitation, possible personality change. Rule out intracranial process. History of vulvar cancer. EXAM: CT HEAD WITHOUT CONTRAST TECHNIQUE: Contiguous axial images were obtained from the base of the skull through the vertex without intravenous contrast. COMPARISON:  None. FINDINGS: Brain: No evidence of acute infarction, hemorrhage, hydrocephalus, extra-axial collection or mass lesion/mass effect. Nonspecific white matter hypodensities are compatible with mild chronic microvascular ischemic changes. Mild volume loss of the brain for age. Vascular: Calcific atherosclerosis of the carotid siphons. No hyperdense vessel identified. Skull: Normal. Negative for fracture or focal lesion. Sinuses/Orbits: No acute finding. Other: None. IMPRESSION: 1. No acute intracranial abnormality identified. 2. Mild for age chronic microvascular ischemic changes and volume loss of the brain. Electronically Signed   By: Kristine Garbe M.D.   On: 09/15/2018 18:52    Assessment and plan- Patient is a 77 y.o. female diagnosed with vulvar cancer presents to symptom management for nocturnal leg cramps.  1.  Vulvar cancer-recurrent s/p  cycle 1 of carbo-taxol. Overall tolerating well. Hmg decreased, likely related to chemotherapy.  Continue to monitor.  Other counts stable.  2.  Hypomagnesemia-may be contributory to nocturnal leg cramps.  Electrolyte replacement and IV fluids today in clinic.  Would not recommend oral magnesium supplements as this can have diarrhea as side effect which she has at baseline.  Per patient request, list of foods that are rich in magnesium provided.  3.  Nocturnal leg cramps-may be related to hypomagnesemia vs others. Start gabapentin 317m qhs. If tolerating well in 2 weeks with uptitrate to 600 mg at night.  Could consider daytime dosing if daytime symptoms.  Follow-up with Dr. YTasia Catchingsas scheduled. Patient advised to notify the clinic if there is no improvement in symptoms or if symptoms worsen in next 3-4 days.   Visit Diagnosis 1. [place] cancer ([place]   2. Hypomagnesemia   3. Nocturnal leg cramps     Patient expressed understanding and was in agreement with this plan. She also understands that She can call clinic at any time with any questions, concerns, or complaints.   Thank you for allowing me to participate in the care of this very pleasant patient.   LBeckey Rutter DNP, AGNP-C [phone](work cell) [phone](office)

## 2018-10-07 NOTE — Patient Instructions (Signed)
Mrs. Belote,  I have sent a prescription for gabapentin to your pharmacy. Please take 1 capsule at night for leg cramps. You received IV fluids, pain medication, and magnesium today in clinic. Please do not drive for 24 hours due to pain medication. You can increase your intake of magnesium rich foods such as dark chocolate, avocados, nuts (almonds, cashews), beans and peas. You can also try an over the counter supplement such as slow-mag (slow release magnesium). Please let me know if you have any questions. It was a pleasure meeting you today and thank you for allowing me to participate in your care. -Beckey Rutter, NP

## 2018-10-07 NOTE — Telephone Encounter (Signed)
Noted  

## 2018-10-07 NOTE — Telephone Encounter (Signed)
Daughter called reporting that patient had chemotherapy Friday and that she is having extreme pain in both legs since 330 this morning , but has been having intermittent pain all week. Appointment accepted after discussing with Alease Medina, NP

## 2018-10-10 ENCOUNTER — Other Ambulatory Visit: Payer: Self-pay

## 2018-10-10 ENCOUNTER — Encounter: Payer: Self-pay | Admitting: Oncology

## 2018-10-10 ENCOUNTER — Inpatient Hospital Stay (HOSPITAL_BASED_OUTPATIENT_CLINIC_OR_DEPARTMENT_OTHER): Payer: Medicare Other | Admitting: Hospice and Palliative Medicine

## 2018-10-10 ENCOUNTER — Inpatient Hospital Stay: Payer: Medicare Other

## 2018-10-10 ENCOUNTER — Telehealth: Payer: Self-pay | Admitting: Hematology and Oncology

## 2018-10-10 ENCOUNTER — Inpatient Hospital Stay (HOSPITAL_BASED_OUTPATIENT_CLINIC_OR_DEPARTMENT_OTHER): Payer: Medicare Other | Admitting: Oncology

## 2018-10-10 ENCOUNTER — Ambulatory Visit: Payer: Medicare Other | Admitting: Hospice and Palliative Medicine

## 2018-10-10 ENCOUNTER — Other Ambulatory Visit: Payer: Self-pay | Admitting: Hematology and Oncology

## 2018-10-10 VITALS — BP 99/62 | HR 56 | Temp 96.7°F | Wt 110.0 lb

## 2018-10-10 DIAGNOSIS — C519 Malignant neoplasm of vulva, unspecified: Secondary | ICD-10-CM

## 2018-10-10 DIAGNOSIS — Z79899 Other long term (current) drug therapy: Secondary | ICD-10-CM

## 2018-10-10 DIAGNOSIS — I9589 Other hypotension: Secondary | ICD-10-CM

## 2018-10-10 DIAGNOSIS — D519 Vitamin B12 deficiency anemia, unspecified: Secondary | ICD-10-CM

## 2018-10-10 DIAGNOSIS — Z87891 Personal history of nicotine dependence: Secondary | ICD-10-CM

## 2018-10-10 DIAGNOSIS — Z9079 Acquired absence of other genital organ(s): Secondary | ICD-10-CM

## 2018-10-10 DIAGNOSIS — E860 Dehydration: Secondary | ICD-10-CM

## 2018-10-10 DIAGNOSIS — Z923 Personal history of irradiation: Secondary | ICD-10-CM | POA: Diagnosis not present

## 2018-10-10 DIAGNOSIS — Z515 Encounter for palliative care: Secondary | ICD-10-CM | POA: Diagnosis not present

## 2018-10-10 DIAGNOSIS — G893 Neoplasm related pain (acute) (chronic): Secondary | ICD-10-CM

## 2018-10-10 DIAGNOSIS — F419 Anxiety disorder, unspecified: Secondary | ICD-10-CM

## 2018-10-10 DIAGNOSIS — R5383 Other fatigue: Secondary | ICD-10-CM

## 2018-10-10 DIAGNOSIS — E861 Hypovolemia: Secondary | ICD-10-CM

## 2018-10-10 DIAGNOSIS — I10 Essential (primary) hypertension: Secondary | ICD-10-CM

## 2018-10-10 DIAGNOSIS — R197 Diarrhea, unspecified: Secondary | ICD-10-CM

## 2018-10-10 DIAGNOSIS — E531 Pyridoxine deficiency: Secondary | ICD-10-CM

## 2018-10-10 DIAGNOSIS — Z95828 Presence of other vascular implants and grafts: Secondary | ICD-10-CM

## 2018-10-10 DIAGNOSIS — I959 Hypotension, unspecified: Secondary | ICD-10-CM

## 2018-10-10 DIAGNOSIS — R530 Neoplastic (malignant) related fatigue: Secondary | ICD-10-CM

## 2018-10-10 DIAGNOSIS — R7881 Bacteremia: Secondary | ICD-10-CM

## 2018-10-10 DIAGNOSIS — Z7901 Long term (current) use of anticoagulants: Secondary | ICD-10-CM

## 2018-10-10 DIAGNOSIS — I480 Paroxysmal atrial fibrillation: Secondary | ICD-10-CM

## 2018-10-10 DIAGNOSIS — E785 Hyperlipidemia, unspecified: Secondary | ICD-10-CM

## 2018-10-10 DIAGNOSIS — R5381 Other malaise: Secondary | ICD-10-CM

## 2018-10-10 LAB — CBC WITH DIFFERENTIAL/PLATELET
Abs Immature Granulocytes: 0.07 10*3/uL (ref 0.00–0.07)
BASOS ABS: 0 10*3/uL (ref 0.0–0.1)
Basophils Relative: 1 %
EOS PCT: 0 %
Eosinophils Absolute: 0 10*3/uL (ref 0.0–0.5)
HEMATOCRIT: 32 % — AB (ref 36.0–46.0)
Hemoglobin: 9.9 g/dL — ABNORMAL LOW (ref 12.0–15.0)
Immature Granulocytes: 1 %
LYMPHS PCT: 6 %
Lymphs Abs: 0.5 10*3/uL — ABNORMAL LOW (ref 0.7–4.0)
MCH: 26 pg (ref 26.0–34.0)
MCHC: 30.9 g/dL (ref 30.0–36.0)
MCV: 84 fL (ref 80.0–100.0)
MONO ABS: 0.5 10*3/uL (ref 0.1–1.0)
MONOS PCT: 6 %
Neutro Abs: 7 10*3/uL (ref 1.7–7.7)
Neutrophils Relative %: 86 %
Platelets: 219 10*3/uL (ref 150–400)
RBC: 3.81 MIL/uL — ABNORMAL LOW (ref 3.87–5.11)
RDW: 16.1 % — AB (ref 11.5–15.5)
SMEAR REVIEW: NORMAL
WBC: 8.1 10*3/uL (ref 4.0–10.5)
nRBC: 0 % (ref 0.0–0.2)

## 2018-10-10 LAB — COMPREHENSIVE METABOLIC PANEL
ALK PHOS: 61 U/L (ref 38–126)
ALT: 12 U/L (ref 0–44)
AST: 18 U/L (ref 15–41)
Albumin: 2.6 g/dL — ABNORMAL LOW (ref 3.5–5.0)
Anion gap: 7 (ref 5–15)
BUN: 47 mg/dL — AB (ref 8–23)
CHLORIDE: 105 mmol/L (ref 98–111)
CO2: 24 mmol/L (ref 22–32)
CREATININE: 0.95 mg/dL (ref 0.44–1.00)
Calcium: 8.9 mg/dL (ref 8.9–10.3)
GFR calc Af Amer: >60 mL/min (ref >60)
GFR calc non Af Amer: 57 mL/min — ABNORMAL LOW (ref >60)
GLUCOSE: 158 mg/dL — AB (ref 70–99)
Potassium: 5 mmol/L (ref 3.5–5.1)
SODIUM: 136 mmol/L (ref 135–145)
Total Bilirubin: 0.4 mg/dL (ref 0.3–1.2)
Total Protein: 5.9 g/dL — ABNORMAL LOW (ref 6.5–8.1)

## 2018-10-10 LAB — C DIFFICILE QUICK SCREEN W PCR REFLEX
C DIFFICILE (CDIFF) TOXIN: POSITIVE — AB
C Diff antigen: POSITIVE — AB
C Diff interpretation: DETECTED

## 2018-10-10 MED ORDER — VANCOMYCIN HCL 125 MG PO CAPS: 125.0000 mg | ORAL_CAPSULE | Freq: Four times a day (QID) | ORAL | 0 refills | Status: DC

## 2018-10-10 MED ORDER — HEPARIN SOD (PORK) LOCK FLUSH 100 UNIT/ML IV SOLN
500.0000 [IU] | Freq: Once | INTRAVENOUS | Status: AC
  Administered 2018-10-10: 500 [IU] via INTRAVENOUS

## 2018-10-10 MED ORDER — SODIUM CHLORIDE 0.9% FLUSH
10.0000 mL | INTRAVENOUS | Status: DC | PRN
  Administered 2018-10-10: 10 mL via INTRAVENOUS
  Filled 2018-10-10: qty 10

## 2018-10-10 MED ORDER — SODIUM CHLORIDE 0.9 % IV SOLN
Freq: Once | INTRAVENOUS | Status: AC
  Administered 2018-10-10: 14:00:00 via INTRAVENOUS
  Filled 2018-10-10: qty 250

## 2018-10-10 MED ORDER — LOPERAMIDE HCL 2 MG PO CAPS: 2.0000 mg | ORAL_CAPSULE | ORAL | 1 refills | Status: DC

## 2018-10-10 MED ORDER — MORPHINE SULFATE (PF) 2 MG/ML IV SOLN
2.0000 mg | Freq: Once | INTRAVENOUS | Status: AC
  Administered 2018-10-10: 2 mg via INTRAVENOUS
  Filled 2018-10-10: qty 1

## 2018-10-10 NOTE — Telephone Encounter (Signed)
Re: C difficile  Spoke to patient's daughter regarding stool + C difficile.    Rx for oral vancomycin 125 mg po 4x/day for 10 days sent to [facility] in [place].  Patient instructed not to use Imodium.  Hygiene reinforced as diarrhea is contagious.   Lequita Asal, MD

## 2018-10-10 NOTE — Progress Notes (Signed)
[facility]  [phone] [phone]   Name: Virginia Murray Date: 10/10/2018 MRN: [id number]  DOB: [birth year]  Patient Care Team: Pleas Koch, NP as PCP - General (Internal Medicine) Clent Jacks, RN as Registered Nurse Earlie Server, MD as Medical Oncologist (Medical Oncology) Gillis Ends, MD as Consulting Physician (Gynecologic Oncology)    REASON FOR CONSULTATION: Palliative Care consult requested for this 77 y.o. 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  Patient is being treated on carboplatin with Taxol.  PMH also notable for paroxysmal A. fib anticoagulated on warfarin, hypertension, hyperlipidemia, and depression.  She was hospitalized 09/13/2018 to 09/17/2018 with sepsis found to have blood cultures positive for group G Streptococcus.  Palliative care was asked to help provide patient support through treatment.   SOCIAL HISTORY:    Patient is widowed, having lost her husband May 2019.  She moved here from [place] to live with her son.  She also has a daughter who lives in [place].  ADVANCE DIRECTIVES:  None  CODE STATUS: Full code  PAST MEDICAL HISTORY: Past Medical History:  Diagnosis Date  . A-fib ([place])   . Cancer ([facility])    vulva   . Depression   . Diverticulitis   . Hyperlipemia   . Hypertension   . Vulva cancer ([place])     PAST SURGICAL HISTORY:  Past Surgical History:  Procedure Laterality Date  . ABDOMINAL HYSTERECTOMY    . CATARACT EXTRACTION, BILATERAL    . KNEE SURGERY     right knee   . PORTA CATH INSERTION N/A 09/04/2018   Procedure: PORTA CATH INSERTION;  Surgeon: Algernon Huxley, MD;  Location: [facility];  Service: Cardiovascular;  Laterality: N/A;  . TEE WITHOUT CARDIOVERSION N/A 09/16/2018   Procedure: TRANSESOPHAGEAL  ECHOCARDIOGRAM (TEE);  Surgeon: Nelva Bush, MD;  Location: [facility];  Service: Cardiovascular;  Laterality: N/A;  . TONSILLECTOMY    . VULVA SURGERY      HEMATOLOGY/ONCOLOGY HISTORY:    Vulvar cancer ([place])   09/03/2018 Initial Diagnosis    Vulvar cancer ([place])    09/03/2018 -  Chemotherapy    The patient had palonosetron (ALOXI) injection 0.25 mg, 0.25 mg, Intravenous,  Once, 1 of 5 cycles Administration: 0.25 mg (10/03/2018) CARBOplatin (PARAPLATIN) 250 mg in sodium chloride 0.9 % 100 mL chemo infusion, 250 mg (100 % of original dose 254 mg), Intravenous,  Once, 1 of 5 cycles Dose modification:   (original dose 254 mg, Cycle 1, Reason: Provider Judgment),   (original dose 254 mg, Cycle 1) Administration: 250 mg (10/03/2018) PACLitaxel (TAXOL) 198 mg in sodium chloride 0.9 % 250 mL chemo infusion (> 92m/m2), 135 mg/m2 = 198 mg (100 % of original dose 135 mg/m2), Intravenous,  Once, 1 of 5 cycles Dose modification: 135 mg/m2 (original dose 135 mg/m2, Cycle 1, Reason: Other (see comments), Comment: performance status) Administration: 198 mg (10/03/2018)  for chemotherapy treatment.      ALLERGIES:  has No Known Allergies.  MEDICATIONS:  Current Outpatient Medications  Medication Sig Dispense Refill  . amLODipine (NORVASC) 2.5 MG tablet Take 2.5 mg by mouth daily.    .Marland Kitchenatenolol (TENORMIN) 100 MG tablet Take 100 mg by mouth daily.    . benazepril (LOTENSIN) 40 MG tablet Take 40 mg by mouth daily.    .Marland Kitchendexamethasone (DECADRON) 4  MG tablet Take 2 tablets (8 mg total) by mouth daily. Start the day after chemotherapy for 2 days. 30 tablet 1  . doxazosin (CARDURA) 8 MG tablet Take 8 mg by mouth daily.    . fentaNYL (DURAGESIC - DOSED MCG/HR) 25 MCG/HR patch Place 1 patch (25 mcg total) onto the skin every 3 (three) days. 10 patch 0  . gabapentin (NEURONTIN) 300 MG capsule Take 1 capsule (300 mg total) by mouth at bedtime. For leg cramps 30 capsule 2  . lidocaine-prilocaine (EMLA) cream  Apply to affected area once 30 g 3  . ondansetron (ZOFRAN) 8 MG tablet Take 1 tablet (8 mg total) by mouth 2 (two) times daily as needed for refractory nausea / vomiting. Start on day 3 after chemo. 30 tablet 1  . oxyCODONE (ROXICODONE) 5 MG immediate release tablet Take 1 tablet (5 mg total) by mouth every 4 (four) hours as needed for moderate pain or severe pain. 30 tablet 0  . polyethylene glycol (MIRALAX) packet Take 17 g by mouth daily as needed for mild constipation or moderate constipation. (Patient not taking: Reported on 10/10/2018) 30 each 0  . pravastatin (PRAVACHOL) 40 MG tablet Take 40 mg by mouth daily.    . prochlorperazine (COMPAZINE) 10 MG tablet Take 1 tablet (10 mg total) by mouth every 6 (six) hours as needed (Nausea or vomiting). 30 tablet 1  . senna (SENOKOT) 8.6 MG TABS tablet Take 2 tablets (17.2 mg total) by mouth daily. (Patient not taking: Reported on 10/10/2018) 120 each 0  . sertraline (ZOLOFT) 25 MG tablet Take 1 tablet (25 mg total) by mouth daily. (Patient not taking: Reported on 10/10/2018) 30 tablet 1  . vitamin B-12 1000 MCG tablet Take 1 tablet (1,000 mcg total) by mouth daily. 120 tablet 0  . warfarin (COUMADIN) 1 MG tablet Take 3 tablets (3 mg total) by mouth daily. 60 tablet 0   No current facility-administered medications for this visit.     VITAL SIGNS: There were no vitals taken for this visit. There were no vitals filed for this visit.  Estimated body mass index is 19.49 kg/m as calculated from the following:   Height as of 09/29/18: 5' 3"  (1.6 m).   Weight as of an earlier encounter on 10/10/18: 110 lb (49.9 kg).  LABS: CBC:    Component Value Date/Time   WBC 8.1 10/10/2018 1217   HGB 9.9 (L) 10/10/2018 1217   HCT 32.0 (L) 10/10/2018 1217   PLT 219 10/10/2018 1217   MCV 84.0 10/10/2018 1217   NEUTROABS 7.0 10/10/2018 1217   LYMPHSABS 0.5 (L) 10/10/2018 1217   MONOABS 0.5 10/10/2018 1217   EOSABS 0.0 10/10/2018 1217   BASOSABS 0.0  10/10/2018 1217   Comprehensive Metabolic Panel:    Component Value Date/Time   NA 136 10/10/2018 1217   K 5.0 10/10/2018 1217   CL 105 10/10/2018 1217   CO2 24 10/10/2018 1217   BUN 47 (H) 10/10/2018 1217   CREATININE 0.95 10/10/2018 1217   GLUCOSE 158 (H) 10/10/2018 1217   CALCIUM 8.9 10/10/2018 1217   AST 18 10/10/2018 1217   ALT 12 10/10/2018 1217   ALKPHOS 61 10/10/2018 1217   BILITOT 0.4 10/10/2018 1217   PROT 5.9 (L) 10/10/2018 1217   ALBUMIN 2.6 (L) 10/10/2018 1217    RADIOGRAPHIC STUDIES: Dg Chest 1 View  Result Date: 09/13/2018 CLINICAL DATA:  fever and weakness onset 2-3 days ago. Denies CP, SOB or cough at this time. Patient reports  recent dx of vulvar ca that was due to begin chemo treatment for yesterday, but was unable to begin because she was too hypotensive. Hx HTN, port-a-cath insertion. Former smoker. EXAM: CHEST  1 VIEW COMPARISON:  none FINDINGS: Lungs are clear. Right IJ power injectable port catheter to the distal SVC. No pneumothorax. Heart size upper limits normal. Retrocardiac double density presumably hiatal hernia. Aortic Atherosclerosis (ICD10-170.0). No effusion. Visualized bones unremarkable. IMPRESSION: No acute cardiopulmonary disease. Electronically Signed   By: Lucrezia Europe M.D.   On: 09/13/2018 09:37   Ct Head Wo Contrast  Result Date: 09/29/2018 CLINICAL DATA:  Syncopal episode, dizziness, weakness, fall and head trauma EXAM: CT HEAD WITHOUT CONTRAST TECHNIQUE: Contiguous axial images were obtained from the base of the skull through the vertex without intravenous contrast. COMPARISON:  09/15/2018 FINDINGS: Brain: Stable atrophy pattern and chronic white matter microvascular ischemic changes throughout both cerebral hemispheres. No acute intracranial hemorrhage, new infarction, mass lesion, midline shift, herniation, hydrocephalus, or extra-axial fluid collection. No focal mass effect or edema. Cisterns are patent. Cerebellar atrophy as well. Vascular:  Intracranial atherosclerosis.  No hyperdense vessel. Skull: Normal. Negative for fracture or focal lesion. Sinuses/Orbits: No acute orbital finding. Minor right ethmoid mucosal thickening. Other sinuses visualized are clear. Mastoids are clear. Other: None. IMPRESSION: Stable atrophy pattern and chronic white matter microvascular ischemic changes. No acute intracranial abnormality by noncontrast CT. Electronically Signed   By: Jerilynn Mages.  Shick M.D.   On: 09/29/2018 21:08   Ct Head Wo Contrast  Result Date: 09/15/2018 CLINICAL DATA:  77 y/o F; anxiety, agitation, possible personality change. Rule out intracranial process. History of vulvar cancer. EXAM: CT HEAD WITHOUT CONTRAST TECHNIQUE: Contiguous axial images were obtained from the base of the skull through the vertex without intravenous contrast. COMPARISON:  None. FINDINGS: Brain: No evidence of acute infarction, hemorrhage, hydrocephalus, extra-axial collection or mass lesion/mass effect. Nonspecific white matter hypodensities are compatible with mild chronic microvascular ischemic changes. Mild volume loss of the brain for age. Vascular: Calcific atherosclerosis of the carotid siphons. No hyperdense vessel identified. Skull: Normal. Negative for fracture or focal lesion. Sinuses/Orbits: No acute finding. Other: None. IMPRESSION: 1. No acute intracranial abnormality identified. 2. Mild for age chronic microvascular ischemic changes and volume loss of the brain. Electronically Signed   By: Kristine Garbe M.D.   On: 09/15/2018 18:52    PERFORMANCE STATUS (ECOG) : 2 - Symptomatic, <50% confined to bed  Review of Systems As noted above. Otherwise, a complete review of systems is negative.  Physical Exam General: NAD, frail appearing, thin Cardiovascular: regular rate and rhythm Pulmonary: clear ant fields Abdomen: soft, nontender, + bowel sounds Extremities: Trace pedal edema Skin: no rashes Neurological: Weakness but otherwise  nonfocal  IMPRESSION: I met today with patient in the clinic.  Patient says she generally does not feel well although she does not readily elucidate on specifics.  She does have some generalized pain and requests analgesia today in the clinic.  Otherwise, she says her pain is reasonably controlled with use of transdermal fentanyl and PRN oxycodone.  She is currently receiving IV fluids.  We will give her a dose of IV morphine.  Patient has some diarrhea.  She was instructed to try PRN Imodium.  I introduced palliative care services in the cancer center and attempted to build rapport.  Patient became tearful when discussing her illness and the recent passing of her husband.  Emotional support provided.  Patient appears to have some depression although she says she  recently had an adverse effect on an antidepressant and will not take those again.  Ultimately, patient said that she was tired and would like to continue our conversation another day.  We will meet with her again the next time she is here to see Dr. Tasia Catchings.   PLAN: Imodium PRN for diarrhea Will give morphine 29m IV x 1 IV fluids today Will continue conversation during next visit regarding goals   Patient expressed understanding and was in agreement with this plan. She also understands that She can call clinic at any time with any questions, concerns, or complaints.    Time Total: 20 minutes  Visit consisted of counseling and education dealing with the complex and emotionally intense issues of symptom management and palliative care in the setting of serious and potentially life-threatening illness.Greater than 50%  of this time was spent counseling and coordinating care related to the above assessment and plan.  Signed by: JAltha Harm DGroveland NP-C, [facility] [place](Work Cell)

## 2018-10-10 NOTE — Progress Notes (Signed)
Patient here today for follow up.  Patient c/o diarrhea

## 2018-10-10 NOTE — Progress Notes (Signed)
Hematology/Oncology follow up  note [facility] Telephone:([phone]) [phone] [phone]) [phone]   Patient Care Team: Pleas Koch, NP as PCP - General (Internal Medicine) Clent Jacks, RN as Registered Nurse Earlie Server, MD as Medical Oncologist (Medical Oncology) Gillis Ends, MD as Consulting Physician (Gynecologic Oncology)  REFERRING PROVIDER: [facility].  REASON FOR VISIT Follow up for treatment of vulvar cancer  HISTORY OF PRESENTING ILLNESS:  Virginia Murray is a  77 y.o.  female with PMH listed below who was referred to me for evaluation of vulvar cancer.  Patient recently relocated from [place] to [place]. Reviewed medical records sent to [place] from [place] cancer in the blood specialist Dr. Marney Doctor, who is patient's previous oncologist. 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  She completed radiation to vulva and the right groin in August 2007 at [facility].  Since then she was on surveillance.  She was advised to have follow-up but she had to postpone as she was taking care of her husband was quite ill and unfortunately passed away On 18-Aug-2018, patient was found to have a 7 cm ulcerated lesion in the left vulva partially involving the vaginal and rectum on examination.  The lesion was biopsied by Dr. Tessa Lerner on 07/29/2018, PET scan showed ulcerated perineal mass 8.7 x 4.2 x 5.3 centimeters, SUV 12, extending into the region of gluteal cleft.  Left inguinal lymph nodes measured [1.1 x 0.7 cm]  SUV 1.6.  Which appeared nonspecific possibly metastatic or reactive. There was no evidence of distant metastasis. Given prior radiation treatment, she was advised by Dr. Tessa Lerner for chemotherapy.  She met Dr.Christi Maudie Mercury for discussion of treatment options.  She  was recommended to start dose reduced carboplatin and Taxol.  Also NexGen sequencing PDL 1 testing was discussed, unknown if testing were sent.  Patient did not start treatment as she is relocating to [place] to live with patient's son and wants to establish care with [facility] cancer center.  Today patient reports moderate discomfort and pain in the vulvar area.  She also has some vaginal discharge and wear pads. Reports bleeding from her private area.  Denies any symptoms of voiding trouble, dysuria or incontinence. Reports constipation.  Past medical history includes paroxysmal atrial fibrillation and take coumadin 74m daily. She has not established care with PCP locally. No INR has been checked recently.   [facility] [place] [place] BUN 21.3, creatinine 1.81, sodium 143, potassium 4, chloride 103, calcium 9.4, total protein 6.9, albumin 3.4, alkaline phosphatase 99 total bili 8.3, ALT 13 AST 18, iron 36, TIBC 241, iron saturation 14.9, magnesium 1.5, phosphorus 3.3, LDH 166, uric acid 5.8.  Folate 18.9, ferritin 84, B12 362, CEA 1.6. 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 The specimen biopsy only represents the superficial part of the lesion and focal invasion cannot be excluded. We have not received pathology slides to be evaluated by our pathologist. PET scan 07/29/2018 showed ulcerated perineal mass with known recurrent vulvar carcinoma.  Involving the left greater than right with posterior extension into the region of gluteal cleft approximately 8.7 x 4.2 x 5.3 cm.  SUV 12.  There is also minimally FDG avid subcentimeter left inguinal lymph nodes, nonspecific.  Metastatic versus reactive. We also received gynecology oncology operative  note in September 2014.  Patient had vulvar biopsy x3 and a perirectal biopsy x1 at that time.  Pathology results not available to [place].   # Discussed with patient and her granddaughter who accompanied patient to today's visit that patient has disease recurrence with involvement of the vaginal and the rectum.  Discussed with GYN oncology Dr. Theora Gianotti.  Surgery is not an option due to her poor performance status.  Exact duration will be palliative only. We agree with the plan for chemotherapy with dose reduced carboplatin and Taxol. Avastin is not a good option for her given bowel involvement and a high risk for fistulization.  # It is unknown whether PDL 1 marker and [place] status was being sent from previous oncologist office or not. We are is doing a process of obtaining patient's pathology slides and blocks.  If it has not been sent before, we plan to send above testing.  # Most recent pathology testing showed at least squamous cell carcinoma in situ, invasive component cannot be ruled out.  This may be secondary to a suboptimal biopsy.  Given patient's poor performance and severe symptoms, I do not think a repeat biopsy is going to add any value to current management. Dr.Secord aware about pathology and also agrees that proceeding with chemotherapy without re-biopsy is reasonable.  Patient also agreed to proceed with chemotherapy treatments based on pathology diagnosis from outside pathologist. The diagnosis and care plan were discussed with patient in detail.    INTERVAL HISTORY [name] is a 77 y.o. female who has above history reviewed by me today presents for assessment for tolerability after cycle 1 chemotherapy.  #Patient reports tolerating chemotherapy well.  Denies any nausea vomiting.  No fever with this. #Reports diarrhea, which happened few days after the chemotherapy.  She cannot specify how many episodes she has during the day as she wears diapers.  Every time she changed diapers,  she sees loose stool.Marland Kitchen  #Neoplasm related pain, she reports pain is well controlled with current regimen with fentanyl patch and oxycodone 10 mg as needed. She asked if I can refill her pain medication as she is being dropping her fentanyl patch on the floor and she is almost out of it. # Recently hospitalization due to Group G Strep Bacteremia.  She has finished 2 weeks of IV Ceftriaxone.  Denies any fever or chills. # Chronic anticoagulation on Coumadin.  Follows up with primary care physician.  Denies any bleeding events.   Review of Systems  Constitutional: Positive for malaise/fatigue. Negative for chills, fever and weight loss.  HENT: Negative for nosebleeds and sore throat.   Eyes: Negative for double vision, photophobia and redness.  Respiratory: Negative for cough, shortness of breath and wheezing.   Cardiovascular: Negative for chest pain, palpitations and orthopnea.  Gastrointestinal: Negative for abdominal pain, blood in stool, constipation, nausea and vomiting.  Genitourinary: Negative for dysuria.       Soreness/pain around Vulva mass, spotting blood.  Musculoskeletal: Negative for back pain, myalgias and neck pain.  Skin: Negative for itching and rash.  Neurological: Negative for dizziness, tingling and tremors.  Endo/Heme/Allergies: Negative for environmental allergies. Does not bruise/bleed easily.  Psychiatric/Behavioral: Negative for depression. The patient is nervous/anxious.     MEDICAL HISTORY:  Past Medical History:  Diagnosis Date  . A-fib ([place])   . Cancer ([place])  vulva   . Depression   . Diverticulitis   . Hyperlipemia   . Hypertension   . Vulva cancer [facility])     SURGICAL HISTORY: Past Surgical History:  Procedure Laterality Date  . ABDOMINAL HYSTERECTOMY    . CATARACT EXTRACTION, BILATERAL    . KNEE SURGERY     right knee   . PORTA CATH INSERTION N/A 09/04/2018   Procedure: PORTA CATH INSERTION;  Surgeon: Algernon Huxley, MD;  Location: [place]  CV LAB;  Service: Cardiovascular;  Laterality: N/A;  . TEE WITHOUT CARDIOVERSION N/A 09/16/2018   Procedure: TRANSESOPHAGEAL ECHOCARDIOGRAM (TEE);  Surgeon: Nelva Bush, MD;  Location: [facility] ORS;  Service: Cardiovascular;  Laterality: N/A;  . TONSILLECTOMY    . VULVA SURGERY      SOCIAL HISTORY: Social History   Socioeconomic History  . Marital status: Widowed    Spouse name: Not on file  . Number of children: 2  . Years of education: Not on file  . Highest education level: Not on file  Occupational History  . Not on file  Social Needs  . Financial resource strain: Not on file  . Food insecurity:    Worry: Not on file    Inability: Not on file  . Transportation needs:    Medical: Not on file    Non-medical: Not on file  Tobacco Use  . Smoking status: Former Smoker    Packs/day: 1.00    Years: 20.00    Pack years: 20.00    Types: Cigarettes    Last attempt to quit: 08/29/1993    Years since quitting: 25.1  . Smokeless tobacco: Never Used  Substance and Sexual Activity  . Alcohol use: Not Currently  . Drug use: Never  . Sexual activity: Not Currently  Lifestyle  . Physical activity:    Days per week: Not on file    Minutes per session: Not on file  . Stress: Not on file  Relationships  . Social connections:    Talks on phone: Not on file    Gets together: Not on file    Attends religious service: Not on file    Active member of club or organization: Not on file    Attends meetings of clubs or organizations: Not on file    Relationship status: Not on file  . Intimate partner violence:    Fear of current or ex partner: Not on file    Emotionally abused: Not on file    Physically abused: Not on file    Forced sexual activity: Not on file  Other Topics Concern  . Not on file  Social History Narrative  . Not on file    FAMILY HISTORY: Family History  Problem Relation Age of Onset  . Alzheimer's disease Mother   . Heart disease Father     ALLERGIES:   has No Known Allergies.  MEDICATIONS:  Current Outpatient Medications  Medication Sig Dispense Refill  . amLODipine (NORVASC) 2.5 MG tablet Take 2.5 mg by mouth daily.    Marland Kitchen atenolol (TENORMIN) 100 MG tablet Take 100 mg by mouth daily.    . benazepril (LOTENSIN) 40 MG tablet Take 40 mg by mouth daily.    Marland Kitchen dexamethasone (DECADRON) 4 MG tablet Take 2 tablets (8 mg total) by mouth daily. Start the day after chemotherapy for 2 days. 30 tablet 1  . doxazosin (CARDURA) 8 MG tablet Take 8 mg by mouth daily.    . fentaNYL (DURAGESIC - DOSED MCG/HR) 25  MCG/HR patch Place 1 patch (25 mcg total) onto the skin every 3 (three) days. 10 patch 0  . gabapentin (NEURONTIN) 300 MG capsule Take 1 capsule (300 mg total) by mouth at bedtime. For leg cramps 30 capsule 2  . lidocaine-prilocaine (EMLA) cream Apply to affected area once 30 g 3  . ondansetron (ZOFRAN) 8 MG tablet Take 1 tablet (8 mg total) by mouth 2 (two) times daily as needed for refractory nausea / vomiting. Start on day 3 after chemo. 30 tablet 1  . oxyCODONE (ROXICODONE) 5 MG immediate release tablet Take 1 tablet (5 mg total) by mouth every 4 (four) hours as needed for moderate pain or severe pain. 30 tablet 0  . pravastatin (PRAVACHOL) 40 MG tablet Take 40 mg by mouth daily.    . prochlorperazine (COMPAZINE) 10 MG tablet Take 1 tablet (10 mg total) by mouth every 6 (six) hours as needed (Nausea or vomiting). 30 tablet 1  . vitamin B-12 1000 MCG tablet Take 1 tablet (1,000 mcg total) by mouth daily. 120 tablet 0  . warfarin (COUMADIN) 1 MG tablet Take 3 tablets (3 mg total) by mouth daily. 60 tablet 0  . polyethylene glycol (MIRALAX) packet Take 17 g by mouth daily as needed for mild constipation or moderate constipation. (Patient not taking: Reported on 10/10/2018) 30 each 0  . senna (SENOKOT) 8.6 MG TABS tablet Take 2 tablets (17.2 mg total) by mouth daily. (Patient not taking: Reported on 10/10/2018) 120 each 0  . sertraline (ZOLOFT) 25 MG  tablet Take 1 tablet (25 mg total) by mouth daily. (Patient not taking: Reported on 10/10/2018) 30 tablet 1   No current facility-administered medications for this visit.    Facility-Administered Medications Ordered in Other Visits  Medication Dose Route Frequency Provider Last Rate Last Dose  . sodium chloride flush (NS) 0.9 % injection 10 mL  10 mL Intravenous PRN Earlie Server, MD   10 mL at 10/10/18 1428     PHYSICAL EXAMINATION: ECOG PERFORMANCE STATUS: 2 - Symptomatic, <50% confined to bed Vitals:   10/10/18 1314  BP: 99/62  Pulse: (!) 56  Temp: (!) 96.7 F (35.9 C)   Filed Weights   10/10/18 1314  Weight: 110 lb (49.9 kg)    Physical Exam  Constitutional: She is oriented to person, place, and time. No distress.  Frail appearance elderly female, mild distress sitting on pillow  HENT:  Head: Normocephalic and atraumatic.  Mouth/Throat: Oropharynx is clear and moist.  Eyes: Pupils are equal, round, and reactive to light. EOM are normal. No scleral icterus.  Neck: Normal range of motion. Neck supple.  Cardiovascular: Normal rate, regular rhythm and normal heart sounds.  Pulmonary/Chest: Effort normal. No respiratory distress. She has no wheezes.  Abdominal: Soft. Bowel sounds are normal. She exhibits no distension and no mass. There is no tenderness.  Genitourinary: Vaginal discharge found.  Genitourinary Comments:  Left vulvar ulcerated mass extending to the vangina,   Musculoskeletal: Normal range of motion. She exhibits no edema or deformity.  Neurological: She is alert and oriented to person, place, and time. No cranial nerve deficit. Coordination normal.  Skin: Skin is warm and dry. No rash noted. No erythema.  Psychiatric: She has a normal mood and affect. Her behavior is normal. Thought content normal.     LABORATORY DATA:  I have reviewed the data as listed Lab Results  Component Value Date   WBC 8.1 10/10/2018   HGB 9.9 (L) 10/10/2018   HCT 32.0 (  L)  10/10/2018   MCV 84.0 10/10/2018   PLT 219 10/10/2018   Recent Labs    10/03/18 0843 10/07/18 1021 10/10/18 1217  NA 138 135 136  K 4.3 4.5 5.0  CL 103 102 105  CO2 '28 23 24  '$ GLUCOSE 166* 186* 158*  BUN 14 30* 47*  CREATININE 0.82 0.96 0.95  CALCIUM 9.1 8.6* 8.9  GFRNONAA >60 57* 57*  GFRAA >60 >60 >60  PROT 6.6 6.2* 5.9*  ALBUMIN 2.7* 2.7* 2.6*  AST '20 24 18  '$ ALT '11 13 12  '$ ALKPHOS 65 59 61  BILITOT 0.7 1.0 0.4   Iron/TIBC/Ferritin/ %Sat    Component Value Date/Time   IRON 19 (L) 09/13/2018 1725   TIBC 196 (L) 09/13/2018 1725   FERRITIN 66 09/13/2018 1725   IRONPCTSAT 10 (L) 09/13/2018 1725     RADIOGRAPHIC STUDIES: I have personally reviewed the radiological images as listed and agreed with the findings in the report. CT head wo contrast 09/29/2018  IMPRESSION: Stable atrophy pattern and chronic white matter microvascular ischemic changes. No acute intracranial abnormality by noncontrast CT.  ASSESSMENT & PLAN:  1. Vulvar cancer ([place])   2. Diarrhea, unspecified type   3. Bacteremia   4. Hypotension due to hypovolemia   5. Anemia due to vitamin B12 deficiency, unspecified B12 deficiency type    # Vulvar cancer, labs reviewed and discussed with patient. Anemia, hemoglobin decreased to 9.9.  Likely secondary to chemotherapy.  Continue to monitor. Other counts stable. #Bacteremia, status post 2 weeks of IV antibiotics.  Afebrile today.  No additional episodes of fever or chills. I have discussed with ID physician and will obtain blood culture through her port today.  We will also order another set of blood culture from peripheral vein. Discussed with RN Katharine Look who points out the policy that blood culture should not be obtained from [facility].  Discussed patient's case with Dr. Reuel Derby and she recommends to follow ID physician's recommendation.  #Diarrhea, sent C. Diff screening.  Advised patient to stop using MiraLAX and senna.  Use Imodium as instructed.  She  voices understanding.  Prescription sent to pharmacy. # Hypotension, Proceed with 1 L of normal saline to improve her blood pressure today.  Also will schedule I liter of IV fluid 3 times a week for the next week. # B12 deficiency, continue oral B12 supplements. Repeat B12 at next visit.   #Neoplasm related pain pain is currently controlled with regimen oxycodone '10mg'$  every 4-6 hours as needed. Fentanyl patch 14mg/h Q72h. Discussed with patient that since pain medication is a controlled substance, I would not refill early for her. She voices understanding.    #Atrial fibrillation on chronic anticoagulation with Coumadin.  She has establish care with primary care physician for INR checking and Coumadin dosing. # Vitamin B12 deficiency, continue vitamin B12 10078m daily.   #Goal of care, palliative intent.  She has been referred to see palliative care nurse practitioner today.  We spent sufficient time to discuss many aspect of care, questions were answered to patient's satisfaction.  All questions were answered. The patient knows to call the clinic with any problems questions or concerns.  Return of visit: 1 week. Total face to face encounter time for this patient visit was 25 min. >50% of the time was  spent in counseling and coordination of care.   ZhEarlie ServerMD, PhD Hematology Oncology [facility]Honor[facility]- [phone]/10/2018

## 2018-10-11 ENCOUNTER — Inpatient Hospital Stay
Admission: EM | Admit: 2018-10-11 | Discharge: 2018-10-23 | DRG: 372 | Disposition: A | Discharge status: Skilled Nursing Facility | Payer: Medicare Other | Attending: Internal Medicine | Admitting: Internal Medicine

## 2018-10-11 ENCOUNTER — Other Ambulatory Visit: Payer: Self-pay

## 2018-10-11 ENCOUNTER — Encounter: Payer: Self-pay | Admitting: Emergency Medicine

## 2018-10-11 DIAGNOSIS — C519 Malignant neoplasm of vulva, unspecified: Secondary | ICD-10-CM | POA: Diagnosis present

## 2018-10-11 DIAGNOSIS — Z515 Encounter for palliative care: Secondary | ICD-10-CM | POA: Diagnosis present

## 2018-10-11 DIAGNOSIS — R451 Restlessness and agitation: Secondary | ICD-10-CM

## 2018-10-11 DIAGNOSIS — E860 Dehydration: Secondary | ICD-10-CM | POA: Diagnosis present

## 2018-10-11 DIAGNOSIS — G4762 Sleep related leg cramps: Secondary | ICD-10-CM | POA: Diagnosis present

## 2018-10-11 DIAGNOSIS — I4819 Other persistent atrial fibrillation: Secondary | ICD-10-CM | POA: Diagnosis present

## 2018-10-11 DIAGNOSIS — D519 Vitamin B12 deficiency anemia, unspecified: Secondary | ICD-10-CM | POA: Diagnosis present

## 2018-10-11 DIAGNOSIS — F329 Major depressive disorder, single episode, unspecified: Secondary | ICD-10-CM | POA: Diagnosis present

## 2018-10-11 DIAGNOSIS — R197 Diarrhea, unspecified: Secondary | ICD-10-CM | POA: Diagnosis present

## 2018-10-11 DIAGNOSIS — R64 Cachexia: Secondary | ICD-10-CM | POA: Diagnosis present

## 2018-10-11 DIAGNOSIS — F411 Generalized anxiety disorder: Secondary | ICD-10-CM | POA: Diagnosis present

## 2018-10-11 DIAGNOSIS — R531 Weakness: Secondary | ICD-10-CM | POA: Diagnosis present

## 2018-10-11 DIAGNOSIS — R791 Abnormal coagulation profile: Secondary | ICD-10-CM

## 2018-10-11 DIAGNOSIS — N179 Acute kidney failure, unspecified: Secondary | ICD-10-CM | POA: Diagnosis present

## 2018-10-11 DIAGNOSIS — R1084 Generalized abdominal pain: Secondary | ICD-10-CM

## 2018-10-11 DIAGNOSIS — F32A Depression, unspecified: Secondary | ICD-10-CM

## 2018-10-11 DIAGNOSIS — Z7189 Other specified counseling: Secondary | ICD-10-CM | POA: Diagnosis not present

## 2018-10-11 DIAGNOSIS — E861 Hypovolemia: Secondary | ICD-10-CM

## 2018-10-11 DIAGNOSIS — R413 Other amnesia: Secondary | ICD-10-CM | POA: Diagnosis present

## 2018-10-11 DIAGNOSIS — E440 Moderate protein-calorie malnutrition: Secondary | ICD-10-CM | POA: Diagnosis present

## 2018-10-11 DIAGNOSIS — I9589 Other hypotension: Secondary | ICD-10-CM

## 2018-10-11 DIAGNOSIS — A09 Infectious gastroenteritis and colitis, unspecified: Secondary | ICD-10-CM

## 2018-10-11 DIAGNOSIS — K5903 Drug induced constipation: Secondary | ICD-10-CM | POA: Diagnosis present

## 2018-10-11 DIAGNOSIS — Z66 Do not resuscitate: Secondary | ICD-10-CM | POA: Diagnosis present

## 2018-10-11 DIAGNOSIS — F4321 Adjustment disorder with depressed mood: Secondary | ICD-10-CM | POA: Diagnosis not present

## 2018-10-11 DIAGNOSIS — I959 Hypotension, unspecified: Secondary | ICD-10-CM | POA: Diagnosis not present

## 2018-10-11 DIAGNOSIS — A0472 Enterocolitis due to Clostridium difficile, not specified as recurrent: Secondary | ICD-10-CM

## 2018-10-11 DIAGNOSIS — T402X5D Adverse effect of other opioids, subsequent encounter: Secondary | ICD-10-CM | POA: Diagnosis not present

## 2018-10-11 DIAGNOSIS — G893 Neoplasm related pain (acute) (chronic): Secondary | ICD-10-CM | POA: Diagnosis present

## 2018-10-11 DIAGNOSIS — G4700 Insomnia, unspecified: Secondary | ICD-10-CM | POA: Diagnosis present

## 2018-10-11 DIAGNOSIS — I480 Paroxysmal atrial fibrillation: Secondary | ICD-10-CM | POA: Diagnosis not present

## 2018-10-11 DIAGNOSIS — I10 Essential (primary) hypertension: Secondary | ICD-10-CM | POA: Diagnosis present

## 2018-10-11 DIAGNOSIS — E785 Hyperlipidemia, unspecified: Secondary | ICD-10-CM | POA: Diagnosis present

## 2018-10-11 LAB — COMPREHENSIVE METABOLIC PANEL
ALBUMIN: 2.2 g/dL — AB (ref 3.5–5.0)
ALT: 11 U/L (ref 0–44)
AST: 16 U/L (ref 15–41)
Alkaline Phosphatase: 54 U/L (ref 38–126)
Anion gap: 5 (ref 5–15)
BUN: 30 mg/dL — AB (ref 8–23)
CO2: 22 mmol/L (ref 22–32)
CREATININE: 0.7 mg/dL (ref 0.44–1.00)
Calcium: 8.1 mg/dL — ABNORMAL LOW (ref 8.9–10.3)
Chloride: 113 mmol/L — ABNORMAL HIGH (ref 98–111)
GFR calc Af Amer: >60 mL/min (ref >60)
GLUCOSE: 97 mg/dL (ref 70–99)
POTASSIUM: 4.6 mmol/L (ref 3.5–5.1)
Sodium: 140 mmol/L (ref 135–145)
Total Bilirubin: 0.5 mg/dL (ref 0.3–1.2)
Total Protein: 5.1 g/dL — ABNORMAL LOW (ref 6.5–8.1)

## 2018-10-11 LAB — CBC
HEMATOCRIT: 30.9 % — AB (ref 36.0–46.0)
HEMOGLOBIN: 9.7 g/dL — AB (ref 12.0–15.0)
MCH: 26 pg (ref 26.0–34.0)
MCHC: 31.4 g/dL (ref 30.0–36.0)
MCV: 82.8 fL (ref 80.0–100.0)
Platelets: 210 10*3/uL (ref 150–400)
RBC: 3.73 MIL/uL — ABNORMAL LOW (ref 3.87–5.11)
RDW: 16 % — AB (ref 11.5–15.5)
WBC: 6.2 10*3/uL (ref 4.0–10.5)
nRBC: 0 % (ref 0.0–0.2)

## 2018-10-11 LAB — LIPASE, BLOOD: Lipase: 17 U/L (ref 11–51)

## 2018-10-11 LAB — PROTIME-INR
INR: 5.97 — AB
Prothrombin Time: 52.9 seconds — ABNORMAL HIGH (ref 11.4–15.2)

## 2018-10-11 MED ORDER — VITAMIN B-12 1000 MCG PO TABS
1000.0000 ug | ORAL_TABLET | Freq: Every day | ORAL | Status: DC
  Administered 2018-10-12 – 2018-10-23 (×12): 1000 ug via ORAL
  Filled 2018-10-11 (×12): qty 1

## 2018-10-11 MED ORDER — FENTANYL 25 MCG/HR TD PT72
25.0000 ug | MEDICATED_PATCH | TRANSDERMAL | Status: DC
  Filled 2018-10-11: qty 1

## 2018-10-11 MED ORDER — ONDANSETRON HCL 4 MG/2ML IJ SOLN: 4.0000 mg | Freq: Four times a day (QID) | INTRAMUSCULAR | Status: DC | PRN

## 2018-10-11 MED ORDER — OXYCODONE HCL 5 MG PO TABS
5.0000 mg | ORAL_TABLET | Freq: Once | ORAL | Status: AC
  Administered 2018-10-11: 5 mg via ORAL
  Filled 2018-10-11: qty 1

## 2018-10-11 MED ORDER — VANCOMYCIN 50 MG/ML ORAL SOLUTION
125.0000 mg | Freq: Four times a day (QID) | ORAL | Status: AC
  Administered 2018-10-11 – 2018-10-21 (×37): 125 mg via ORAL
  Filled 2018-10-11 (×42): qty 2.5

## 2018-10-11 MED ORDER — ONDANSETRON HCL 4 MG PO TABS: 4.0000 mg | ORAL_TABLET | Freq: Four times a day (QID) | ORAL | Status: DC | PRN

## 2018-10-11 MED ORDER — GABAPENTIN 300 MG PO CAPS
300.0000 mg | ORAL_CAPSULE | Freq: Every day | ORAL | Status: DC
  Administered 2018-10-11 – 2018-10-22 (×11): 300 mg via ORAL
  Filled 2018-10-11 (×11): qty 1

## 2018-10-11 MED ORDER — HYDROMORPHONE HCL 1 MG/ML IJ SOLN: 1.0000 mg | Freq: Once | INTRAMUSCULAR | Status: DC

## 2018-10-11 MED ORDER — PRAVASTATIN SODIUM 20 MG PO TABS
40.0000 mg | ORAL_TABLET | Freq: Every day | ORAL | Status: DC
  Administered 2018-10-12 – 2018-10-23 (×11): 40 mg via ORAL
  Filled 2018-10-11 (×12): qty 2

## 2018-10-11 MED ORDER — SODIUM CHLORIDE 0.9 % IV SOLN
INTRAVENOUS | Status: AC
  Administered 2018-10-11 – 2018-10-12 (×3): via INTRAVENOUS

## 2018-10-11 MED ORDER — OXYCODONE HCL 5 MG PO TABS
5.0000 mg | ORAL_TABLET | ORAL | Status: DC | PRN
  Administered 2018-10-13 – 2018-10-19 (×7): 5 mg via ORAL
  Filled 2018-10-11 (×8): qty 1

## 2018-10-11 MED ORDER — ACETAMINOPHEN 650 MG RE SUPP: 650.0000 mg | Freq: Four times a day (QID) | RECTAL | Status: DC | PRN

## 2018-10-11 MED ORDER — PROCHLORPERAZINE MALEATE 10 MG PO TABS
10.0000 mg | ORAL_TABLET | Freq: Four times a day (QID) | ORAL | Status: DC | PRN
  Administered 2018-10-18: 10:00:00 10 mg via ORAL
  Filled 2018-10-11 (×2): qty 1

## 2018-10-11 MED ORDER — ACETAMINOPHEN 325 MG PO TABS
650.0000 mg | ORAL_TABLET | Freq: Four times a day (QID) | ORAL | Status: DC | PRN
  Administered 2018-10-17: 22:00:00 650 mg via ORAL
  Filled 2018-10-11: qty 2

## 2018-10-11 MED ORDER — HYDROMORPHONE HCL 1 MG/ML IJ SOLN
0.5000 mg | INTRAMUSCULAR | Status: DC | PRN
  Administered 2018-10-12 – 2018-10-15 (×5): 0.5 mg via INTRAVENOUS
  Filled 2018-10-11 (×6): qty 1

## 2018-10-11 NOTE — Progress Notes (Signed)
Family Meeting Note  Advance Directive:yes  Today a meeting took place with the Patient.  Patient is able to participate.  The following clinical team members were present during this meeting:MD  The following were discussed:Patient's diagnosis: , Patient's progosis: Unable to determine and Goals for treatment: DNR  Additional follow-up to be provided: follows with outpatient palliative care  Time spent during discussion:20 minutes  Evette Doffing, MD

## 2018-10-11 NOTE — ED Notes (Signed)
Virginia Murray (daughter in law) can be called once pt is ready for discharge.  [phone]

## 2018-10-11 NOTE — H&P (Addendum)
[facility] NAME: Virginia Murray    MR#:  [id number]  DATE OF BIRTH:  [birth year]  DATE OF ADMISSION:  10/11/2018  PRIMARY CARE PHYSICIAN: Pleas Koch, NP   REQUESTING/REFERRING PHYSICIAN: Alvira Philips, MD  CHIEF COMPLAINT:   Chief Complaint  Patient presents with  . Diarrhea  . Vaginal Bleeding    HISTORY OF PRESENT ILLNESS:  Virginia Murray  is a 77 y.o. female with a known history of vulvar cancer, HTN, HLD, and paroxysmal a-fib who presented to the ED with several days of diarrhea and diffuse abdominal pain. She was recently admitted from 9/14-9/18 with GBS bacteremia with vulvar ulcer as suspected source. She received ceftriaxone for a total 2 week course. She then developed multiple episodes of diarrhea per day. Her abdominal pain is diffuse and severe. She denies any fevers, nausea, or vomiting. She endorses chills.  She was seen yesterday in the oncology office. C diff screen was sent and came back as negative. Vancomycin was called into the pharmacy, but patient does not think that she picked this up. Due to her significant diarrhea and weakness, hospitalists were called for admission.  PAST MEDICAL HISTORY:   Past Medical History:  Diagnosis Date  . A-fib ([place])   . Cancer (HCC)    vulva   . Depression   . Diverticulitis   . Hyperlipemia   . Hypertension   . Vulva cancer ([place])     PAST SURGICAL HISTORY:   Past Surgical History:  Procedure Laterality Date  . ABDOMINAL HYSTERECTOMY    . CATARACT EXTRACTION, BILATERAL    . KNEE SURGERY     right knee   . PORTA CATH INSERTION N/A 09/04/2018   Procedure: PORTA CATH INSERTION;  Surgeon: Algernon Huxley, MD;  Location: [facility];  Service: Cardiovascular;  Laterality: N/A;  . TEE WITHOUT CARDIOVERSION N/A 09/16/2018   Procedure: TRANSESOPHAGEAL ECHOCARDIOGRAM (TEE);  Surgeon: Nelva Bush, MD;  Location: [facility] OR[facility];  Service:  Cardiovascular;  Laterality: N/A;  . TONSILLECTOMY    . VULVA SURGERY      SOCIAL HISTORY:   Social History   Tobacco Use  . Smoking status: Former Smoker    Packs/day: 1.00    Years: 20.00    Pack years: 20.00    Types: Cigarettes    Last attempt to quit: 08/29/1993    Years since quitting: 25.1  . Smokeless tobacco: Never Used  Substance Use Topics  . Alcohol use: Not Currently    FAMILY HISTORY:   Family History  Problem Relation Age of Onset  . Alzheimer's disease Mother   . Heart disease Father     DRUG ALLERGIES:  No Known Allergies  REVIEW OF SYSTEMS:   Review of Systems  Constitutional: Positive for chills and malaise/fatigue. Negative for fever.  HENT: Negative for congestion and sore throat.   Eyes: Negative for blurred vision and double vision.  Respiratory: Negative for cough and shortness of breath.   Cardiovascular: Negative for chest pain, palpitations and leg swelling.  Gastrointestinal: Positive for abdominal pain and diarrhea. Negative for blood in stool, melena, nausea and vomiting.  Genitourinary: Negative for dysuria and frequency.  Musculoskeletal: Negative for back pain and neck pain.  Neurological: Positive for weakness. Negative for dizziness and headaches.  Psychiatric/Behavioral: Positive for depression. The patient is nervous/anxious.     MEDICATIONS AT HOME:   Prior to Admission medications   Medication Sig Start Date End Date  Taking? Authorizing Provider  amLODipine (NORVASC) 2.5 MG tablet Take 2.5 mg by mouth daily.    [provider]  atenolol (TENORMIN) 100 MG tablet Take 100 mg by mouth daily.    [provider]  benazepril (LOTENSIN) 40 MG tablet Take 40 mg by mouth daily.    [provider]  dexamethasone (DECADRON) 4 MG tablet Take 2 tablets (8 mg total) by mouth daily. Start the day after chemotherapy for 2 days. 09/03/18   Earlie Server, MD  doxazosin (CARDURA) 8 MG tablet Take 8 mg by mouth daily.     [provider]  fentaNYL (DURAGESIC - DOSED MCG/HR) 25 MCG/HR patch Place 1 patch (25 mcg total) onto the skin every 3 (three) days. 09/30/18   Earlie Server, MD  gabapentin (NEURONTIN) 300 MG capsule Take 1 capsule (300 mg total) by mouth at bedtime. For leg cramps 10/07/18   Verlon Au, NP  lidocaine-prilocaine (EMLA) cream Apply to affected area once 09/03/18   Earlie Server, MD  loperamide (IMODIUM) 2 MG capsule Take 1 capsule (2 mg total) by mouth See admin instructions. take 4mg  followed by 2mg  every 2 hours until diarrhea stops. Maximum: 16 mg/day 10/10/18   Earlie Server, MD  ondansetron (ZOFRAN) 8 MG tablet Take 1 tablet (8 mg total) by mouth 2 (two) times daily as needed for refractory nausea / vomiting. Start on day 3 after chemo. 09/03/18   Earlie Server, MD  oxyCODONE (ROXICODONE) 5 MG immediate release tablet Take 1 tablet (5 mg total) by mouth every 4 (four) hours as needed for moderate pain or severe pain. 09/26/18   Jacquelin Hawking, NP  polyethylene glycol [facility]) packet Take 17 g by mouth daily as needed for mild constipation or moderate constipation. Patient not taking: Reported on 10/10/2018 08/29/18   Earlie Server, MD  pravastatin (PRAVACHOL) 40 MG tablet Take 40 mg by mouth daily.    [provider]  prochlorperazine (COMPAZINE) 10 MG tablet Take 1 tablet (10 mg total) by mouth every 6 (six) hours as needed (Nausea or vomiting). 09/03/18   Earlie Server, MD  senna (SENOKOT) 8.6 MG TABS tablet Take 2 tablets (17.2 mg total) by mouth daily. Patient not taking: Reported on 10/10/2018 08/29/18   Earlie Server, MD  sertraline (ZOLOFT) 25 MG tablet Take 1 tablet (25 mg total) by mouth daily. Patient not taking: Reported on 10/10/2018 09/26/18   Pleas Koch, NP  vancomycin (VANCOCIN) 125 MG capsule Take 1 capsule (125 mg total) by mouth 4 (four) times daily. 10/10/18   Lequita Asal, MD  vitamin B-12 1000 MCG tablet Take 1 tablet (1,000 mcg total) by mouth daily. 09/17/18   Bettey Costa, MD    warfarin (COUMADIN) 1 MG tablet Take 3 tablets (3 mg total) by mouth daily. 09/22/18   Earlie Server, MD      VITAL SIGNS:  Blood pressure (!) 106/47, pulse (!) 58, temperature 98.5 F (36.9 C), temperature source Oral, resp. rate 16, SpO2 92 %.  PHYSICAL EXAMINATION:  Physical Exam  GENERAL:  77 y.o.-year-old patient lying in the bed with no acute distress.  EYES: Pupils equal, round, reactive to light and accommodation. No scleral icterus. Extraocular muscles intact.  HEENT: Head atraumatic, normocephalic. Oropharynx and nasopharynx clear. Dry mucous membranes. NECK:  Supple, no jugular venous distention. No thyroid enlargement, no tenderness.  LUNGS: Normal breath sounds bilaterally, no wheezing, rales,rhonchi or crepitation. No use of accessory muscles of respiration.  CARDIOVASCULAR: Bradycardic, regular rhythm, S1, S2  normal. No murmurs, rubs, or gallops.  ABDOMEN: Soft, nondistended. +moderate generalized tenderness to palpation, no rebound, no guarding. Bowel sounds present. No organomegaly or mass.  EXTREMITIES: No pedal edema, cyanosis, or clubbing.  NEUROLOGIC: Cranial nerves II through XII are intact. +global weakness. Sensation intact. Gait not checked.  PSYCHIATRIC: The patient is alert and oriented x 3. Tearful throughout exam. No SI/HI. SKIN: No obvious rash, lesion, or ulcer.   LABORATORY PANEL:   CBC Recent Labs  Lab 10/11/18 1413  WBC 6.2  HGB 9.7*  HCT 30.9*  PLT 210   ------------------------------------------------------------------------------------------------------------------  Chemistries  Recent Labs  Lab 10/07/18 1021  10/11/18 1413  NA 135   < > 140  K 4.5   < > 4.6  CL 102   < > 113*  CO2 23   < > 22  GLUCOSE 186*   < > 97  BUN 30*   < > 30*  CREATININE 0.96   < > 0.70  CALCIUM 8.6*   < > 8.1*  MG 1.5*  --   --   AST 24   < > 16  ALT 13   < > 11  ALKPHOS 59   < > 54  BILITOT 1.0   < > 0.5   < > = values in this interval not displayed.    ------------------------------------------------------------------------------------------------------------------  Cardiac Enzymes No results for input(s): TROPONINI in the last 168 hours. ------------------------------------------------------------------------------------------------------------------  RADIOLOGY:  No results found.    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 Also hypotensive, but this is chronic for her. 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 Has been receiving fluid boluses at the oncology office - hold home norvac, atenolol, benazepril, and doxazosin - will give a bolus of fluids  Paroxysmal a-fib- in NSR here - coumadin per pharm - holding beta blocker due to hypotension, can restart if needed  Recent GBS bacteremia- hospitalized 9/14-9/18 and received ceftriaxone for 2 weeks via her port. 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 Follows with Dr. Tasia Catchings as outpatient, also follows with outpatient palliative care. - needs outpatient oncology and palliative care follow-up - wound consult for vulvar area  Normocytic anemia/vitamin B12 deficiency- hgb stable and close to baseline - monitor - continue vitamin B12 tablets  Hyperlipidemia- stable - continue home pravastatin  All the records are reviewed and case discussed with ED provider. Management plans discussed with the patient, family and they are in agreement.  CODE STATUS: DNR  TOTAL TIME TAKING CARE OF THIS PATIENT: 45 minutes.    Berna Spare Chrisma Hurlock M.D on 10/11/2018 at 6:08 PM  Between 7am to 6pm -  Pager - [phone]  After 6pm go to [email or web address] - Proofreader  [facility]  Office  [phone]  CC: Primary care physician; Pleas Koch, NP   Note: This dictation was prepared with Dragon dictation along with smaller phrase technology. Any transcriptional errors that result from this process are unintentional.

## 2018-10-11 NOTE — ED Triage Notes (Addendum)
PT to ED via EMS from home with c/o diarrhea and vaginal bleeding , unknown how long, with hx of vaginal cancer and recently dx with c-diff. . Per EMS family states pt is more pale than usual. PT c/o increased pain. VSS. A&Ox4

## 2018-10-11 NOTE — ED Notes (Signed)
Call to Social Work, left a message.

## 2018-10-11 NOTE — Progress Notes (Signed)
Care management consult required not social work LCSW advised ED RN.  Elgie Maziarz LCSW

## 2018-10-11 NOTE — ED Notes (Signed)
Floor called to notify of pt pending arrival; POC shared with pt, transported att

## 2018-10-11 NOTE — ED Notes (Signed)
Pt was incontinent of liquid stool again.  CLeaned as best I could with pt tolerance with spray bottle of water, peri cleaner and dabbing with cleaning wipe.  Dried area and placed pt in a new depend.  Repositioned for comfort and gave pt new warm blankets

## 2018-10-11 NOTE — Consult Note (Signed)
[place] for warfarin dosing Indication: atrial fibrillation  No Known Allergies  Labs: Recent Labs    10/10/18 1217 10/11/18 1413  HGB 9.9* 9.7*  HCT 32.0* 30.9*  PLT 219 210  CREATININE 0.95 0.70    Estimated Creatinine Clearance: 47.9 mL/min (by C-G formula based on SCr of 0.7 mg/dL).   Medical History: Past Medical History:  Diagnosis Date  . A-fib ([place])   . Cancer (HCC)    vulva   . Depression   . Diverticulitis   . Hyperlipemia   . Hypertension   . Vulva cancer [facility])     Assessment: 77 year-old female female on warfarin 3mg  daily PTA  On a recent admission here in September she exhibited the following response:  DATE   INR      Dose   9/14     1.84     3 mg  9/15     2.73     hold  9/16     2.36     2mg  9/17     2.67     2mg    Goal of Therapy:  INR 2-3 Monitor platelets by anticoagulation protocol: Yes   Plan: Her INR is supratherapeutic today. I will order a daily INR and pharmacy will begin warfarin when her INR is closer to goal range  Dallie Piles, PharmD 10/11/2018,6:35 PM

## 2018-10-11 NOTE — ED Provider Notes (Signed)
[facility] Emergency Department Provider Note  ____________________________________________   I have reviewed the triage vital signs and the nursing notes.   HISTORY  Chief Complaint Diarrhea and Vaginal Bleeding   History limited by: Not Limited   HPI Virginia Murray is a 77 y.o. female who presents to the emergency department today because of concerns for continued diarrhea and vaginal bleeding.  Terms of the vaginal bleeding patient has a history of known vulvar cancer.  This is not a new finding for the patient.  Patient is being seen by oncology.  Patient also complaining of diarrhea.  Apparently had testing sent out and was found to had C. difficile.  Was just started on antibiotics today.  In discussion with the family it sound like they are also hopeful to get some additional help with the home to help care for the patient.   Per medical record review patient has a history of vulvar cancer, diverticulitis, c.dif.   Past Medical History:  Diagnosis Date  . A-fib ([place])   . Cancer (HCC)    vulva   . Depression   . Diverticulitis   . Hyperlipemia   . Hypertension   . Vulva cancer [facility])     Patient Active Problem List   Diagnosis Date Noted  . Diarrhea 10/10/2018  . Nocturnal leg cramps 10/07/2018  . Atrial fibrillation ([employer]) 09/26/2018  . Long term (current) use of anticoagulants 09/26/2018  . Hyperlipidemia 09/26/2018  . Constipation due to opioid therapy 09/26/2018  . GAD (generalized anxiety disorder)   . Agitation   . Vulvar cancer ([place]) 09/03/2018  . Goals of care, counseling/discussion 09/03/2018    Past Surgical History:  Procedure Laterality Date  . ABDOMINAL HYSTERECTOMY    . CATARACT EXTRACTION, BILATERAL    . KNEE SURGERY     right knee   . PORTA CATH INSERTION N/A 09/04/2018   Procedure: PORTA CATH INSERTION;  Surgeon: Algernon Huxley, MD;  Location: [place] [facility];  Service: Cardiovascular;  Laterality: N/A;  . TEE  WITHOUT CARDIOVERSION N/A 09/16/2018   Procedure: TRANSESOPHAGEAL ECHOCARDIOGRAM (TEE);  Surgeon: Nelva Bush, MD;  Location: [facility] ORS;  Service: Cardiovascular;  Laterality: N/A;  . TONSILLECTOMY    . VULVA SURGERY      Prior to Admission medications   Medication Sig Start Date End Date Taking? Authorizing Provider  amLODipine (NORVASC) 2.5 MG tablet Take 2.5 mg by mouth daily.    [provider]  atenolol (TENORMIN) 100 MG tablet Take 100 mg by mouth daily.    [provider]  benazepril (LOTENSIN) 40 MG tablet Take 40 mg by mouth daily.    [provider]  dexamethasone (DECADRON) 4 MG tablet Take 2 tablets (8 mg total) by mouth daily. Start the day after chemotherapy for 2 days. 09/03/18   Earlie Server, MD  doxazosin (CARDURA) 8 MG tablet Take 8 mg by mouth daily.    [provider]  fentaNYL (DURAGESIC - DOSED MCG/HR) 25 MCG/HR patch Place 1 patch (25 mcg total) onto the skin every 3 (three) days. 09/30/18   Earlie Server, MD  gabapentin (NEURONTIN) 300 MG capsule Take 1 capsule (300 mg total) by mouth at bedtime. For leg cramps 10/07/18   Verlon Au, NP  lidocaine-prilocaine (EMLA) cream Apply to affected area once 09/03/18   Earlie Server, MD  loperamide (IMODIUM) 2 MG capsule Take 1 capsule (2 mg total) by mouth See admin instructions. take 4mg  followed by 2mg  every 2 hours  until diarrhea stops. Maximum: 16 mg/day 10/10/18   Earlie Server, MD  ondansetron (ZOFRAN) 8 MG tablet Take 1 tablet (8 mg total) by mouth 2 (two) times daily as needed for refractory nausea / vomiting. Start on day 3 after chemo. 09/03/18   Earlie Server, MD  oxyCODONE (ROXICODONE) 5 MG immediate release tablet Take 1 tablet (5 mg total) by mouth every 4 (four) hours as needed for moderate pain or severe pain. 09/26/18   Jacquelin Hawking, NP  polyethylene glycol [facility]) packet Take 17 g by mouth daily as needed for mild constipation or moderate constipation. Patient not taking: Reported on 10/10/2018  08/29/18   Earlie Server, MD  pravastatin (PRAVACHOL) 40 MG tablet Take 40 mg by mouth daily.    [provider]  prochlorperazine (COMPAZINE) 10 MG tablet Take 1 tablet (10 mg total) by mouth every 6 (six) hours as needed (Nausea or vomiting). 09/03/18   Earlie Server, MD  senna (SENOKOT) 8.6 MG TABS tablet Take 2 tablets (17.2 mg total) by mouth daily. Patient not taking: Reported on 10/10/2018 08/29/18   Earlie Server, MD  sertraline (ZOLOFT) 25 MG tablet Take 1 tablet (25 mg total) by mouth daily. Patient not taking: Reported on 10/10/2018 09/26/18   Pleas Koch, NP  vancomycin (VANCOCIN) 125 MG capsule Take 1 capsule (125 mg total) by mouth 4 (four) times daily. 10/10/18   Lequita Asal, MD  vitamin B-12 1000 MCG tablet Take 1 tablet (1,000 mcg total) by mouth daily. 09/17/18   Bettey Costa, MD  warfarin (COUMADIN) 1 MG tablet Take 3 tablets (3 mg total) by mouth daily. 09/22/18   Earlie Server, MD    Allergies Patient has no known allergies.  Family History  Problem Relation Age of Onset  . Alzheimer's disease Mother   . Heart disease Father     Social History Social History   Tobacco Use  . Smoking status: Former Smoker    Packs/day: 1.00    Years: 20.00    Pack years: 20.00    Types: Cigarettes    Last attempt to quit: 08/29/1993    Years since quitting: 25.1  . Smokeless tobacco: Never Used  Substance Use Topics  . Alcohol use: Not Currently  . Drug use: Never    Review of Systems Constitutional: No fever/chills Eyes: No visual changes. ENT: No sore throat. Cardiovascular: Denies chest pain. Respiratory: Denies shortness of breath. Gastrointestinal: Positive for abdominal pain, diarrhea  Genitourinary: Positive for vaginal bleeding Musculoskeletal: Negative for back pain. Skin: Negative for rash. Neurological: Negative for headaches, focal weakness or numbness.  ____________________________________________   PHYSICAL EXAM:  VITAL SIGNS: ED Triage Vitals  Enc  Vitals Group     BP --      Pulse Rate 10/11/18 1411 64     Resp 10/11/18 1411 16     Temp 10/11/18 1411 98.5 F (36.9 C)     Temp Source 10/11/18 1411 Oral     SpO2 10/11/18 1411 98 %     Weight --      Height --      Head Circumference --      Peak Flow --      Pain Score 10/11/18 1409 10   Constitutional: Alert and oriented.  Eyes: Conjunctivae are normal.  ENT      Head: Normocephalic and atraumatic.      Nose: No congestion/rhinnorhea.      Mouth/Throat: Mucous membranes are moist.      Neck:  No stridor. Hematological/Lymphatic/Immunilogical: No cervical lymphadenopathy. Cardiovascular: Normal rate, regular rhythm.  No murmurs, rubs, or gallops.  Respiratory: Normal respiratory effort without tachypnea nor retractions. Breath sounds are clear and equal bilaterally. No wheezes/rales/rhonchi. Gastrointestinal: Soft and somewhat diffusely tender. No rebound. No guarding.  Genitourinary: Deferred Musculoskeletal: Normal range of motion in all extremities. No lower extremity edema. Neurologic:  Normal speech and language. No gross focal neurologic deficits are appreciated.  Skin:  Skin is warm, dry and intact. No rash noted. Psychiatric: Mood and affect are normal. Speech and behavior are normal. Patient exhibits appropriate insight and judgment.  ____________________________________________    LABS (pertinent positives/negatives)  Lipase 17 CBC wbc 6.2, hgb 9.7, plt 210 CMP na 140, k 4.6, cr 0.70  ____________________________________________   EKG  None  ____________________________________________    RADIOLOGY  None  ____________________________________________   PROCEDURES  Procedures  ____________________________________________   INITIAL IMPRESSION / ASSESSMENT AND PLAN / ED COURSE  Pertinent labs & imaging results that were available during my care of the patient were reviewed by me and considered in my medical decision making (see chart for  details).   Patient presented to the emergency department today because of concerns for vaginal bleeding, abdominal pain and diarrhea.  It appears that these are not new symptoms today.  Patient does have a history of known vulvar cancer and is following up with oncology.  In terms of the diarrhea and abdominal pain she was found to have C. difficile and was started on antibiotics.  At this point I think oral antibiotics is appropriate.  In discussion with the family they were hoping for further help at home.  Will have case management speak to patient and family to see if home health is a possibility.   ____________________________________________   FINAL CLINICAL IMPRESSION(S) / ED DIAGNOSES  Final diagnoses:  Diarrhea of infectious origin     Note: This dictation was prepared with Dragon dictation. Any transcriptional errors that result from this process are unintentional     Nance Pear, MD 10/12/18 1201

## 2018-10-11 NOTE — ED Notes (Signed)
Pt changed, she had a soiled pad.  Stool was toothpaste consistency.  Pt had significant skin breakdown in entire perineal area.  Unsure if some of the anatomical changes are from postoperative changes related to cancer in the past.  Pt has skin folds that are mascerated and painful and I used 3 bottles of perineal cleaner to attempt to clean area in addition to a qtip.  Large fissures are seen and it is unclear to me if these are post operative changes or skin breakdown or a combination of the both.  EDP asked to look at this.  Admission will be done to further meet pt needs

## 2018-10-12 LAB — CBC
HEMATOCRIT: 28.4 % — AB (ref 36.0–46.0)
Hemoglobin: 8.7 g/dL — ABNORMAL LOW (ref 12.0–15.0)
MCH: 25.7 pg — AB (ref 26.0–34.0)
MCHC: 30.6 g/dL (ref 30.0–36.0)
MCV: 84 fL (ref 80.0–100.0)
Platelets: 202 10*3/uL (ref 150–400)
RBC: 3.38 MIL/uL — AB (ref 3.87–5.11)
RDW: 16.2 % — AB (ref 11.5–15.5)
WBC: 8.3 10*3/uL (ref 4.0–10.5)
nRBC: 0 % (ref 0.0–0.2)

## 2018-10-12 LAB — BASIC METABOLIC PANEL
Anion gap: 3 — ABNORMAL LOW (ref 5–15)
BUN: 26 mg/dL — ABNORMAL HIGH (ref 8–23)
CHLORIDE: 110 mmol/L (ref 98–111)
CO2: 24 mmol/L (ref 22–32)
CREATININE: 0.82 mg/dL (ref 0.44–1.00)
Calcium: 7.5 mg/dL — ABNORMAL LOW (ref 8.9–10.3)
GFR calc Af Amer: >60 mL/min (ref >60)
GFR calc non Af Amer: >60 mL/min (ref >60)
GLUCOSE: 99 mg/dL (ref 70–99)
Potassium: 4.3 mmol/L (ref 3.5–5.1)
Sodium: 137 mmol/L (ref 135–145)

## 2018-10-12 LAB — PROTIME-INR
INR: 7.01 — AB
PROTHROMBIN TIME: 60 s — AB (ref 11.4–15.2)

## 2018-10-12 LAB — GLUCOSE, CAPILLARY: Glucose-Capillary: 90 mg/dL (ref 70–99)

## 2018-10-12 MED ORDER — FENTANYL 25 MCG/HR TD PT72
25.0000 ug | MEDICATED_PATCH | TRANSDERMAL | Status: DC
  Administered 2018-10-13 – 2018-10-22 (×4): 25 ug via TRANSDERMAL
  Filled 2018-10-12 (×5): qty 1

## 2018-10-12 NOTE — Consult Note (Addendum)
[place] for warfarin dosing Indication: atrial fibrillation  No Known Allergies  Labs: Recent Labs    10/10/18 1217 10/11/18 1413 10/11/18 1414 10/12/18 0428  HGB 9.9* 9.7*  --  8.7*  HCT 32.0* 30.9*  --  28.4*  PLT 219 210  --  202  LABPROT  --   --  52.9* 60.0*  INR  --   --  5.97* 7.01*  CREATININE 0.95 0.70  --  0.82    Estimated Creatinine Clearance: 46.7 mL/min (by C-G formula based on SCr of 0.82 mg/dL).   Assessment: 77 year-old female female on warfarin 3mg  daily PTA  On a recent admission here in September she exhibited the following response:  DATE   INR      Dose   9/14     1.84     3 mg  9/15     2.73     hold  9/16     2.36     2mg  9/17     2.67     2mg    Goal of Therapy:  INR 2-3 Monitor platelets by anticoagulation protocol: Yes   Plan:  Her INR remains supratherapeutic today at 7.01. No warfarin today. Follow up daily INR and pharmacy will begin warfarin when her INR is closer to goal range. Per RN no s/sx of bleeding noted at this time. Will order CBC for AM to monitor Hgb.    Pharmacy will continue to follow.   Rocky Morel, PharmD 10/12/2018,11:41 AM

## 2018-10-12 NOTE — Progress Notes (Signed)
PT Cancellation Note  Patient Details Name: Virginia Murray MRN: [id number] DOB: [birth year]   Cancelled Treatment:    Reason Eval/Treat Not Completed: Medical issues which prohibited therapy.  Order received.  Chart reviewed.  Pt has a documented INR of 7.01 and is not appropriate for PT per guidelines.  Will re-attempt later when more appropriate.   Roxanne Gates, PT, DPT 10/12/2018, 4:08 PM

## 2018-10-12 NOTE — Progress Notes (Signed)
[facility] [place] NAME: Virginia Murray    MR#:  [id number]  DATE OF BIRTH:  [birth year]  SUBJECTIVE: Admitted yesterday for diarrhea, abdominal pain found to have C. difficile colitis.  Still complains of abdominal pain.,  Diarrhea.  CHIEF COMPLAINT:   Chief Complaint  Patient presents with  . Diarrhea  . Vaginal Bleeding    REVIEW OF SYSTEMS:   ROS CONSTITUTIONAL: No fever.  Has generalized weakness. EYES: No blurred or double vision.  EARS, NOSE, AND THROAT: No tinnitus or ear pain.  RESPIRATORY: No cough, shortness of breath, wheezing or hemoptysis.  CARDIOVASCULAR: No chest pain, orthopnea, edema.  GASTROINTESTINAL: No nausea, vomiting, diarrhea or abdominal pain.  GENITOURINARY: No dysuria, hematuria.  ENDOCRINE: No polyuria, nocturia,  HEMATOLOGY: No anemia, easy bruising or bleeding SKIN: No rash or lesion. MUSCULOSKELETAL: No joint pain or arthritis.   NEUROLOGIC: No tingling, numbness, weakness.  PSYCHIATRY: No anxiety or depression.   DRUG ALLERGIES:  No Known Allergies  VITALS:  Blood pressure (!) 112/52, pulse 76, temperature 98.9 F (37.2 C), temperature source Oral, resp. rate 20, SpO2 96 %.  PHYSICAL EXAMINATION:  GENERAL:  77 y.o.-year-old patient lying in the bed with no acute distress.  Appears cachectic. EYES: Pupils equal, round, reactive to light and accommodation. No scleral icterus. Extraocular muscles intact.  HEENT: Head atraumatic, normocephalic. Oropharynx and nasopharynx clear.  NECK:  Supple, no jugular venous distention. No thyroid enlargement, no tenderness.  LUNGS: Normal breath sounds bilaterally, no wheezing, rales,rhonchi or crepitation. No use of accessory muscles of respiration.  CARDIOVASCULAR: S1, S2 normal. No murmurs, rubs, or gallops.  ABDOMEN:generalized abdominal tenderness present EXTREMITIES: No pedal edema, cyanosis, or clubbing.  NEUROLOGIC: Cranial nerves II through  XII are intact. Muscle strength 5/5 in all extremities. Sensation intact. Gait not checked.  PSYCHIATRIC: The patient is alert and oriented x 3.  SKIN: Noted to have excoriation in the vaginal area and also rectal area.   LABORATORY PANEL:   CBC Recent Labs  Lab 10/12/18 0428  WBC 8.3  HGB 8.7*  HCT 28.4*  PLT 202   ------------------------------------------------------------------------------------------------------------------  Chemistries  Recent Labs  Lab 10/07/18 1021  10/11/18 1413 10/12/18 0428  NA 135   < > 140 137  K 4.5   < > 4.6 4.3  CL 102   < > 113* 110  CO2 23   < > 22 24  GLUCOSE 186*   < > 97 99  BUN 30*   < > 30* 26*  CREATININE 0.96   < > 0.70 0.82  CALCIUM 8.6*   < > 8.1* 7.5*  MG 1.5*  --   --   --   AST 24   < > 16  --   ALT 13   < > 11  --   ALKPHOS 59   < > 54  --   BILITOT 1.0   < > 0.5  --    < > = values in this interval not displayed.   ------------------------------------------------------------------------------------------------------------------  Cardiac Enzymes No results for input(s): TROPONINI in the last 168 hours. ------------------------------------------------------------------------------------------------------------------  RADIOLOGY:  No results found.  EKG:   Orders placed or performed during the hospital encounter of 09/29/18  . ED EKG  . ED EKG  . EKG    ASSESSMENT AND PLAN:   #1 C. difficile colitis: Continue p.o. vancomycin, continue enteric precautions, patient has abdominal pain so continue home dose fentanyl patch, oxycodone, use Dilaudid  as needed for breakthrough pain, hold home dose MiraLAX. 2.  Chronic hypotension: Patient received fluids at oncology office, hold Norvasc, atenolol, benazepril, doxazosin.  #3 proximal H fibrillation: hold coumadin as INRI S HIGH 7.  No evidence of bleeding.  Ihold beta-blocker because of hypotension.  Blood pressure is better than yesterday but still soft.  #4 .recent  GBS bacteremia, hospitalized from 9/14 , 9/18, patient received Rocephin for 2 weeks via port, repeat blood cultures from 1011 at oncology office did not show any growth for 24 hours. 5.  Recurrent vulvar cancer, patient is undergoing chemotherapy, history of radical vulvectomy in 2007, follows up with Dr. Tasia Catchings as an outpatient, wound care consult requested for vulvar area excoriation. #6. hyperlipidemia: Continue statins.  All the records are reviewed and case discussed with Care Management/Social Workerr. Management plans discussed with the patient, family and they are in agreement.  CODE STATUS: DNR  TOTAL TIME TAKING CARE OF THIS PATIENT: 40 minutes.   POSSIBLE D/C IN 1-2 DAYS, DEPENDING ON CLINICAL CONDITION.   Epifanio Lesches M.D on 10/12/2018 at 12:56 PM  Between 7am to 6pm - Pager - [phone]  After 6pm go to [email or web address] - password [email or web address] [facility]  [facility] [place]  Office  [phone]  CC: Primary care physician; Pleas Koch, NP   Note: This dictation was prepared with Dragon dictation along with smaller phrase technology. Any transcriptional errors that result from this process are unintentional.

## 2018-10-13 ENCOUNTER — Ambulatory Visit: Payer: Medicare Other | Admitting: Primary Care

## 2018-10-13 ENCOUNTER — Inpatient Hospital Stay: Payer: Medicare Other

## 2018-10-13 ENCOUNTER — Other Ambulatory Visit: Payer: Self-pay

## 2018-10-13 ENCOUNTER — Encounter

## 2018-10-13 DIAGNOSIS — R1084 Generalized abdominal pain: Secondary | ICD-10-CM

## 2018-10-13 DIAGNOSIS — A09 Infectious gastroenteritis and colitis, unspecified: Secondary | ICD-10-CM

## 2018-10-13 DIAGNOSIS — C519 Malignant neoplasm of vulva, unspecified: Secondary | ICD-10-CM

## 2018-10-13 LAB — CBC
HCT: 31.1 % — ABNORMAL LOW (ref 36.0–46.0)
Hemoglobin: 9.5 g/dL — ABNORMAL LOW (ref 12.0–15.0)
MCH: 25.7 pg — ABNORMAL LOW (ref 26.0–34.0)
MCHC: 30.5 g/dL (ref 30.0–36.0)
MCV: 84.1 fL (ref 80.0–100.0)
PLATELETS: 207 10*3/uL (ref 150–400)
RBC: 3.7 MIL/uL — ABNORMAL LOW (ref 3.87–5.11)
RDW: 16.4 % — AB (ref 11.5–15.5)
WBC: 13.1 10*3/uL — AB (ref 4.0–10.5)
nRBC: 0 % (ref 0.0–0.2)

## 2018-10-13 LAB — PROTIME-INR
INR: 5.3 — AB
PROTHROMBIN TIME: 48.2 s — AB (ref 11.4–15.2)

## 2018-10-13 MED ORDER — SODIUM CHLORIDE 0.9 % IV SOLN
INTRAVENOUS | Status: DC
  Administered 2018-10-13 – 2018-10-18 (×14): via INTRAVENOUS

## 2018-10-13 MED ORDER — ADULT MULTIVITAMIN W/MINERALS CH
1.0000 | ORAL_TABLET | Freq: Every day | ORAL | Status: DC
  Administered 2018-10-14 – 2018-10-23 (×10): 1 via ORAL
  Filled 2018-10-13 (×11): qty 1

## 2018-10-13 MED ORDER — MIRTAZAPINE 15 MG PO TABS
7.5000 mg | ORAL_TABLET | Freq: Every day | ORAL | Status: DC
  Administered 2018-10-13 – 2018-10-22 (×10): 7.5 mg via ORAL
  Filled 2018-10-13 (×10): qty 1

## 2018-10-13 MED ORDER — WARFARIN - PHARMACIST DOSING INPATIENT: Freq: Every day | Status: DC

## 2018-10-13 MED ORDER — GERHARDT'S BUTT CREAM
TOPICAL_CREAM | Freq: Three times a day (TID) | CUTANEOUS | Status: DC
  Administered 2018-10-13 – 2018-10-23 (×30): via TOPICAL
  Filled 2018-10-13 (×2): qty 1

## 2018-10-13 MED ORDER — IOPAMIDOL (ISOVUE-300) INJECTION 61%
15.0000 mL | INTRAVENOUS | Status: AC
  Administered 2018-10-13 (×2): 15 mL via ORAL

## 2018-10-13 MED ORDER — IOHEXOL 300 MG/ML  SOLN
75.0000 mL | Freq: Once | INTRAMUSCULAR | Status: AC | PRN
  Administered 2018-10-13: 75 mL via INTRAVENOUS

## 2018-10-13 MED ORDER — ENSURE ENLIVE PO LIQD
237.0000 mL | Freq: Three times a day (TID) | ORAL | Status: DC
  Administered 2018-10-13 – 2018-10-18 (×4): 237 mL via ORAL

## 2018-10-13 MED ORDER — SODIUM CHLORIDE 0.9 % IV SOLN
Freq: Once | INTRAVENOUS | Status: AC
  Administered 2018-10-13 (×2): via INTRAVENOUS

## 2018-10-13 NOTE — Progress Notes (Signed)
PT Cancellation Note  Patient Details Name: Virginia Murray MRN: [id number] DOB: August 24, 1941   Cancelled Treatment:    Reason Eval/Treat Not Completed: Medical issues which prohibited therapy(BP 86/43).  Pt hypotensive at baseline but this is below her baseline.  Will hold PT until pt more medically appropriate. Will attempt to see pt again later today, schedule permitting.    Collie Siad PT, DPT 10/13/2018, 10:23 AM

## 2018-10-13 NOTE — Progress Notes (Signed)
[facility] [place] NAME: Virginia Murray    MR#:  [id number]  DATE OF BIRTH:  26-Dec-1941  SUBJECTIVE: Diarrhea is less but still has severe abdominal pain, vulvar pain.  Seen by wound care nurse, concern about erythematous area in the perineum appearing like ongoing cancer  In vulvar area.  Patient is hypotensive today.  CHIEF COMPLAINT:   Chief Complaint  Patient presents with  . Diarrhea  . Vaginal Bleeding   Appears uncomfortable because of the pain in the vulvar area and abdomen. REVIEW OF SYSTEMS:   ROS CONSTITUTIONAL: No fever.  Has generalized weakness. EYES: No blurred or double vision.  EARS, NOSE, AND THROAT: No tinnitus or ear pain.  RESPIRATORY: No cough, shortness of breath, wheezing or hemoptysis.  CARDIOVASCULAR: No chest pain, orthopnea, edema.  GASTROINTESTINAL: Diarrhea resolved but still has generalized abdominal pain, vulvar pain.  GENITOURINARY: No dysuria, hematuria.  ENDOCRINE: No polyuria, nocturia,  HEMATOLOGY: No anemia, easy bruising or bleeding SKIN: No rash or lesion. MUSCULOSKELETAL: No joint pain or arthritis.   NEUROLOGIC: No tingling, numbness, weakness.  PSYCHIATRY: No anxiety or depression.   DRUG ALLERGIES:  No Known Allergies  VITALS:  Blood pressure (!) 86/43, pulse 90, temperature 99.2 F (37.3 C), temperature source Oral, resp. rate (!) 24, SpO2 93 %.  PHYSICAL EXAMINATION:  GENERAL:  77 y.o.-year-old patient lying in the bed with no acute distress.  Appears cachectic. EYES: Pupils equal, round, reactive to light and accommodation. No scleral icterus. Extraocular muscles intact.  HEENT: Head atraumatic, normocephalic. Oropharynx and nasopharynx clear.  NECK:  Supple, no jugular venous distention. No thyroid enlargement, no tenderness.  LUNGS: Normal breath sounds bilaterally, no wheezing, rales,rhonchi or crepitation. No use of accessory muscles of respiration.  CARDIOVASCULAR:  S1, S2 normal. No murmurs, rubs, or gallops.  ABDOMEN:generalized abdominal tenderness present EXTREMITIES: No pedal edema, cyanosis, or clubbing.  NEUROLOGIC: Cranial nerves II through XII are intact. Muscle strength 5/5 in all extremities. Sensation intact. Gait not checked.  PSYCHIATRIC: The patient is alert and oriented x 3.  SKIN: Noted to have excoriation in the vaginal area and also rectal area.   LABORATORY PANEL:   CBC Recent Labs  Lab 10/13/18 0406  WBC 13.1*  HGB 9.5*  HCT 31.1*  PLT 207   ------------------------------------------------------------------------------------------------------------------  Chemistries  Recent Labs  Lab 10/07/18 1021  10/11/18 1413 10/12/18 0428  NA 135   < > 140 137  K 4.5   < > 4.6 4.3  CL 102   < > 113* 110  CO2 23   < > 22 24  GLUCOSE 186*   < > 97 99  BUN 30*   < > 30* 26*  CREATININE 0.96   < > 0.70 0.82  CALCIUM 8.6*   < > 8.1* 7.5*  MG 1.5*  --   --   --   AST 24   < > 16  --   ALT 13   < > 11  --   ALKPHOS 59   < > 54  --   BILITOT 1.0   < > 0.5  --    < > = values in this interval not displayed.   ------------------------------------------------------------------------------------------------------------------  Cardiac Enzymes No results for input(s): TROPONINI in the last 168 hours. ------------------------------------------------------------------------------------------------------------------  RADIOLOGY:  No results found.  EKG:   Orders placed or performed during the hospital encounter of 09/29/18  . ED EKG  . ED EKG  .  EKG    ASSESSMENT AND PLAN:   #1 C. difficile colitis: Continue p.o. vancomycin, continue enteric precautions, p continue p.o. vancomycin, diarrhea is less today.  Continue p.o. vancomycin for total of 14 days.  2.  Chronic hypotension: Continue IV fluids, continue to hold Norvasc, atenolol, benazepril.  Give fluid bolus 500 cc NS because of hypotension now. #3 .proximal H  fibrillation: Hold Coumadin as INR is still high at 5.3.  Hemoglobin stable around 9.5 without evidence of active bleeding.  #4 .recent GBS bacteremia, hospitalized from 9/14 , 9/18, patient received Rocephin for 2 weeks via port, repeat blood cultures from 101/1 at oncology office did not show any growth for 24 hours. 5.  Recurrent vulvar cancer, patient is undergoing chemotherapy, history of radical vulvectomy in 2007, follows up with Dr. Tasia Catchings as an outpatient, according to Dr. Collie Siad note patient is not a candidate for further gynecological surgery due to her poor performance status.  Dr. you discussed with the event oncology Dr. Theora Gianotti.  Patient seen by palliative care team Josh as an outpatient.  Patient is still getting chemotherapy with carboplatin, Taxol, has recurrent vulvar cancer with involvement of vagina, rectum, obtain oncology consult while she is here, obtain palliative care consult, continue pain medicine including fentanyl patch for cancer related pain, because of hypotension unable to use oxycodone or Dilaudid IV as needed. #6. hyperlipidemia: Continue statins. Plan for palliative care consult, oncology consult today Prognosis poor, high risk for cardiac arrest, CODE STATUS DNR  All the records are reviewed and case discussed with Care Management/Social Workerr. Management plans discussed with the patient, family and they are in agreement.  CODE STATUS: DNR  TOTAL TIME TAKING CARE OF THIS PATIENT: 40 minutes.   POSSIBLE D/C IN 1-2 DAYS, DEPENDING ON CLINICAL CONDITION.   Epifanio Lesches M.D on 10/13/2018 at 10:54 AM  Between 7am to 6pm - Pager - [phone]  After 6pm go to [email or web address] - password [email or web address] [facility]  [place] [facility]  Office  [phone]  CC: Primary care physician; Pleas Koch, NP   Note: This dictation was prepared with Dragon dictation along with smaller phrase technology. Any transcriptional errors that result from this process are  unintentional.

## 2018-10-13 NOTE — Plan of Care (Signed)
Patient stated her comfort is improving  as well as her breathing progressing toward discharge

## 2018-10-13 NOTE — Consult Note (Addendum)
Hematology/Oncology Consult note [facility] Telephone:([phone] [phone]  Patient Care Team: Pleas Koch, NP as PCP - General (Internal Medicine) Clent Jacks, RN as Registered Nurse Earlie Server, MD as Medical Oncologist (Medical Oncology) Gillis Ends, MD as Consulting Physician (Gynecologic Oncology)   Name of the patient: Virginia Murray  [id number]  09-23-1941   Date of visit: 10/13/18 REASON FOR COSULTATION:  Vulvar cancer History of presenting illness-  77 y.o. female with PMH including locally advanced vulvar cancer, recent bacteremia, anxiety, chronic A. fib on Coumadin who presents to ER for evaluation of several days of diarrhea and diffuse abdominal discomfort.  Outpatient C. difficile was sent on 10/11/2018 and came back positive.  Vancomycin was called into the pharmacy however patient did not pick up.  Due to the significant diarrhea and weakness.  He presented to the hospital and get admitted for supportive care and C. difficile colitis Patient appears anxious and tells me that she is frustrated that things are not going well.  She still have ongoing lower abdominal discomfort and ongoing loose stool episodes.  She asked me to call her daughter-in-law Sharyn Lull as she is not going to remember anything that I tell her. She feels that her perineum pain is also not controlled.     Review of Systems  Constitutional: Positive for malaise/fatigue. Negative for chills and fever.  HENT: Negative for sore throat.   Eyes: Negative for redness.  Respiratory: Negative for cough.   Cardiovascular: Negative for chest pain.  Gastrointestinal: Positive for abdominal pain and diarrhea. Negative for nausea and vomiting.  Skin: Negative for rash.  Neurological: Positive for weakness. Negative for focal weakness.  Endo/Heme/Allergies: Bruises/bleeds easily.  Psychiatric/Behavioral: The patient is nervous/anxious and has  insomnia.     No Known Allergies  Patient Active Problem List   Diagnosis Date Noted  . C. difficile colitis 10/11/2018  . Diarrhea 10/10/2018  . Nocturnal leg cramps 10/07/2018  . Atrial fibrillation ([place]) 09/26/2018  . Long term (current) use of anticoagulants 09/26/2018  . Hyperlipidemia 09/26/2018  . Constipation due to opioid therapy 09/26/2018  . GAD (generalized anxiety disorder)   . Agitation   . Vulvar cancer ([place]) 09/03/2018  . Goals of care, counseling/discussion 09/03/2018     Past Medical History:  Diagnosis Date  . A-fib ([place])   . Cancer (H[facility])    vulva   . Depression   . Diverticulitis   . Hyperlipemia   . Hypertension   . Vulva cancer [facility])      Past Surgical History:  Procedure Laterality Date  . ABDOMINAL HYSTERECTOMY    . CATARACT EXTRACTION, BILATERAL    . KNEE SURGERY     right knee   . PORTA CATH INSERTION N/A 09/04/2018   Procedure: PORTA CATH INSERTION;  Surgeon: Algernon Huxley, MD;  Location: [facility];  Service: Cardiovascular;  Laterality: N/A;  . TEE WITHOUT CARDIOVERSION N/A 09/16/2018   Procedure: TRANSESOPHAGEAL ECHOCARDIOGRAM (TEE);  Surgeon: Nelva Bush, MD;  Location: [facility] ORS;  Service: Cardiovascular;  Laterality: N/A;  . TONSILLECTOMY    . VULVA SURGERY      Social History   Socioeconomic History  . Marital status: Widowed    Spouse name: Not on file  . Number of children: 2  . Years of education: Not on file  . Highest education level: Not on file  Occupational History  . Not on file  Social Needs  . Financial resource strain: Not on  file  . Food insecurity:    Worry: Not on file    Inability: Not on file  . Transportation needs:    Medical: Not on file    Non-medical: Not on file  Tobacco Use  . Smoking status: Former Smoker    Packs/day: 1.00    Years: 20.00    Pack years: 20.00    Types: Cigarettes    Last attempt to quit: 08/29/1993    Years since quitting: 25.1  . Smokeless tobacco: Never  Used  Substance and Sexual Activity  . Alcohol use: Not Currently  . Drug use: Never  . Sexual activity: Not Currently  Lifestyle  . Physical activity:    Days per week: Not on file    Minutes per session: Not on file  . Stress: Not on file  Relationships  . Social connections:    Talks on phone: Not on file    Gets together: Not on file    Attends religious service: Not on file    Active member of club or organization: Not on file    Attends meetings of clubs or organizations: Not on file    Relationship status: Not on file  . Intimate partner violence:    Fear of current or ex partner: Not on file    Emotionally abused: Not on file    Physically abused: Not on file    Forced sexual activity: Not on file  Other Topics Concern  . Not on file  Social History Narrative  . Not on file     Family History  Problem Relation Age of Onset  . Alzheimer's disease Mother   . Heart disease Father      Current Facility-Administered Medications:  .  acetaminophen (TYLENOL) tablet 650 mg, 650 mg, Oral, Q6H PRN **OR** acetaminophen (TYLENOL) suppository 650 mg, 650 mg, Rectal, Q6H PRN, Mayo, Pete Pelt, MD .  fentaNYL (DURAGESIC - dosed mcg/hr) patch 25 mcg, 25 mcg, Transdermal, Q72H, Mayo, Pete Pelt, MD, 25 mcg at 10/13/18 0935 .  gabapentin (NEURONTIN) capsule 300 mg, 300 mg, Oral, QHS, Mayo, Pete Pelt, MD, 300 mg at 10/12/18 2103 .  Gerhardt's butt cream, , Topical, TID, Epifanio Lesches, MD .  HYDROmorphone (DILAUDID) injection 0.5 mg, 0.5 mg, Intravenous, Q4H PRN, Mayo, Pete Pelt, MD, 0.5 mg at 10/12/18 1836 .  ondansetron (ZOFRAN) tablet 4 mg, 4 mg, Oral, Q6H PRN **OR** ondansetron (ZOFRAN) injection 4 mg, 4 mg, Intravenous, Q6H PRN, Mayo, Pete Pelt, MD .  oxyCODONE (Oxy IR/ROXICODONE) immediate release tablet 5 mg, 5 mg, Oral, Q4H PRN, Mayo, Pete Pelt, MD, 5 mg at 10/13/18 1201 .  pravastatin (PRAVACHOL) tablet 40 mg, 40 mg, Oral, Daily, Mayo, Pete Pelt, MD, 40 mg at 10/12/18  0950 .  prochlorperazine (COMPAZINE) tablet 10 mg, 10 mg, Oral, Q6H PRN, Mayo, Pete Pelt, MD .  vancomycin (VANCOCIN) 50 mg/mL oral solution 125 mg, 125 mg, Oral, QID, Mayo, Pete Pelt, MD, 125 mg at 10/13/18 0944 .  vitamin B-12 (CYANOCOBALAMIN) tablet 1,000 mcg, 1,000 mcg, Oral, Daily, Mayo, Pete Pelt, MD, 1,000 mcg at 10/13/18 0943 .  Warfarin - Pharmacist Dosing Inpatient, , Does not apply, q1800, Shari Prows [facility]   Physical exam:  Vitals:   10/13/18 0512 10/13/18 0900 10/13/18 1131 10/13/18 1200  BP: (!) 89/54 (!) 86/43 (!) 95/47 (!) 94/54  Pulse: (!) 107 90 (!) 102 (!) 103  Resp:      Temp:    99.8 F (37.7 C)  TempSrc:  Oral  SpO2: 93%   95%   Physical Exam  Constitutional:  Mild distress, nervous  HENT:  Head: Normocephalic and atraumatic.  Dry oral mucosa  Eyes: Pupils are equal, round, and reactive to light. EOM are normal.  Neck: Normal range of motion. Neck supple.  Cardiovascular: Normal rate and regular rhythm.  Pulmonary/Chest: Effort normal. No respiratory distress. She exhibits no tenderness.  Abdominal: Soft. She exhibits no mass. There is tenderness.  Lower abdomen tenderness with palpation.   Musculoskeletal: Normal range of motion. She exhibits no edema.  Lymphadenopathy:    She has no cervical adenopathy.  Neurological: She is alert.  Skin: Skin is warm and dry. No erythema.  Psychiatric:  Anxious and nervous        CMP Latest Ref Rng & Units 10/12/2018  Glucose 70 - 99 mg/dL 99  BUN 8 - 23 mg/dL 26(H)  Creatinine 0.44 - 1.00 mg/dL 0.82  Sodium 135 - 145 mmol/L 137  Potassium 3.5 - 5.1 mmol/L 4.3  Chloride 98 - 111 mmol/L 110  CO2 22 - 32 mmol/L 24  Calcium 8.9 - 10.3 mg/dL 7.5(L)  Total Protein 6.5 - 8.1 g/dL -  Total Bilirubin 0.3 - 1.2 mg/dL -  Alkaline Phos 38 - 126 U/L -  AST 15 - 41 U/L -  ALT 0 - 44 U/L -   CBC Latest Ref Rng & Units 10/13/2018  WBC 4.0 - 10.5 K/uL 13.1(H)  Hemoglobin 12.0 - 15.0 g/dL 9.5(L)    Hematocrit 36.0 - 46.0 % 31.1(L)  Platelets 150 - 400 K/uL 207   RADIOGRAPHIC STUDIES: I have personally reviewed the radiological images as listed and agreed with the findings in the report.   Ct Head Wo Contrast  Result Date: 09/29/2018 CLINICAL DATA:  Syncopal episode, dizziness, weakness, fall and head trauma EXAM: CT HEAD WITHOUT CONTRAST TECHNIQUE: Contiguous axial images were obtained from the base of the skull through the vertex without intravenous contrast. COMPARISON:  09/15/2018 FINDINGS: Brain: Stable atrophy pattern and chronic white matter microvascular ischemic changes throughout both cerebral hemispheres. No acute intracranial hemorrhage, new infarction, mass lesion, midline shift, herniation, hydrocephalus, or extra-axial fluid collection. No focal mass effect or edema. Cisterns are patent. Cerebellar atrophy as well. Vascular: Intracranial atherosclerosis.  No hyperdense vessel. Skull: Normal. Negative for fracture or focal lesion. Sinuses/Orbits: No acute orbital finding. Minor right ethmoid mucosal thickening. Other sinuses visualized are clear. Mastoids are clear. Other: None. IMPRESSION: Stable atrophy pattern and chronic white matter microvascular ischemic changes. No acute intracranial abnormality by noncontrast CT. Electronically Signed   By: Jerilynn Mages.  Shick M.D.   On: 09/29/2018 21:08   Ct Head Wo Contrast  Result Date: 09/15/2018 CLINICAL DATA:  77 y/o F; anxiety, agitation, possible personality change. Rule out intracranial process. History of vulvar cancer. EXAM: CT HEAD WITHOUT CONTRAST TECHNIQUE: Contiguous axial images were obtained from the base of the skull through the vertex without intravenous contrast. COMPARISON:  None. FINDINGS: Brain: No evidence of acute infarction, hemorrhage, hydrocephalus, extra-axial collection or mass lesion/mass effect. Nonspecific white matter hypodensities are compatible with mild chronic microvascular ischemic changes. Mild volume loss of  the brain for age. Vascular: Calcific atherosclerosis of the carotid siphons. No hyperdense vessel identified. Skull: Normal. Negative for fracture or focal lesion. Sinuses/Orbits: No acute finding. Other: None. IMPRESSION: 1. No acute intracranial abnormality identified. 2. Mild for age chronic microvascular ischemic changes and volume loss of the brain. Electronically Signed   By: Kristine Garbe M.D.   On:  09/15/2018 18:52    Assessment and plan- Patient is a 77 y.o. female with history of recurrent locally advanced vulvar cancer, currently on chemotherapy, A. fib on Coumadin, hypertension, anxiety/depression presents for evaluation of abdominal discomfort and C. difficile colitis.  #C. difficile colitis/diarrhea, agree with oral vancomycin 125 mg daily Still have abdominal pain/tenderness, recommend CT abdomen/pelvis for further evaluation.   #Hypotension, agree with holding hypertensive medication.  This is likely due to hypovolemia secondary to ongoing diarrhea.  Agrees with hydration with IV normal saline, will increase IV NS to 163ml/hour. #Recurrent vulvar cancer, outpatient follow-up at the cancer center for evaluation of next cycle of chemotherapy.  She is not due for next round of chemotherapy until 10/24/2018.  # neoplasm related pain: continue Fentanyl patch and oxycodone 5mg  Q4-6 hours as needed.  # Recent bacteremia,s/p IV antibiotics course.  recent peripheral blood culture negative.  # Anxiety and depression. Chronic problem for her. Not willing to take Zoloft as it causes her to have dizziness. Consider adding Remeron.  Emotional support provided.I called her daughter in law as she requested. Discussed with Daughter in law and she is concerned and wants to talk to case manager.  # Supratherapeutic INR: hold coumadin.   Thank you for allowing me to participate in the care of this patient.  Total face to face encounter time for this patient visit was 75 min. >50% of the time  was  spent in counseling and coordination of care.    Earlie Server, MD, PhD Hematology Oncology [facility] at [facility] Pager- [phone] 10/13/2018

## 2018-10-13 NOTE — Care Management Note (Signed)
Case Management Note  Patient Details  Name: Virginia Murray MRN: [id number] Date of Birth: 07-16-1941  Subjective/Objective:   Admitted to [facility] with the diagnosis of C-Diff Colitis. Lives with son, Laverna Peace, and daughter-in-law Sharyn Lull [phone]). Last seen Dr, Louretta Parma 09/26/18. [facility] is following for services in the home. No skilled facility. No home oxygen. Takes care of all basic activities of daily living herself, Last fall was about a month ago. Decreased appetite. Unsure as to how much weight she has lost, Port is in place.               Action/Plan: Will update [facility] representative concerning this admission.   Expected Discharge Date:                  Expected Discharge Plan:     In-House Referral:   yes  Discharge planning Services   yes  Post Acute Care Choice:    Choice offered to:     DME Arranged:    DME Agency:     HH Arranged:   yes HH Agency:   [facility]  Status of Service:     If discussed at [place] of Stay Meetings, dates discussed:    Additional Comments:  Shelbie Ammons, RN MSN CCM Care Management [phone] 10/13/2018, 10:28 AM

## 2018-10-13 NOTE — Consult Note (Signed)
[place] for warfarin dosing Indication: atrial fibrillation  No Known Allergies  Labs: Recent Labs    10/10/18 1217 10/11/18 1413 10/11/18 1414 10/12/18 0428 10/13/18 0406  HGB 9.9* 9.7*  --  8.7* 9.5*  HCT 32.0* 30.9*  --  28.4* 31.1*  PLT 219 210  --  202 207  LABPROT  --   --  52.9* 60.0* 48.2*  INR  --   --  5.97* 7.01* 5.30*  CREATININE 0.95 0.70  --  0.82  --     Estimated Creatinine Clearance: 46.7 mL/min (by C-G formula based on SCr of 0.82 mg/dL).   Assessment: 77 year-old female female on warfarin 3mg  daily PTA  On a recent admission here in September she exhibited the following response:  DATE   INR      Dose   9/14     1.84     3 mg  9/15     2.73     hold  9/16     2.36     2mg  9/17     2.67     2mg    Goal of Therapy:  INR 2-3 Monitor platelets by anticoagulation protocol: Yes   Plan:  Her INR remains supratherapeutic today at 5.30. No warfarin today. Follow up daily INR and pharmacy will begin warfarin when her INR is closer to goal range. Per RN no s/sx of bleeding noted at this time. Will order CBC for AM to monitor Hgb.  Pharmacy will continue to follow.   Forrest Moron, PharmD 10/13/2018,7:18 AM

## 2018-10-13 NOTE — Consult Note (Addendum)
[place] Nurse wound consult note Patient evaluated in [facility]. No family present. Reason for Consult: Perineal skin issues related to surgery, diarrhea.  The perineum is extremely painful to touch.  Wound type: Skin issues are related to fecal and urinary incontinence, and changes associated with surgery to the area. The patient was incontinent of urine and feces at the time of my assessment. Wound bed: There are scattered areas that are erythematous, and there is an area on the patient's left perineal area that has a general appearance of a wound that is blackened, perhaps some type of ongoing cancerous lesion? Or, perhaps, a result of surgery for her cancerous condition?  At any rate, she cannot tolerate anyone touching it due to the extreme pain and sensitivity.  If possible to get the oncology service following her to look at the area, please do so. Periwound: Scattered erythema. Dressing procedure/placement/frequency: Cleanse perineal area with spray, no rinse cleanser (available in the supply room).  Apply Gerhardt's butt cream TID. Monitor the wound area(s) for worsening of condition such as: Signs/symptoms of infection,  Increase in size,  Development of or worsening of odor, Development of pain, or increased pain at the affected locations.  Notify the medical team if any of these develop.  Thank you for the consult.  Discussed plan of care with the patient and bedside nurse.  [place] nurse will not follow at this time.  Please re-consult the [place] team if needed.  Val Riles, RN, MSN, CWOCN, CNS-BC, pager [phone]

## 2018-10-13 NOTE — Progress Notes (Signed)
Patient spiked a fever this evening once she returned from [place] and Dr. Vianne Bulls notified. Patient asymptomatic otherwise.

## 2018-10-13 NOTE — Progress Notes (Signed)
Initial Nutrition Assessment  DOCUMENTATION CODES:   Not applicable  INTERVENTION:  Ensure Enlive TID: to provide 350kcal, 20g protein per serving MVI   NUTRITION DIAGNOSIS:   Inadequate oral intake related to diarrhea, acute illness(C-diff) as evidenced by energy intake < 75% for > 7 days  GOAL:   Patient will meet greater than or equal to 90% of their needs    MONITOR:   PO intake, Supplement acceptance, Weight trends  REASON FOR ASSESSMENT:   Malnutrition Screening Tool    ASSESSMENT:  77 yo pt ED to hospital admission on 10/12 for diarrhea, abdominal pain, and weakness lasting several days; C-diff positive. Recent 9/14-9/18 admission for GBS bacteremia. Hx of vulvar cancer, HTN, Diverticulitis, Hysterectomy, Vulva surgery, Porta Cath   Pt lying in bed, and reports improvements to diarrhea and and c/o  abdominal pain at visit. Pt reported 50% of scrambled eggs and stated she likes eggs served over easy and has not enjoyed any of her meals during length of stay. RD encouraged pt to choose items from the menu that she likes for increased PO intake. Pt reports grazing at home throughout the day and breakfast as the only set meal; toast w/ eggs or waffle and coffee. Pt snacks on potato chips, likes ice cream, and prepares "quick and easy" meals.  RD inquired on recent wt loss/gain and pt became tearful and began raising her voice. Pt confirmed experiencing wt loss after her husbands passing in May and loudly expressed that RD had no idea what it was like. RD shared w/ pt personal recent loss and local grief share group she could attend if interested. Pt thankful and apologetic for raising her voice.   Medications: Fentanyl, vancomycin, B12, oxycodone Labs: No new labs since 10/13  NUTRITION - FOCUSED PHYSICAL EXAM:    Most Recent Value  Orbital Region  Mild depletion  Upper Arm Region  Mild depletion  Thoracic and Lumbar Region  Unable to assess  Buccal Region  Mild  depletion  Temple Region  Mild depletion  Clavicle Bone Region  Unable to assess  Clavicle and Acromion Bone Region  Unable to assess  Scapular Bone Region  Unable to assess  Dorsal Hand  Mild depletion  Patellar Region  Unable to assess  Anterior Thigh Region  Unable to assess  Posterior Calf Region  Unable to assess  Edema (RD Assessment)  None  Hair  Reviewed  Eyes  Reviewed  Mouth  Reviewed  Skin  Reviewed  Nails  Reviewed       Diet Order:   Diet Order            Diet regular Room service appropriate? Yes; Fluid consistency: Thin  Diet effective now              EDUCATION NEEDS:   No education needs have been identified at this time  Skin:  Skin Assessment: Reviewed RN Assessment(bilateral perineum; moisture associated skin damage; )  Last BM:  10/13: Type 7; Med, Brown  Height:   Ht Readings from Last 1 Encounters:  09/29/18 5\' 3"  (1.6 m)    Weight:   Wt Readings from Last 1 Encounters:  10/10/18 49.9 kg    Ideal Body Weight:  52.3 kg  BMI:  There is no height or weight on file to calculate BMI.  Estimated Nutritional Needs:   Kcal:  1225-1350  Protein:  65-80  Fluid:  1.2L    Lajuan Lines, RD, LDN  After Hours/Weekend Pager: [phone]

## 2018-10-14 ENCOUNTER — Inpatient Hospital Stay: Payer: Medicare Other

## 2018-10-14 ENCOUNTER — Ambulatory Visit: Payer: Medicare Other

## 2018-10-14 DIAGNOSIS — R791 Abnormal coagulation profile: Secondary | ICD-10-CM

## 2018-10-14 DIAGNOSIS — F411 Generalized anxiety disorder: Secondary | ICD-10-CM

## 2018-10-14 DIAGNOSIS — F32A Depression, unspecified: Secondary | ICD-10-CM

## 2018-10-14 DIAGNOSIS — R451 Restlessness and agitation: Secondary | ICD-10-CM

## 2018-10-14 DIAGNOSIS — F329 Major depressive disorder, single episode, unspecified: Secondary | ICD-10-CM

## 2018-10-14 LAB — CBC
HCT: 30.9 % — ABNORMAL LOW (ref 36.0–46.0)
Hemoglobin: 9.5 g/dL — ABNORMAL LOW (ref 12.0–15.0)
MCH: 25.8 pg — ABNORMAL LOW (ref 26.0–34.0)
MCHC: 30.7 g/dL (ref 30.0–36.0)
MCV: 84 fL (ref 80.0–100.0)
PLATELETS: 244 10*3/uL (ref 150–400)
RBC: 3.68 MIL/uL — ABNORMAL LOW (ref 3.87–5.11)
RDW: 16.9 % — AB (ref 11.5–15.5)
WBC: 22.1 10*3/uL — ABNORMAL HIGH (ref 4.0–10.5)
nRBC: 0.1 % (ref 0.0–0.2)

## 2018-10-14 LAB — PROTIME-INR
INR: 8.94 — AB
Prothrombin Time: 72.5 seconds — ABNORMAL HIGH (ref 11.4–15.2)

## 2018-10-14 MED ORDER — VITAMIN K1 1 MG/0.5ML IJ SOLN
1.0000 mg | Freq: Once | INTRAMUSCULAR | Status: AC
  Administered 2018-10-14: 10:00:00 1 mg via INTRAMUSCULAR
  Filled 2018-10-14: qty 0.5

## 2018-10-14 MED ORDER — LACTATED RINGERS IV BOLUS
1000.0000 mL | Freq: Once | INTRAVENOUS | Status: AC
  Administered 2018-10-14: 06:00:00 1000 mL via INTRAVENOUS

## 2018-10-14 MED ORDER — SODIUM CHLORIDE 0.9 % IV BOLUS
500.0000 mL | Freq: Once | INTRAVENOUS | Status: AC
  Administered 2018-10-14: 21:00:00 500 mL via INTRAVENOUS

## 2018-10-14 NOTE — Progress Notes (Signed)
PT Cancellation Note  Patient Details Name: Virginia Murray MRN: [id number] DOB: 1941/11/23   Cancelled Treatment:    Reason Eval/Treat Not Completed: Medical issues which prohibited therapy(INR 8.94).  Per protocol, will hold PT until pt more medically appropriate.     Collie Siad PT, DPT 10/14/2018, 8:32 AM

## 2018-10-14 NOTE — Progress Notes (Signed)
Hematology/Oncology Progress Note [facility] Telephone:([phone] [phone]  Patient Care Team: Pleas Koch, NP as PCP - General (Internal Medicine) Clent Jacks, RN as Registered Nurse Earlie Server, MD as Medical Oncologist (Medical Oncology) Gillis Ends, MD as Consulting Physician (Gynecologic Oncology)   Name of the patient: Virginia Murray  [id number]  1941/12/25  Date of visit: 10/14/18   INTERVAL HISTORY-  Overnight continues to have low blood pressure.  Temperature 100.3.  She was sitting in the bed and eating lunch. Still have diarrhea, and abdominal discomfort, told me that she had less diarrhea episodes. Feels slightly better.   Review of systems- Review of Systems  Constitutional: Positive for malaise/fatigue. Negative for chills and fever.  HENT: Negative for nosebleeds and sore throat.   Eyes: Negative for double vision, photophobia and redness.  Respiratory: Negative for cough, shortness of breath and wheezing.   Cardiovascular: Negative for chest pain, palpitations and orthopnea.  Gastrointestinal: Positive for abdominal pain and diarrhea. Negative for blood in stool, nausea and vomiting.  Genitourinary: Negative for dysuria.  Musculoskeletal: Negative for back pain, myalgias and neck pain.  Skin: Negative for itching and rash.  Neurological: Negative for dizziness, tingling and tremors.  Endo/Heme/Allergies: Negative for environmental allergies. Does not bruise/bleed easily.  Psychiatric/Behavioral: Negative for depression. The patient is not nervous/anxious.     No Known Allergies  Patient Active Problem List   Diagnosis Date Noted  . Hypomagnesemia 10/13/2018  . Generalized abdominal pain   . C. difficile colitis 10/11/2018  . Diarrhea 10/10/2018  . Nocturnal leg cramps 10/07/2018  . Atrial fibrillation ([place]) 09/26/2018  . Long term (current) use of anticoagulants 09/26/2018  . Hyperlipidemia  09/26/2018  . Constipation due to opioid therapy 09/26/2018  . GAD (generalized anxiety disorder)   . Agitation   . Vulvar cancer ([place]) 09/03/2018  . Goals of care, counseling/discussion 09/03/2018     Past Medical History:  Diagnosis Date  . A-fib ([place])   . Cancer ([facility])    vulva   . Depression   . Diverticulitis   . Hyperlipemia   . Hypertension   . Vulva cancer [facility])      Past Surgical History:  Procedure Laterality Date  . ABDOMINAL HYSTERECTOMY    . CATARACT EXTRACTION, BILATERAL    . KNEE SURGERY     right knee   . PORTA CATH INSERTION N/A 09/04/2018   Procedure: PORTA CATH INSERTION;  Surgeon: Algernon Huxley, MD;  Location: [place] [facility];  Service: Cardiovascular;  Laterality: N/A;  . TEE WITHOUT CARDIOVERSION N/A 09/16/2018   Procedure: TRANSESOPHAGEAL ECHOCARDIOGRAM (TEE);  Surgeon: Nelva Bush, MD;  Location: [facility] ORS;  Service: Cardiovascular;  Laterality: N/A;  . TONSILLECTOMY    . VULVA SURGERY      Social History   Socioeconomic History  . Marital status: Widowed    Spouse name: Not on file  . Number of children: 2  . Years of education: Not on file  . Highest education level: Not on file  Occupational History  . Not on file  Social Needs  . Financial resource strain: Not on file  . Food insecurity:    Worry: Not on file    Inability: Not on file  . Transportation needs:    Medical: Not on file    Non-medical: Not on file  Tobacco Use  . Smoking status: Former Smoker    Packs/day: 1.00    Years: 20.00    Pack years:  20.00    Types: Cigarettes    Last attempt to quit: 08/29/1993    Years since quitting: 25.1  . Smokeless tobacco: Never Used  Substance and Sexual Activity  . Alcohol use: Not Currently  . Drug use: Never  . Sexual activity: Not Currently  Lifestyle  . Physical activity:    Days per week: Not on file    Minutes per session: Not on file  . Stress: Not on file  Relationships  . Social connections:    Talks on  phone: Not on file    Gets together: Not on file    Attends religious service: Not on file    Active member of club or organization: Not on file    Attends meetings of clubs or organizations: Not on file    Relationship status: Not on file  . Intimate partner violence:    Fear of current or ex partner: Not on file    Emotionally abused: Not on file    Physically abused: Not on file    Forced sexual activity: Not on file  Other Topics Concern  . Not on file  Social History Narrative  . Not on file     Family History  Problem Relation Age of Onset  . Alzheimer's disease Mother   . Heart disease Father      Current Facility-Administered Medications:  .  0.9 %  sodium chloride infusion, , Intravenous, Continuous, Earlie Server, MD, Last Rate: 100 mL/hr at 10/14/18 6761 .  acetaminophen (TYLENOL) tablet 650 mg, 650 mg, Oral, Q6H PRN **OR** acetaminophen (TYLENOL) suppository 650 mg, 650 mg, Rectal, Q6H PRN, Mayo, Pete Pelt, MD .  feeding supplement (ENSURE ENLIVE) (ENSURE ENLIVE) liquid 237 mL, 237 mL, Oral, TID BM, Epifanio Lesches, MD, 237 mL at 10/13/18 1436 .  fentaNYL (DURAGESIC - dosed mcg/hr) patch 25 mcg, 25 mcg, Transdermal, Q72H, Mayo, Pete Pelt, MD, 25 mcg at 10/13/18 0935 .  gabapentin (NEURONTIN) capsule 300 mg, 300 mg, Oral, QHS, Mayo, Pete Pelt, MD, 300 mg at 10/13/18 2141 .  Gerhardt's butt cream, , Topical, TID, Epifanio Lesches, MD .  HYDROmorphone (DILAUDID) injection 0.5 mg, 0.5 mg, Intravenous, Q4H PRN, Mayo, Pete Pelt, MD, 0.5 mg at 10/13/18 1500 .  mirtazapine (REMERON) tablet 7.5 mg, 7.5 mg, Oral, QHS, Earlie Server, MD, 7.5 mg at 10/13/18 2141 .  multivitamin with minerals tablet 1 tablet, 1 tablet, Oral, Daily, Epifanio Lesches, MD, 1 tablet at 10/14/18 1025 .  ondansetron (ZOFRAN) tablet 4 mg, 4 mg, Oral, Q6H PRN **OR** ondansetron (ZOFRAN) injection 4 mg, 4 mg, Intravenous, Q6H PRN, Mayo, Pete Pelt, MD .  oxyCODONE (Oxy IR/ROXICODONE) immediate release  tablet 5 mg, 5 mg, Oral, Q4H PRN, Mayo, Pete Pelt, MD, 5 mg at 10/13/18 2141 .  pravastatin (PRAVACHOL) tablet 40 mg, 40 mg, Oral, Daily, Mayo, Pete Pelt, MD, 40 mg at 10/14/18 1025 .  prochlorperazine (COMPAZINE) tablet 10 mg, 10 mg, Oral, Q6H PRN, Mayo, Pete Pelt, MD .  vancomycin (VANCOCIN) 50 mg/mL oral solution 125 mg, 125 mg, Oral, QID, Mayo, Pete Pelt, MD, 125 mg at 10/14/18 1025 .  vitamin B-12 (CYANOCOBALAMIN) tablet 1,000 mcg, 1,000 mcg, Oral, Daily, Mayo, Pete Pelt, MD, 1,000 mcg at 10/14/18 1025   Physical exam:  Vitals:   10/13/18 2113 10/14/18 0539 10/14/18 0655 10/14/18 0915  BP: (!) 98/58 (!) 85/44 (!) 92/56 107/64  Pulse: 95 98 96 88  Resp: 18 20  16   Temp: 98.9 F (37.2 C) 99.4 F (37.4  C)  98.1 F (36.7 C)  TempSrc: Oral Oral  Oral  SpO2: 96% 94%  94%    Constitutional: NAD HENT:  Head: Normocephalic and atraumatic.  Dry oral mucosa  Eyes: Pupils are equal, round, and reactive to light. EOM are normal.  Neck: Normal range of motion. Neck supple.  Cardiovascular: Normal rate and regular rhythm.  Pulmonary/Chest: Effort normal. No respiratory distress. She exhibits no tenderness.  Abdominal: Soft. She exhibits no mass. There is tenderness.  Lower abdomen tenderness with palpation.   Musculoskeletal: Normal range of motion. She exhibits no edema.  Lymphadenopathy:    She has no cervical adenopathy.  Neurological: She is alert.  Skin: Skin is warm and dry. No erythema.  Psychiatric: less anxious today.  Gyn : Left vulvar ulcerated mass extending to the Vagina   CMP Latest Ref Rng & Units 10/12/2018  Glucose 70 - 99 mg/dL 99  BUN 8 - 23 mg/dL 26(H)  Creatinine 0.44 - 1.00 mg/dL 0.82  Sodium 135 - 145 mmol/L 137  Potassium 3.5 - 5.1 mmol/L 4.3  Chloride 98 - 111 mmol/L 110  CO2 22 - 32 mmol/L 24  Calcium 8.9 - 10.3 mg/dL 7.5(L)  Total Protein 6.5 - 8.1 g/dL -  Total Bilirubin 0.3 - 1.2 mg/dL -  Alkaline Phos 38 - 126 U/L -  AST 15 - 41 U/L -  ALT 0 - 44  U/L -   CBC Latest Ref Rng & Units 10/14/2018  WBC 4.0 - 10.5 K/uL 22.1(H)  Hemoglobin 12.0 - 15.0 g/dL 9.5(L)  Hematocrit 36.0 - 46.0 % 30.9(L)  Platelets 150 - 400 K/uL 244   RADIOGRAPHIC STUDIES: I have personally reviewed the radiological images as listed and agreed with the findings in the report.  Ct Head Wo Contrast  Result Date: 09/29/2018 CLINICAL DATA:  Syncopal episode, dizziness, weakness, fall and head trauma EXAM: CT HEAD WITHOUT CONTRAST TECHNIQUE: Contiguous axial images were obtained from the base of the skull through the vertex without intravenous contrast. COMPARISON:  09/15/2018 FINDINGS: Brain: Stable atrophy pattern and chronic white matter microvascular ischemic changes throughout both cerebral hemispheres. No acute intracranial hemorrhage, new infarction, mass lesion, midline shift, herniation, hydrocephalus, or extra-axial fluid collection. No focal mass effect or edema. Cisterns are patent. Cerebellar atrophy as well. Vascular: Intracranial atherosclerosis.  No hyperdense vessel. Skull: Normal. Negative for fracture or focal lesion. Sinuses/Orbits: No acute orbital finding. Minor right ethmoid mucosal thickening. Other sinuses visualized are clear. Mastoids are clear. Other: None. IMPRESSION: Stable atrophy pattern and chronic white matter microvascular ischemic changes. No acute intracranial abnormality by noncontrast CT. Electronically Signed   By: Jerilynn Mages.  Shick M.D.   On: 09/29/2018 21:08   Ct Head Wo Contrast  Result Date: 09/15/2018 CLINICAL DATA:  76 y/o F; anxiety, agitation, possible personality change. Rule out intracranial process. History of vulvar cancer. EXAM: CT HEAD WITHOUT CONTRAST TECHNIQUE: Contiguous axial images were obtained from the base of the skull through the vertex without intravenous contrast. COMPARISON:  None. FINDINGS: Brain: No evidence of acute infarction, hemorrhage, hydrocephalus, extra-axial collection or mass lesion/mass effect. Nonspecific  white matter hypodensities are compatible with mild chronic microvascular ischemic changes. Mild volume loss of the brain for age. Vascular: Calcific atherosclerosis of the carotid siphons. No hyperdense vessel identified. Skull: Normal. Negative for fracture or focal lesion. Sinuses/Orbits: No acute finding. Other: None. IMPRESSION: 1. No acute intracranial abnormality identified. 2. Mild for age chronic microvascular ischemic changes and volume loss of the brain. Electronically Signed  By: Kristine Garbe M.D.   On: 09/15/2018 18:52   Ct Abdomen Pelvis W Contrast  Result Date: 10/13/2018 CLINICAL DATA:  Locally advanced vulvar cancer. Now with diarrhea and diffuse abdominal discomfort. EXAM: CT ABDOMEN AND PELVIS WITH CONTRAST TECHNIQUE: Multidetector CT imaging of the abdomen and pelvis was performed using the standard protocol following bolus administration of intravenous contrast. CONTRAST:  25mL OMNIPAQUE IOHEXOL 300 MG/ML  SOLN COMPARISON:  None. FINDINGS: Lower chest: Large hiatal hernia. Small bilateral pleural effusions. 2-3 mm pulmonary nodule noted at the right base (1/4). Hepatobiliary: No focal abnormality within the liver parenchyma. There is no evidence for gallstones, gallbladder wall thickening, or pericholecystic fluid. No intrahepatic or extrahepatic biliary dilation. Pancreas: No focal mass lesion. No dilatation of the main duct. No intraparenchymal cyst. No peripancreatic edema. Spleen: No splenomegaly. Tiny low-density subcapsular focus posterior spleen is likely a cyst or pseudocyst. Adrenals/Urinary Tract: No adrenal nodule or mass. Tiny hypoattenuating lesions in each kidney are too small to characterize. No evidence for hydroureter. Mild circumferential bladder wall thickening evident despite underdistention. Stomach/Bowel: Large hiatal hernia with nearly the entire stomach contained in the chest. Duodenum is normally positioned as is the ligament of Treitz. No small bowel  wall thickening. No small bowel dilatation. Terminal ileum unremarkable. The appendix is not visualized, but there is no edema or inflammation in the region of the cecum. Right colon is stool-filled. Left colon shows irregular circumferential wall thickening, advanced in the sigmoid colon. There is associated pericolonic edema/inflammation. Prominent stool volume noted in the rectum. Vascular/Lymphatic: There is abdominal aortic atherosclerosis without aneurysm. There is no gastrohepatic or hepatoduodenal ligament lymphadenopathy. No intraperitoneal or retroperitoneal lymphadenopathy. No pelvic sidewall lymphadenopathy. Reproductive: Uterus surgically absent.  There is no adnexal mass. Other: Small volume intraperitoneal free fluid. Musculoskeletal: No worrisome lytic or sclerotic osseous abnormality. Diffuse body wall edema evident. IMPRESSION: 1. Marked edema and irregularity of the colonic wall, most prominently in the left colon. This is associated with substantial pericolonic edema/inflammation. Imaging features are compatible with the reported history of C diff colitis. 2. Small volume intraperitoneal free fluid with diffuse body wall edema. 3. Very large hiatal hernia. 4. 2-3 mm pulmonary nodule at the right lung base. No follow-up needed if patient is low-risk. Non-contrast chest CT can be considered in 12 months if patient is high-risk. This recommendation follows the consensus statement: Guidelines for Management of Incidental Pulmonary Nodules Detected on CT Images: From the Fleischner Society 2017; Radiology 2017; 284:228-243. 5.  Aortic Atherosclerois (ICD10-170.0) Electronically Signed   By: Misty Stanley M.D.   On: 10/13/2018 18:11    Assessment and plan-  Patient is a 77 y.o. female with history of recurrent locally advanced vulvar cancer, currently on chemotherapy, A. fib on Coumadin, hypertension, anxiety/depression presents for evaluation of abdominal discomfort and C. difficile  colitis.  #C. difficile colitis/diarrhea, continue with oral vancomycin 125 mg daily. Diarrhea is less frequent.  CT abdomen pelvis noted, consistent with colitis.   #Hypotension, likely due to hypovolemia secondary to ongoing diarrhea. continue with IV fluid for hydration.   #Recurrent vulvar cancer, outpatient follow-up at the cancer center for evaluation of next cycle of chemotherapy.  She is not due for next round of chemotherapy until 10/24/2018.  # neoplasm related pain: continue Fentanyl patch and oxycodone 5mg  Q4-6 hours as needed.  # Recent bacteremia,s/p IV antibiotics course.  recent peripheral blood culture negative.  # Anxiety and depression. Added remeron QHS last night. Less anxious today. Continue to monitor.   #  Supratherapeutic INR: hold coumadin. INR trending up. I gave her a dose of vitamin K 1mg  IM. No signs of bleeding.  Continue monitor INR daily.   Thank you for allowing me to participate in the care of this patient.   Earlie Server, MD, PhD Hematology Oncology [facility] at [facility] Pager- [phone] 10/14/2018

## 2018-10-14 NOTE — Progress Notes (Signed)
[place] [facility] [place] NAME: Virginia Murray    MR#:  [id number]  DATE OF BIRTH:  1941-12-18  SUBJECTIVE: D still has abdominal pain, diarrhea, poor p.o. intake.  Marland Kitchen Hypotensive last night received fluid bolus.  INR is around 8, received vitamin K.  No evidence of bleeding.  CHIEF COMPLAINT:   Chief Complaint  Patient presents with  . Diarrhea  . Vaginal Bleeding   Appears uncomfortable because of the pain in the vulvar area and abdomen. REVIEW OF SYSTEMS:   ROS CONSTITUTIONAL: No fever.  Has generalized weakness. EYES: No blurred or double vision.  EARS, NOSE, AND THROAT: No tinnitus or ear pain.  RESPIRATORY: No cough, shortness of breath, wheezing or hemoptysis.  CARDIOVASCULAR: No chest pain, orthopnea, edema.  GASTROINTESTINAL: Diarrhea, abdominal pain, poor p.o. Intake  GENITOURINARY: No dysuria, hematuria.  ENDOCRINE: No polyuria, nocturia,  HEMATOLOGY: No anemia, easy bruising or bleeding SKIN: No rash or lesion. MUSCULOSKELETAL: No joint pain or arthritis.   NEUROLOGIC: No tingling, numbness, weakness.  PSYCHIATRY: No anxiety or depression.   DRUG ALLERGIES:  No Known Allergies  VITALS:  Blood pressure 107/64, pulse 88, temperature 98.1 F (36.7 C), temperature source Oral, resp. rate 16, SpO2 94 %.  PHYSICAL EXAMINATION:  GENERAL:  77 y.o.-year-old patient lying in the bed with no acute distress.  Appears cachectic. EYES: Pupils equal, round, reactive to light and accommodation. No scleral icterus. Extraocular muscles intact.  HEENT: Head atraumatic, normocephalic. Oropharynx and nasopharynx clear.  NECK:  Supple, no jugular venous distention. No thyroid enlargement, no tenderness.  LUNGS: Normal breath sounds bilaterally, no wheezing, rales,rhonchi or crepitation. No use of accessory muscles of respiration.  CARDIOVASCULAR: S1, S2 normal. No murmurs, rubs, or gallops.  ABDOMEN:generalized abdominal tenderness  present more so in the right lower quadrant, no rebound tenderness, bowel sounds diminished, no organomegaly. EXTREMITIES: No pedal edema, cyanosis, or clubbing.  NEUROLOGIC: Cranial nerves II through XII are intact. Muscle strength 5/5 in all extremities. Sensation intact. Gait not checked.  PSYCHIATRIC: The patient is alert and oriented x 3.  SKIN: Noted to have excoriation in the vaginal area and also rectal area.   LABORATORY PANEL:   CBC Recent Labs  Lab 10/14/18 0435  WBC 22.1*  HGB 9.5*  HCT 30.9*  PLT 244   ------------------------------------------------------------------------------------------------------------------  Chemistries  Recent Labs  Lab 10/11/18 1413 10/12/18 0428  NA 140 137  K 4.6 4.3  CL 113* 110  CO2 22 24  GLUCOSE 97 99  BUN 30* 26*  CREATININE 0.70 0.82  CALCIUM 8.1* 7.5*  AST 16  --   ALT 11  --   ALKPHOS 54  --   BILITOT 0.5  --    ------------------------------------------------------------------------------------------------------------------  Cardiac Enzymes No results for input(s): TROPONINI in the last 168 hours. ------------------------------------------------------------------------------------------------------------------  RADIOLOGY:  Ct Abdomen Pelvis W Contrast  Result Date: 10/13/2018 CLINICAL DATA:  Locally advanced vulvar cancer. Now with diarrhea and diffuse abdominal discomfort. EXAM: CT ABDOMEN AND PELVIS WITH CONTRAST TECHNIQUE: Multidetector CT imaging of the abdomen and pelvis was performed using the standard protocol following bolus administration of intravenous contrast. CONTRAST:  52mL OMNIPAQUE IOHEXOL 300 MG/ML  SOLN COMPARISON:  None. FINDINGS: Lower chest: Large hiatal hernia. Small bilateral pleural effusions. 2-3 mm pulmonary nodule noted at the right base (1/4). Hepatobiliary: No focal abnormality within the liver parenchyma. There is no evidence for gallstones, gallbladder wall thickening, or pericholecystic  fluid. No intrahepatic or extrahepatic biliary dilation. Pancreas:  No focal mass lesion. No dilatation of the main duct. No intraparenchymal cyst. No peripancreatic edema. Spleen: No splenomegaly. Tiny low-density subcapsular focus posterior spleen is likely a cyst or pseudocyst. Adrenals/Urinary Tract: No adrenal nodule or mass. Tiny hypoattenuating lesions in each kidney are too small to characterize. No evidence for hydroureter. Mild circumferential bladder wall thickening evident despite underdistention. Stomach/Bowel: Large hiatal hernia with nearly the entire stomach contained in the chest. Duodenum is normally positioned as is the ligament of Treitz. No small bowel wall thickening. No small bowel dilatation. Terminal ileum unremarkable. The appendix is not visualized, but there is no edema or inflammation in the region of the cecum. Right colon is stool-filled. Left colon shows irregular circumferential wall thickening, advanced in the sigmoid colon. There is associated pericolonic edema/inflammation. Prominent stool volume noted in the rectum. Vascular/Lymphatic: There is abdominal aortic atherosclerosis without aneurysm. There is no gastrohepatic or hepatoduodenal ligament lymphadenopathy. No intraperitoneal or retroperitoneal lymphadenopathy. No pelvic sidewall lymphadenopathy. Reproductive: Uterus surgically absent.  There is no adnexal mass. Other: Small volume intraperitoneal free fluid. Musculoskeletal: No worrisome lytic or sclerotic osseous abnormality. Diffuse body wall edema evident. IMPRESSION: 1. Marked edema and irregularity of the colonic wall, most prominently in the left colon. This is associated with substantial pericolonic edema/inflammation. Imaging features are compatible with the reported history of C diff colitis. 2. Small volume intraperitoneal free fluid with diffuse body wall edema. 3. Very large hiatal hernia. 4. 2-3 mm pulmonary nodule at the right lung base. No follow-up needed if  patient is low-risk. Non-contrast chest CT can be considered in 12 months if patient is high-risk. This recommendation follows the consensus statement: Guidelines for Management of Incidental Pulmonary Nodules Detected on CT Images: From the Fleischner Society 2017; Radiology 2017; 284:228-243. 5.  Aortic Atherosclerois (ICD10-170.0) Electronically Signed   By: Misty Stanley M.D.   On: 10/13/2018 18:11    EKG:   Orders placed or performed during the hospital encounter of 09/29/18  . ED EKG  . ED EKG  . EKG    ASSESSMENT AND PLAN:   #1. C. difficile colitis: Continue p.o. vancomycin, continue enteric precautions, p continue p.o. vancomycin, diarrhea is less today.  Continue p.o. vancomycin for total of 14 days.  CT abdomen, pelvis yesterday showed Marked edema and irregularity of colonic wall consistent with colitis.  Continue IV fluids, antibiotics.  Patient p.o. intake is still poor.  Does have a hypotension episode so continue IV fluids. 2.  Chronic hypotension: Continue IV fluids, continue to hold Norvasc, atenolol, benazepril.  Give fluid bolus 500 cc NS because of hypotension now. #3 .proximal H fibrillation: Hold Coumadin as INR is still high at 5.3.  Hemoglobin stable around 9.5 without evidence of active bleeding.  #4 .recent GBS bacteremia, hospitalized from 9/14 , 9/18, patient received Rocephin for 2 weeks via port, repeat blood cultures from 101/1 at oncology office did not show any growth for 24 hours. 5.  Recurrent vulvar cancer, patient is undergoing chemotherapy, history of radical vulvectomy in 2007, follows up with Dr. Tasia Catchings as an outpatient, according to Dr. Collie Siad note patient is not a candidate for further gynecological surgery due to her poor performance status.  Dr. you discussed with the event oncology Dr. Theora Gianotti.  Patient seen by palliative care team Josh as an outpatient.  Patient is still getting chemotherapy with carboplatin, Taxol, has recurrent vulvar cancer with  involvement of vagina, rectum, seen by Dr. Tasia Catchings yesterday.   #6. hyperlipidemia: Continue statins. Plan for palliative  care consult, oncology consult today Prognosis poor, high risk for cardiac arrest, CODE STATUS DNR Consult palliative care again. More than 50% time spent in counseling, coordination of care. All the records are reviewed and case discussed with Care Management/Social Workerr. Management plans discussed with the patient, family and they are in agreement.  CODE STATUS: DNR  TOTAL TIME TAKING CARE OF THIS PATIENT: 40 minutes.   POSSIBLE D/C IN 1-2 DAYS, DEPENDING ON CLINICAL CONDITION.   Epifanio Lesches M.D on 10/14/2018 at 2:11 PM  Between 7am to 6pm - Pager - [phone]  After 6pm go to [email or web address] - password [email or web address] [facility]  [place] [facility]  Office  [phone]  CC: Primary care physician; Pleas Koch, NP   Note: This dictation was prepared with Dragon dictation along with smaller phrase technology. Any transcriptional errors that result from this process are unintentional.

## 2018-10-14 NOTE — Consult Note (Signed)
[place] for warfarin dosing Indication: atrial fibrillation  No Known Allergies  Labs: Recent Labs    10/11/18 1413  10/12/18 0428 10/13/18 0406 10/14/18 0435  HGB 9.7*  --  8.7* 9.5* 9.5*  HCT 30.9*  --  28.4* 31.1* 30.9*  PLT 210  --  202 207 244  LABPROT  --    < > 60.0* 48.2* 72.5*  INR  --    < > 7.01* 5.30* 8.94*  CREATININE 0.70  --  0.82  --   --    < > = values in this interval not displayed.    Estimated Creatinine Clearance: 46.7 mL/min (by C-G formula based on SCr of 0.82 mg/dL).   Assessment: 77 year-old female female on warfarin 3mg  daily PTA  On a recent admission here in September she exhibited the following response:  DATE   INR      Dose   9/14     1.84     3 mg  9/15     2.73     hold  9/16     2.36     2mg  9/17     2.67     2mg    10/12   5.97     Held 10/13   7.01     Held 10/14   5.3       Held 10/15   8.94     Held - provider ordered IV VitK 1mg  once.  Goal of Therapy:  INR 2-3 Monitor platelets by anticoagulation protocol: Yes   Plan:  Her INR remains supratherapeutic today at 8.94. No warfarin today.  IV phytonadione 1 mg once was ordered for the patient by provider. Follow up daily INR and pharmacy will begin warfarin when her INR is closer to goal range. Per RN no s/sx of bleeding noted at this time. Will order CBC for AM to monitor Hgb.   Pharmacy will continue to follow.   Forrest Moron, PharmD 10/14/2018,11:06 AM

## 2018-10-15 ENCOUNTER — Inpatient Hospital Stay: Payer: Medicare Other

## 2018-10-15 LAB — CULTURE, BLOOD (ROUTINE X 2)
Culture: NO GROWTH
Special Requests: ADEQUATE

## 2018-10-15 LAB — PROTIME-INR
INR: 3.53
PROTHROMBIN TIME: 34.8 s — AB (ref 11.4–15.2)

## 2018-10-15 LAB — CBC
HCT: 35 % — ABNORMAL LOW (ref 36.0–46.0)
HEMOGLOBIN: 10.6 g/dL — AB (ref 12.0–15.0)
MCH: 25.6 pg — AB (ref 26.0–34.0)
MCHC: 30.3 g/dL (ref 30.0–36.0)
MCV: 84.5 fL (ref 80.0–100.0)
Platelets: 248 10*3/uL (ref 150–400)
RBC: 4.14 MIL/uL (ref 3.87–5.11)
RDW: 17.2 % — ABNORMAL HIGH (ref 11.5–15.5)
WBC: 20.9 10*3/uL — ABNORMAL HIGH (ref 4.0–10.5)
nRBC: 0 % (ref 0.0–0.2)

## 2018-10-15 MED ORDER — ALPRAZOLAM 0.25 MG PO TABS
0.2500 mg | ORAL_TABLET | Freq: Two times a day (BID) | ORAL | Status: DC | PRN
  Administered 2018-10-17 – 2018-10-18 (×2): 0.25 mg via ORAL
  Filled 2018-10-15 (×4): qty 1

## 2018-10-15 MED ORDER — SODIUM CHLORIDE 0.9 % IV BOLUS
250.0000 mL | Freq: Once | INTRAVENOUS | Status: AC
  Administered 2018-10-15: 10:00:00 250 mL via INTRAVENOUS

## 2018-10-15 NOTE — Progress Notes (Signed)
   10/15/18 2000  Clinical Encounter Type  Visited With Patient;Health care provider  Visit Type Initial  Referral From Nurse  Consult/Referral To Chaplain  Spiritual Encounters  Spiritual Needs Emotional;Grief support   Chaplain responded to order for prayer.  Chaplain had spoken with staff who felt patient could use someone to talk to.  Patient expressed hopes of family visiting tonight and had chaplain check the hallway periodically to see if they were coming.  Conversation surrounding death of patient's spouse and patient's grief reaction.  Chaplain utilized active and reflective listening while providing grief education to assist in normalizing patient's experience.  Patient also shared thoughts and feelings regarding decision to relocate and move in with son after her spouse's death.  Patient became tearful when chaplain invited her to talk about her spouse and expressed that sometimes she can talk about him and sometimes she can't.  Patient expressed feelings of fatigue and said that she wanted to sleep.  Chaplain spoke of ongoing availability and encouraged patient to page chaplain as needed.

## 2018-10-15 NOTE — Progress Notes (Signed)
PT Cancellation Note  Patient Details Name: Virginia Murray MRN: [id number] DOB: [birth year]   Cancelled Treatment:    Reason Eval/Treat Not Completed: Patient declined, no reason specified.  Attempted to see pt for PT evaluation but pt verbally aggressive toward this PT and refusing to participate.  With attempt to take pt's history she becomes annoyed very quickly saying, "why do I have to answer these?", "fine, I live in a cardboard box".  Provided education to pt regarding the role of PT, the reasoning for the questions, and the importance of physical activity.  Pt saying, "I will get up and walk around when I get home, I'm not dumb."  Pt ultimately rude and refusing PT, thus will attempt working with pt again next date.     Collie Siad PT, DPT 10/15/2018, 2:06 PM

## 2018-10-15 NOTE — Care Management Important Message (Signed)
Important Message  Patient Details  Name: Virginia Murray MRN: [id number] Date of Birth: [birth year]   Medicare Important Message Given:  Yes    Shelbie Ammons, RN 10/15/2018, 1:08 PM

## 2018-10-15 NOTE — Progress Notes (Signed)
Hematology/Oncology Progress Note [facility] Telephone:([phone] [phone]  Patient Care Team: Pleas Koch, NP as PCP - General (Internal Medicine) Clent Jacks, RN as Registered Nurse Earlie Server, MD as Medical Oncologist (Medical Oncology) Gillis Ends, MD as Consulting Physician (Gynecologic Oncology)   Name of the patient: Virginia Murray  [id number]  [birth year]  Date of visit: 10/15/18   INTERVAL HISTORY-  No acute overnight events.  Blood pressure better.  Less episodes of diarrhea.  Abdominal pain has also improved. She was lying the bed comfortably.   Review of systems- Review of Systems  Constitutional: Positive for malaise/fatigue. Negative for chills, fever and weight loss.  HENT: Negative for nosebleeds and sore throat.   Eyes: Negative for double vision, photophobia and redness.  Respiratory: Negative for cough, shortness of breath and wheezing.   Cardiovascular: Negative for chest pain, palpitations and orthopnea.  Gastrointestinal: Positive for diarrhea. Negative for abdominal pain, blood in stool, nausea and vomiting.  Genitourinary: Negative for dysuria.  Musculoskeletal: Negative for back pain, myalgias and neck pain.  Skin: Negative for itching and rash.  Neurological: Negative for dizziness, tingling and tremors.  Endo/Heme/Allergies: Negative for environmental allergies. Does not bruise/bleed easily.  Psychiatric/Behavioral: Negative for depression. The patient is not nervous/anxious.     No Known Allergies  Patient Active Problem List   Diagnosis Date Noted  . Depression   . Elevated INR   . Hypomagnesemia 10/13/2018  . Generalized abdominal pain   . C. difficile colitis 10/11/2018  . Diarrhea 10/10/2018  . Nocturnal leg cramps 10/07/2018  . Atrial fibrillation ([place]) 09/26/2018  . Long term (current) use of anticoagulants 09/26/2018  . Hyperlipidemia 09/26/2018  . Constipation due to  opioid therapy 09/26/2018  . Generalized anxiety disorder   . Agitation   . Vulvar cancer ([place]) 09/03/2018  . Goals of care, counseling/discussion 09/03/2018     Past Medical History:  Diagnosis Date  . A-fib ([place])   . Cancer (HCC)    vulva   . Depression   . Diverticulitis   . Hyperlipemia   . Hypertension   . Vulva cancer [facility])      Past Surgical History:  Procedure Laterality Date  . ABDOMINAL HYSTERECTOMY    . CATARACT EXTRACTION, BILATERAL    . KNEE SURGERY     right knee   . PORTA CATH INSERTION N/A 09/04/2018   Procedure: PORTA CATH INSERTION;  Surgeon: Algernon Huxley, MD;  Location: [place] [facility];  Service: Cardiovascular;  Laterality: N/A;  . TEE WITHOUT CARDIOVERSION N/A 09/16/2018   Procedure: TRANSESOPHAGEAL ECHOCARDIOGRAM (TEE);  Surgeon: Nelva Bush, MD;  Location: [facility] OR[facility];  Service: Cardiovascular;  Laterality: N/A;  . TONSILLECTOMY    . VULVA SURGERY      Social History   Socioeconomic History  . Marital status: Widowed    Spouse name: Not on file  . Number of children: 2  . Years of education: Not on file  . Highest education level: Not on file  Occupational History  . Not on file  Social Needs  . Financial resource strain: Not on file  . Food insecurity:    Worry: Not on file    Inability: Not on file  . Transportation needs:    Medical: Not on file    Non-medical: Not on file  Tobacco Use  . Smoking status: Former Smoker    Packs/day: 1.00    Years: 20.00    Pack years: 20.00  Types: Cigarettes    Last attempt to quit: 08/29/1993    Years since quitting: 25.1  . Smokeless tobacco: Never Used  Substance and Sexual Activity  . Alcohol use: Not Currently  . Drug use: Never  . Sexual activity: Not Currently  Lifestyle  . Physical activity:    Days per week: Not on file    Minutes per session: Not on file  . Stress: Not on file  Relationships  . Social connections:    Talks on phone: Not on file    Gets together: Not on  file    Attends religious service: Not on file    Active member of club or organization: Not on file    Attends meetings of clubs or organizations: Not on file    Relationship status: Not on file  . Intimate partner violence:    Fear of current or ex partner: Not on file    Emotionally abused: Not on file    Physically abused: Not on file    Forced sexual activity: Not on file  Other Topics Concern  . Not on file  Social History Narrative  . Not on file     Family History  Problem Relation Age of Onset  . Alzheimer's disease Mother   . Heart disease Father      Current Facility-Administered Medications:  .  0.9 %  sodium chloride infusion, , Intravenous, Continuous, Earlie Server, MD, Last Rate: 100 mL/hr at 10/15/18 0953 .  acetaminophen (TYLENOL) tablet 650 mg, 650 mg, Oral, Q6H PRN **OR** acetaminophen (TYLENOL) suppository 650 mg, 650 mg, Rectal, Q6H PRN, Mayo, Pete Pelt, MD .  feeding supplement (ENSURE ENLIVE) (ENSURE ENLIVE) liquid 237 mL, 237 mL, Oral, TID BM, Epifanio Lesches, MD, 237 mL at 10/13/18 1436 .  fentaNYL (DURAGESIC - dosed mcg/hr) patch 25 mcg, 25 mcg, Transdermal, Q72H, Mayo, Pete Pelt, MD, 25 mcg at 10/13/18 0935 .  gabapentin (NEURONTIN) capsule 300 mg, 300 mg, Oral, QHS, Mayo, Pete Pelt, MD, 300 mg at 10/13/18 2141 .  Gerhardt's butt cream, , Topical, TID, Epifanio Lesches, MD .  HYDROmorphone (DILAUDID) injection 0.5 mg, 0.5 mg, Intravenous, Q4H PRN, Mayo, Pete Pelt, MD, 0.5 mg at 10/15/18 0843 .  mirtazapine (REMERON) tablet 7.5 mg, 7.5 mg, Oral, QHS, Earlie Server, MD, 7.5 mg at 10/14/18 2220 .  multivitamin with minerals tablet 1 tablet, 1 tablet, Oral, Daily, Epifanio Lesches, MD, 1 tablet at 10/15/18 0843 .  ondansetron (ZOFRAN) tablet 4 mg, 4 mg, Oral, Q6H PRN **OR** ondansetron (ZOFRAN) injection 4 mg, 4 mg, Intravenous, Q6H PRN, Mayo, Pete Pelt, MD .  oxyCODONE (Oxy IR/ROXICODONE) immediate release tablet 5 mg, 5 mg, Oral, Q4H PRN, Mayo, Pete Pelt, MD, 5 mg at 10/13/18 2141 .  pravastatin (PRAVACHOL) tablet 40 mg, 40 mg, Oral, Daily, Mayo, Pete Pelt, MD, 40 mg at 10/15/18 0842 .  prochlorperazine (COMPAZINE) tablet 10 mg, 10 mg, Oral, Q6H PRN, Mayo, Pete Pelt, MD .  vancomycin (VANCOCIN) 50 mg/mL oral solution 125 mg, 125 mg, Oral, QID, Mayo, Pete Pelt, MD, 125 mg at 10/15/18 1510 .  vitamin B-12 (CYANOCOBALAMIN) tablet 1,000 mcg, 1,000 mcg, Oral, Daily, Mayo, Pete Pelt, MD, 1,000 mcg at 10/15/18 0843   Physical exam:  Vitals:   10/15/18 0913 10/15/18 0914 10/15/18 1158 10/15/18 1219  BP: (!) 87/41 90/64 (!) 92/53 109/90  Pulse: 97 100 84 94  Resp:   20   Temp: 98.3 F (36.8 C)  98.7 F (37.1 C)   TempSrc:  Oral  Oral   SpO2:   98%     Constitutional: NAD HENT:  Head: Normocephalic and atraumatic.    Moist oral mucosa  Eyes: Pupils are equal, round, and reactive to light. EOM are normal.  Neck: Normal range of motion. Neck supple.  Cardiovascular: Normal rate and regular rhythm.  Pulmonary/Chest: Effort normal. No respiratory distress. She exhibits no tenderness.  Abdominal: Soft. She exhibits no mass.  No tenderness  Musculoskeletal: Normal range of motion. She exhibits no edema.  Lymphadenopathy:    She has no cervical adenopathy.  Neurological: She is alert.  Skin: Skin is warm and dry. No erythema.  Psychiatric: less anxious today.   Gyn : Left vulvar ulcerated mass extending to the Vagina   CMP Latest Ref Rng & Units 10/12/2018  Glucose 70 - 99 mg/dL 99  BUN 8 - 23 mg/dL 26(H)  Creatinine 0.44 - 1.00 mg/dL 0.82  Sodium 135 - 145 mmol/L 137  Potassium 3.5 - 5.1 mmol/L 4.3  Chloride 98 - 111 mmol/L 110  CO2 22 - 32 mmol/L 24  Calcium 8.9 - 10.3 mg/dL 7.5(L)  Total Protein 6.5 - 8.1 g/dL -  Total Bilirubin 0.3 - 1.2 mg/dL -  Alkaline Phos 38 - 126 U/L -  AST 15 - 41 U/L -  ALT 0 - 44 U/L -   CBC Latest Ref Rng & Units 10/15/2018  WBC 4.0 - 10.5 K/uL 20.9(H)  Hemoglobin 12.0 - 15.0 g/dL 10.6(L)    Hematocrit 36.0 - 46.0 % 35.0(L)  Platelets 150 - 400 K/uL 248   RADIOGRAPHIC STUDIES: I have personally reviewed the radiological images as listed and agreed with the findings in the report.  Ct Head Wo Contrast  Result Date: 09/29/2018 CLINICAL DATA:  Syncopal episode, dizziness, weakness, fall and head trauma EXAM: CT HEAD WITHOUT CONTRAST TECHNIQUE: Contiguous axial images were obtained from the base of the skull through the vertex without intravenous contrast. COMPARISON:  09/15/2018 FINDINGS: Brain: Stable atrophy pattern and chronic white matter microvascular ischemic changes throughout both cerebral hemispheres. No acute intracranial hemorrhage, new infarction, mass lesion, midline shift, herniation, hydrocephalus, or extra-axial fluid collection. No focal mass effect or edema. Cisterns are patent. Cerebellar atrophy as well. Vascular: Intracranial atherosclerosis.  No hyperdense vessel. Skull: Normal. Negative for fracture or focal lesion. Sinuses/Orbits: No acute orbital finding. Minor right ethmoid mucosal thickening. Other sinuses visualized are clear. Mastoids are clear. Other: None. IMPRESSION: Stable atrophy pattern and chronic white matter microvascular ischemic changes. No acute intracranial abnormality by noncontrast CT. Electronically Signed   By: Jerilynn Mages.  Shick M.D.   On: 09/29/2018 21:08   Ct Head Wo Contrast  Result Date: 09/15/2018 CLINICAL DATA:  77 y/o F; anxiety, agitation, possible personality change. Rule out intracranial process. History of vulvar cancer. EXAM: CT HEAD WITHOUT CONTRAST TECHNIQUE: Contiguous axial images were obtained from the base of the skull through the vertex without intravenous contrast. COMPARISON:  None. FINDINGS: Brain: No evidence of acute infarction, hemorrhage, hydrocephalus, extra-axial collection or mass lesion/mass effect. Nonspecific white matter hypodensities are compatible with mild chronic microvascular ischemic changes. Mild volume loss of the  brain for age. Vascular: Calcific atherosclerosis of the carotid siphons. No hyperdense vessel identified. Skull: Normal. Negative for fracture or focal lesion. Sinuses/Orbits: No acute finding. Other: None. IMPRESSION: 1. No acute intracranial abnormality identified. 2. Mild for age chronic microvascular ischemic changes and volume loss of the brain. Electronically Signed   By: Kristine Garbe M.D.   On: 09/15/2018 18:52  Ct Abdomen Pelvis W Contrast  Result Date: 10/13/2018 CLINICAL DATA:  Locally advanced vulvar cancer. Now with diarrhea and diffuse abdominal discomfort. EXAM: CT ABDOMEN AND PELVIS WITH CONTRAST TECHNIQUE: Multidetector CT imaging of the abdomen and pelvis was performed using the standard protocol following bolus administration of intravenous contrast. CONTRAST:  26mL OMNIPAQUE IOHEXOL 300 MG/ML  SOLN COMPARISON:  None. FINDINGS: Lower chest: Large hiatal hernia. Small bilateral pleural effusions. 2-3 mm pulmonary nodule noted at the right base (1/4). Hepatobiliary: No focal abnormality within the liver parenchyma. There is no evidence for gallstones, gallbladder wall thickening, or pericholecystic fluid. No intrahepatic or extrahepatic biliary dilation. Pancreas: No focal mass lesion. No dilatation of the main duct. No intraparenchymal cyst. No peripancreatic edema. Spleen: No splenomegaly. Tiny low-density subcapsular focus posterior spleen is likely a cyst or pseudocyst. Adrenals/Urinary Tract: No adrenal nodule or mass. Tiny hypoattenuating lesions in each kidney are too small to characterize. No evidence for hydroureter. Mild circumferential bladder wall thickening evident despite underdistention. Stomach/Bowel: Large hiatal hernia with nearly the entire stomach contained in the chest. Duodenum is normally positioned as is the ligament of Treitz. No small bowel wall thickening. No small bowel dilatation. Terminal ileum unremarkable. The appendix is not visualized, but there is  no edema or inflammation in the region of the cecum. Right colon is stool-filled. Left colon shows irregular circumferential wall thickening, advanced in the sigmoid colon. There is associated pericolonic edema/inflammation. Prominent stool volume noted in the rectum. Vascular/Lymphatic: There is abdominal aortic atherosclerosis without aneurysm. There is no gastrohepatic or hepatoduodenal ligament lymphadenopathy. No intraperitoneal or retroperitoneal lymphadenopathy. No pelvic sidewall lymphadenopathy. Reproductive: Uterus surgically absent.  There is no adnexal mass. Other: Small volume intraperitoneal free fluid. Musculoskeletal: No worrisome lytic or sclerotic osseous abnormality. Diffuse body wall edema evident. IMPRESSION: 1. Marked edema and irregularity of the colonic wall, most prominently in the left colon. This is associated with substantial pericolonic edema/inflammation. Imaging features are compatible with the reported history of C diff colitis. 2. Small volume intraperitoneal free fluid with diffuse body wall edema. 3. Very large hiatal hernia. 4. 2-3 mm pulmonary nodule at the right lung base. No follow-up needed if patient is low-risk. Non-contrast chest CT can be considered in 12 months if patient is high-risk. This recommendation follows the consensus statement: Guidelines for Management of Incidental Pulmonary Nodules Detected on CT Images: From the Fleischner Society 2017; Radiology 2017; 284:228-243. 5.  Aortic Atherosclerois (ICD10-170.0) Electronically Signed   By: Misty Stanley M.D.   On: 10/13/2018 18:11    Assessment and plan-  Patient is a 77 y.o. female with history of recurrent locally advanced vulvar cancer, currently on chemotherapy, A. fib on Coumadin, hypertension, anxiety/depression presents for evaluation of abdominal discomfort and C. difficile colitis.  #C. difficile colitis/diarrhea, clinically improving.  Continue with oral vancomycin 12 mg daily.   #Hypotension,  improved.  Continue IV fluid for hydration.Marland Kitchen   #Recurrent vulvar cancer, outpatient follow-up at the cancer center for evaluation of next cycle of chemotherapy.  She is not due for next round of chemotherapy until 10/24/2018.  # neoplasm related pain: Continue fentanyl patch and oxycodone  # Recent bacteremia,s/p IV antibiotics course.  recent peripheral blood culture negative.  # Anxiety and depression.  Clinically improving with Remeron nightly. # Supratherapeutic INR: hold coumadin.   Status post 1 dose of vitamin K 1 mg yesterday.  INR reversed.  3.53 today  Thank you for allowing me to participate in the care of this patient.  We  will continue to follow her inpatient course.  Earlie Server, MD, PhD Hematology Oncology [facility] Pager- [phone] 10/15/2018

## 2018-10-15 NOTE — Progress Notes (Signed)
[place] [facility] [place] NAME: Virginia Murray    MR#:  [id number]  DATE OF BIRTH:  04-09-1941  Less diarrhea, less abdominal pain today, eating little bit more than yesterday.  Spoke with patient daughter-in-law admission over the phone for about 15 minutes yesterday  CHIEF COMPLAINT:   Chief Complaint  Patient presents with  . Diarrhea  . Vaginal Bleeding   Overall appears slightly more comfortable than last 2 days.,  Less diarrhea, less abdominal pain REVIEW OF SYSTEMS:   Review of Systems  Gastrointestinal: Positive for abdominal pain and diarrhea.   CONSTITUTIONAL: No fever.  Has generalized weakness. EYES: No blurred or double vision.  EARS, NOSE, AND THROAT: No tinnitus or ear pain.  RESPIRATORY: No cough, shortness of breath, wheezing or hemoptysis.  CARDIOVASCULAR: No chest pain, orthopnea, edema.  GASTROINTESTINAL: Diarrhea, abdominal pain, poor p.o. Intake  GENITOURINARY: No dysuria, hematuria.  ENDOCRINE: No polyuria, nocturia,  HEMATOLOGY: No anemia, easy bruising or bleeding SKIN: No rash or lesion. MUSCULOSKELETAL: No joint pain or arthritis.   NEUROLOGIC: No tingling, numbness, weakness.  PSYCHIATRY: No anxiety or depression.   DRUG ALLERGIES:  No Known Allergies  VITALS:  Blood pressure 109/90, pulse 94, temperature 98.7 F (37.1 C), temperature source Oral, resp. rate 20, SpO2 98 %.  PHYSICAL EXAMINATION:  GENERAL:  77 y.o.-year-old patient lying in the bed with no acute distress.  Appears cachectic. EYES: Pupils equal, round, reactive to light and accommodation. No scleral icterus. Extraocular muscles intact.  HEENT: Head atraumatic, normocephalic. Oropharynx and nasopharynx clear.  NECK:  Supple, no jugular venous distention. No thyroid enlargement, no tenderness.  LUNGS: Normal breath sounds bilaterally, no wheezing, rales,rhonchi or crepitation. No use of accessory muscles of respiration.   CARDIOVASCULAR: S1, S2 normal. No murmurs, rubs, or gallops.  ABDOMEN:g abdomen soft, bowel sounds diminished, no abdominal tenderness today  eXTREMITIES: No pedal edema, cyanosis, or clubbing.  NEUROLOGIC: Cranial nerves II through XII are intact. Muscle strength 5/5 in all extremities. Sensation intact. Gait not checked.  PSYCHIATRIC: The patient is alert and oriented x 3.  SKIN: Noted to have excoriation in the vaginal area and also rectal area.   LABORATORY PANEL:   CBC Recent Labs  Lab 10/15/18 0500  WBC 20.9*  HGB 10.6*  HCT 35.0*  PLT 248   ------------------------------------------------------------------------------------------------------------------  Chemistries  Recent Labs  Lab 10/11/18 1413 10/12/18 0428  NA 140 137  K 4.6 4.3  CL 113* 110  CO2 22 24  GLUCOSE 97 99  BUN 30* 26*  CREATININE 0.70 0.82  CALCIUM 8.1* 7.5*  AST 16  --   ALT 11  --   ALKPHOS 54  --   BILITOT 0.5  --    ------------------------------------------------------------------------------------------------------------------  Cardiac Enzymes No results for input(s): TROPONINI in the last 168 hours. ------------------------------------------------------------------------------------------------------------------  RADIOLOGY:  Ct Abdomen Pelvis W Contrast  Result Date: 10/13/2018 CLINICAL DATA:  Locally advanced vulvar cancer. Now with diarrhea and diffuse abdominal discomfort. EXAM: CT ABDOMEN AND PELVIS WITH CONTRAST TECHNIQUE: Multidetector CT imaging of the abdomen and pelvis was performed using the standard protocol following bolus administration of intravenous contrast. CONTRAST:  60mL OMNIPAQUE IOHEXOL 300 MG/ML  SOLN COMPARISON:  None. FINDINGS: Lower chest: Large hiatal hernia. Small bilateral pleural effusions. 2-3 mm pulmonary nodule noted at the right base (1/4). Hepatobiliary: No focal abnormality within the liver parenchyma. There is no evidence for gallstones, gallbladder  wall thickening, or pericholecystic fluid. No intrahepatic or extrahepatic biliary  dilation. Pancreas: No focal mass lesion. No dilatation of the main duct. No intraparenchymal cyst. No peripancreatic edema. Spleen: No splenomegaly. Tiny low-density subcapsular focus posterior spleen is likely a cyst or pseudocyst. Adrenals/Urinary Tract: No adrenal nodule or mass. Tiny hypoattenuating lesions in each kidney are too small to characterize. No evidence for hydroureter. Mild circumferential bladder wall thickening evident despite underdistention. Stomach/Bowel: Large hiatal hernia with nearly the entire stomach contained in the chest. Duodenum is normally positioned as is the ligament of Treitz. No small bowel wall thickening. No small bowel dilatation. Terminal ileum unremarkable. The appendix is not visualized, but there is no edema or inflammation in the region of the cecum. Right colon is stool-filled. Left colon shows irregular circumferential wall thickening, advanced in the sigmoid colon. There is associated pericolonic edema/inflammation. Prominent stool volume noted in the rectum. Vascular/Lymphatic: There is abdominal aortic atherosclerosis without aneurysm. There is no gastrohepatic or hepatoduodenal ligament lymphadenopathy. No intraperitoneal or retroperitoneal lymphadenopathy. No pelvic sidewall lymphadenopathy. Reproductive: Uterus surgically absent.  There is no adnexal mass. Other: Small volume intraperitoneal free fluid. Musculoskeletal: No worrisome lytic or sclerotic osseous abnormality. Diffuse body wall edema evident. IMPRESSION: 1. Marked edema and irregularity of the colonic wall, most prominently in the left colon. This is associated with substantial pericolonic edema/inflammation. Imaging features are compatible with the reported history of C diff colitis. 2. Small volume intraperitoneal free fluid with diffuse body wall edema. 3. Very large hiatal hernia. 4. 2-3 mm pulmonary nodule at the  right lung base. No follow-up needed if patient is low-risk. Non-contrast chest CT can be considered in 12 months if patient is high-risk. This recommendation follows the consensus statement: Guidelines for Management of Incidental Pulmonary Nodules Detected on CT Images: From the Fleischner Society 2017; Radiology 2017; 284:228-243. 5.  Aortic Atherosclerois (ICD10-170.0) Electronically Signed   By: Misty Stanley M.D.   On: 10/13/2018 18:11    EKG:   Orders placed or performed during the hospital encounter of 09/29/18  . ED EKG  . ED EKG  . EKG    ASSESSMENT AND PLAN:   #1. C. difficile colitis: Continue p.o. vancomycin, continue enteric precautions, p continue p.o. vancomycin, CT abdomen showed Marked edema and irregularity of colonic wall consistent with colitis.  Continue IV fluids, antibiotics.  Her abdominal pain, diarrhea less today and p.o. intake is better today.     2.  Chronic hypotension: Continue IV fluids, continue to hold Norvasc, atenolol, benazepril.  Give fluid bolus 500 cc NS because of hypotension now . #3 .proximal H fibrillation: Supratherapeutic INR up to 8.94 better now with 3.53 status post vitamin K 10 mg subcu yesterday #4 .recent GBS bacteremia, hospitalized from 9/14 , 9/18, patient received Rocephin for 2 weeks via port, repeat blood cultures from 101/1 at oncology office did not show any growth for 24 hours. 5.  Recurrent vulvar cancer, patient is undergoing chemotherapy, history of radical vulvectomy in 2007, follows up with Dr. Tasia Catchings as an outpatient, according to Dr. Collie Siad note patient is not a candidate for further gynecological surgery due to her poor performance status.  Dr. Tasia Catchings discussed with the event oncology Dr. Theora Gianotti.  Patient seen by palliative care team Josh as an outpatient.  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 Tasia Catchings yesterday.   #6. hyperlipidemia:  Continue statins. Spoke with patient daughter-in-law Sharyn Lull yesterday, patient's daughter is coming from out of town today, Consult palliative care again.  More than 50% time spent in counseling, coordination of care. All the records are reviewed and case discussed with Care Management/Social Workerr. Management plans discussed with the patient, family and they are in agreement.  CODE STATUS: DNR  TOTAL TIME TAKING CARE OF THIS PATIENT: 40 minutes.   POSSIBLE D/C IN 1-2 DAYS, DEPENDING ON CLINICAL CONDITION.   Epifanio Lesches M.D on 10/15/2018 at 1:15 PM  Between 7am to 6pm - Pager - [phone]  After 6pm go to [email or web address] - password [email or web address] [facility]  [facility]  Office  [phone]  CC: Primary care physician; Pleas Koch, NP   Note: This dictation was prepared with Dragon dictation along with smaller phrase technology. Any transcriptional errors that result from this process are unintentional.

## 2018-10-15 NOTE — Consult Note (Signed)
[place] for warfarin dosing Indication: atrial fibrillation  No Known Allergies  Labs: Recent Labs    10/13/18 0406 10/14/18 0435 10/15/18 0500  HGB 9.5* 9.5* 10.6*  HCT 31.1* 30.9* 35.0*  PLT 207 244 248  LABPROT 48.2* 72.5* 34.8*  INR 5.30* 8.94* 3.53    Estimated Creatinine Clearance: 46.7 mL/min (by C-G formula based on SCr of 0.82 mg/dL).   Assessment: 77 year-old female female on warfarin 3mg  daily PTA  On a recent admission here in September she exhibited the following response:  DATE   INR      Dose   9/14     1.84     3 mg  9/15     2.73     hold  9/16     2.36     2mg  9/17     2.67     2mg    10/12   5.97     Held 10/13   7.01     Held 10/14   5.3       Held 10/15   8.94     Held - provider ordered IV VitK 1mg  once. 10/16   3.53     Held  Goal of Therapy:  INR 2-3 Monitor platelets by anticoagulation protocol: Yes   Plan:  Her INR remains supratherapeutic today at 3.53. No warfarin today. Follow up daily INR and pharmacy will begin warfarin when her INR is closer to goal range. Will order CBC for AM to monitor Hgb.   Pharmacy will continue to follow.   Forrest Moron, PharmD 10/15/2018,7:12 AM

## 2018-10-15 NOTE — Care Management Important Message (Signed)
Important Message  Patient Details  Name: Virginia Murray MRN: [id number] Date of Birth: 10-26-1941   Medicare Important Message Given:  Yes    Shelbie Ammons, RN 10/15/2018, 1:09 PM

## 2018-10-16 DIAGNOSIS — I9589 Other hypotension: Secondary | ICD-10-CM

## 2018-10-16 DIAGNOSIS — E861 Hypovolemia: Secondary | ICD-10-CM

## 2018-10-16 LAB — BASIC METABOLIC PANEL
ANION GAP: 3 — AB (ref 5–15)
BUN: 32 mg/dL — ABNORMAL HIGH (ref 8–23)
CALCIUM: 7.4 mg/dL — AB (ref 8.9–10.3)
CO2: 18 mmol/L — AB (ref 22–32)
Chloride: 123 mmol/L — ABNORMAL HIGH (ref 98–111)
Creatinine, Ser: 1.15 mg/dL — ABNORMAL HIGH (ref 0.44–1.00)
GFR calc non Af Amer: 45 mL/min — ABNORMAL LOW (ref >60)
GFR, EST AFRICAN AMERICAN: 53 mL/min — AB (ref >60)
Glucose, Bld: 125 mg/dL — ABNORMAL HIGH (ref 70–99)
POTASSIUM: 4.2 mmol/L (ref 3.5–5.1)
Sodium: 144 mmol/L (ref 135–145)

## 2018-10-16 LAB — CBC
HCT: 32.4 % — ABNORMAL LOW (ref 36.0–46.0)
Hemoglobin: 9.7 g/dL — ABNORMAL LOW (ref 12.0–15.0)
MCH: 25.5 pg — ABNORMAL LOW (ref 26.0–34.0)
MCHC: 29.9 g/dL — ABNORMAL LOW (ref 30.0–36.0)
MCV: 85 fL (ref 80.0–100.0)
NRBC: 0 % (ref 0.0–0.2)
Platelets: 236 10*3/uL (ref 150–400)
RBC: 3.81 MIL/uL — AB (ref 3.87–5.11)
RDW: 17.3 % — ABNORMAL HIGH (ref 11.5–15.5)
WBC: 14.2 10*3/uL — ABNORMAL HIGH (ref 4.0–10.5)

## 2018-10-16 LAB — PROTIME-INR
INR: 3.42
PROTHROMBIN TIME: 34 s — AB (ref 11.4–15.2)

## 2018-10-16 MED ORDER — SODIUM CHLORIDE 0.9 % IV BOLUS
500.0000 mL | Freq: Once | INTRAVENOUS | Status: AC
  Administered 2018-10-16: 14:00:00 500 mL via INTRAVENOUS

## 2018-10-16 NOTE — Progress Notes (Signed)
Patient assisted to [facility]. Wound to perineum cleansed with sterile water and irrigation bottle. Butt cream applied to patients surrounding area. Patient placed on side for comfort. Patient c/o severe pain with diarrhea to perineal area. She is also very tearful and speaks of husbands passing and her thoughts of dying.  Patient also  Hypotensive. Fentanyl patch removed per order will need replacing. MD paged for bolus, palliative care consult, psych consult for depression and wound consult. Port accessed for fluid resuscitation as IV infiltrated. Patient side lying for symptom management.

## 2018-10-16 NOTE — Consult Note (Signed)
[place] Nurse wound consult note Patient re-evaluated in [facility] in the presence of the SWOT nurse.  No family present. Reason for Consult: Re-evaluation of perineal area that was "worsened" Wound type: The etiology of the perineal wound tissue abnormality is not completely clear to me.  It wasn't three days ago, it remain unclear.  She continues to have fecal and urinary incontinence.  There is a high degree of abnormal tissue appearance at the right labia and vaginal opening, as well as, the left perineal area and rectum.  The erythema I saw three days ago is improved.  Nursing has been applying Gerhardt's butt cream as ordered.  There is a smooth, rounded tissue protrusion on the left between the vagina and rectum.  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  To determine the origin, it would be necessary to obtain a biopsy.  However, I don't know if the patient or her oncologist would see benefit in this approach.  Plan of care:  Continue the Gerhardt's butt cream, seek palliative care for pain management, and discuss further options for definitive diagnosis with her medical/oncological teams. Monitor the wound area(s) for worsening of condition such as: Signs/symptoms of infection,  Increase in size,  Development of or worsening of odor, Development of pain, or increased pain at the affected locations.  Notify the medical team if any of these develop.  Thank you for the consult.  Discussed plan of care with the patient and bedside nurse.  [place] nurse will not follow at this time.  Please re-consult the [place] team if needed.  Val Riles, RN, MSN, CWOCN, CNS-BC, pager [phone]

## 2018-10-16 NOTE — Progress Notes (Signed)
PT Cancellation Note  Patient Details Name: Virginia Murray MRN: [id number] DOB: 1941-11-20   Cancelled Treatment:    Reason Eval/Treat Not Completed: Medical issues which prohibited therapy(Recent BP reading 75/39).  Will hold PT until pt more medically appropriate.    Collie Siad PT, DPT 10/16/2018, 3:23 PM

## 2018-10-16 NOTE — Progress Notes (Signed)
[place] [facility] [place] NAME: Virginia Murray    MR#:  [id number]  DATE OF BIRTH:  20-Aug-1941  Diarrhea, abdominal pain fluctuates.  Seen by palliative care from [facility] and oncology as an outpatient.  CHIEF COMPLAINT:   Chief Complaint  Patient presents with  . Diarrhea  . Vaginal Bleeding   Intake better.  Nurse thinks that her vulvar area rash is more and requesting another wound care eval.  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 REVIEW OF SYSTEMS:   Review of Systems  Gastrointestinal: Positive for abdominal pain and diarrhea.   CONSTITUTIONAL: No fever.  Has generalized weakness. EYES: No blurred or double vision.  EARS, NOSE, AND THROAT: No tinnitus or ear pain.  RESPIRATORY: No cough, shortness of breath, wheezing or hemoptysis.  CARDIOVASCULAR: No chest pain, orthopnea, edema.  GASTROINTESTINAL: Diarrhea, abdominal pain, poor p.o. Intake  GENITOURINARY: No dysuria, hematuria.  ENDOCRINE: No polyuria, nocturia,  HEMATOLOGY: No anemia, easy bruising or bleeding SKIN: No rash or lesion. MUSCULOSKELETAL: No joint pain or arthritis.   NEUROLOGIC: No tingling, numbness, weakness.  PSYCHIATRY: No anxiety or depression.   DRUG ALLERGIES:  No Known Allergies  VITALS:  Blood pressure (!) 75/39, pulse 65, temperature 97.7 F (36.5 C), temperature source Oral, resp. rate 15, SpO2 98 %.  PHYSICAL EXAMINATION:  GENERAL:  77 y.o.-year-old patient lying in the bed with no acute distress.  Appears cachectic. EYES: Pupils equal, round, reactive to light and accommodation. No scleral icterus. Extraocular muscles intact.  HEENT: Head atraumatic, normocephalic. Oropharynx and nasopharynx clear.  NECK:  Supple, no jugular venous distention. No thyroid enlargement, no tenderness.  LUNGS: Normal breath sounds bilaterally, no wheezing, rales,rhonchi or crepitation. No use of accessory  muscles of respiration.  CARDIOVASCULAR: S1, S2 normal. No murmurs, rubs, or gallops.  ABDOMEN:g abdomen soft, bowel sounds diminished, no abdominal tenderness today  eXTREMITIES: No pedal edema, cyanosis, or clubbing.  NEUROLOGIC: Cranial nerves II through XII are intact. Muscle strength 5/5 in all extremities. Sensation intact. Gait not checked.  PSYCHIATRIC: The patient is alert and oriented x 3.  SKIN: Noted to have excoriation in the vaginal area and also rectal area.   LABORATORY PANEL:   CBC Recent Labs  Lab 10/16/18 0332  WBC 14.2*  HGB 9.7*  HCT 32.4*  PLT 236   ------------------------------------------------------------------------------------------------------------------  Chemistries  Recent Labs  Lab 10/11/18 1413  10/16/18 0332  NA 140   < > 144  K 4.6   < > 4.2  CL 113*   < > 123*  CO2 22   < > 18*  GLUCOSE 97   < > 125*  BUN 30*   < > 32*  CREATININE 0.70   < > 1.15*  CALCIUM 8.1*   < > 7.4*  AST 16  --   --   ALT 11  --   --   ALKPHOS 54  --   --   BILITOT 0.5  --   --    < > = values in this interval not displayed.   ------------------------------------------------------------------------------------------------------------------  Cardiac Enzymes No results for input(s): TROPONINI in the last 168 hours. ------------------------------------------------------------------------------------------------------------------  RADIOLOGY:  No results found.  EKG:   Orders placed or performed during the hospital encounter of 09/29/18  . ED EKG  . ED EKG  . EKG    ASSESSMENT AND PLAN:   #1. C. difficile colitis: Continue p.o.  vancomycin, continue enteric precautions, p continue p.o. vancomycin, CT abdomen showed Marked edema and irregularity of colonic wall consistent with colitis.  C continue IV fluids, will use PRN boluses for hypotension, WBC also decreased to 14.2.  2.  Chronic hypotension: Continue IV fluids, continue to hold Norvasc, atenolol,  benazepril.   . #3 .  Paroxysmal atrial fibrillation:: Supratherapeutic INR up to 8.94 better now to 3.42.  #4 .recent GBS bacteremia, hospitalized from 9/14 , 9/18, patient received Rocephin for 2 weeks via port, repeat blood cultures from 101/1 at oncology office did not show any growth for 24 hours.  5.  Recurrent vulvar cancer, patient is undergoing chemotherapy, history of radical vulvectomy in 2007, follows up with Dr. Tasia Catchings as an outpatient, according to Dr. Collie Siad note patient is not a candidate for further gynecological surgery due to her poor performance status.  Dr. Tasia Catchings discussed with the event oncology Dr. Theora Gianotti.  Patient seen by palliative care team [place] as an outpatient.  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 Tasia Catchings .  Will request palliative care consult while inpatient, psychiatric consult after palliative care consult input.  Patient lost her husband recently and has been having some hallucinations, will obtain a psychiatric consult for evaluation of underlying depression. #6. hyperlipidemia: Continue statins. Spoke with patient daughter-in-law Sharyn Lull yesterday, patient's daughter is coming from out of town today, Consult palliative care again. More than 50% time spent in counseling, coordination of care. All the records are reviewed and case discussed with Care Management/Social Workerr. Management plans discussed with the patient, family and they are in agreement.  CODE STATUS: DNR  TOTAL TIME TAKING CARE OF THIS PATIENT: 40 minutes.   POSSIBLE D/C IN 1-2 DAYS, DEPENDING ON CLINICAL CONDITION.   Epifanio Lesches M.D on 10/16/2018 at 2:27 PM  Between 7am to 6pm - Pager - [phone]  After 6pm go to [email or web address] - password [email or web address] [facility]  [facility]  Office  [phone]  CC: Primary care physician; Pleas Koch, NP   Note: This dictation was prepared  with Dragon dictation along with smaller phrase technology. Any transcriptional errors that result from this process are unintentional.

## 2018-10-16 NOTE — Consult Note (Signed)
ANTICOAGULATION CONSULT NOTE  Pharmacy Consult for warfarin dosing Indication: atrial fibrillation  No Known Allergies  Labs: Recent Labs    10/14/18 0435 10/15/18 0500 10/16/18 0332  HGB 9.5* 10.6* 9.7*  HCT 30.9* 35.0* 32.4*  PLT 244 248 236  LABPROT 72.5* 34.8* 34.0*  INR 8.94* 3.53 3.42  CREATININE  --   --  1.15*    Estimated Creatinine Clearance: 33.3 mL/min (A) (by C-G formula based on SCr of 1.15 mg/dL (H)).   Assessment: 77 year-old female female on warfarin 3mg  daily PTA  On a recent admission here in September she exhibited the following response:  DATE   INR      Dose   9/14     1.84     3 mg  9/15     2.73     hold  9/16     2.36     2mg  9/17     2.67     2mg    10/12   5.97     Held 10/13   7.01     Held 10/14   5.3       Held 10/15   8.94     Held - provider ordered IV VitK 1mg  once. 10/16   3.53     Held 10/17   3.42     Held  Goal of Therapy:  INR 2-3 Monitor platelets by anticoagulation protocol: Yes   Plan:  Her INR remains supratherapeutic today at 3.42. No warfarin today. Follow up daily INR and pharmacy will begin warfarin when her INR is closer to goal range.   Pharmacy will continue to follow.   Forrest Moron, PharmD 10/16/2018,7:08 AM

## 2018-10-16 NOTE — Progress Notes (Signed)
Hematology/Oncology Progress Note [facility] Telephone:([phone] [phone]  Patient Care Team: Pleas Koch, NP as PCP - General (Internal Medicine) Clent Jacks, RN as Registered Nurse Earlie Server, MD as Medical Oncologist (Medical Oncology) Gillis Ends, MD as Consulting Physician (Gynecologic Oncology)   Name of the patient: Virginia Murray  [id number]  [birth year]  Date of visit: 10/16/18   INTERVAL HISTORY-  No acute overnight events. Lying in bed. Reports feeling better. Diarrhea has improved.  Blood pressure continues to be low, remains tachycardic. Abdominal pain is better  Review of systems- Review of Systems  Constitutional: Positive for malaise/fatigue. Negative for weight loss.  HENT: Negative for nosebleeds and sore throat.   Eyes: Negative for photophobia and redness.  Respiratory: Negative for cough, shortness of breath and wheezing.   Cardiovascular: Negative for chest pain, palpitations and orthopnea.  Gastrointestinal: Positive for diarrhea. Negative for abdominal pain, blood in stool, nausea and vomiting.  Genitourinary: Negative for dysuria.  Musculoskeletal: Negative for back pain, myalgias and neck pain.  Skin: Negative for itching and rash.  Neurological: Negative for dizziness, tingling and tremors.  Endo/Heme/Allergies: Negative for environmental allergies. Does not bruise/bleed easily.  Psychiatric/Behavioral: The patient is not nervous/anxious.     No Known Allergies  Patient Active Problem List   Diagnosis Date Noted  . Depression   . Elevated INR   . Hypomagnesemia 10/13/2018  . Generalized abdominal pain   . C. difficile colitis 10/11/2018  . Diarrhea 10/10/2018  . Nocturnal leg cramps 10/07/2018  . Atrial fibrillation ([place]) 09/26/2018  . Long term (current) use of anticoagulants 09/26/2018  . Hyperlipidemia 09/26/2018  . Constipation due to opioid therapy 09/26/2018  .  Generalized anxiety disorder   . Agitation   . Vulvar cancer ([place]) 09/03/2018  . Goals of care, counseling/discussion 09/03/2018     Past Medical History:  Diagnosis Date  . A-fib ([place])   . Cancer (H[facility])    vulva   . Depression   . Diverticulitis   . Hyperlipemia   . Hypertension   . Vulva cancer [facility])      Past Surgical History:  Procedure Laterality Date  . ABDOMINAL HYSTERECTOMY    . CATARACT EXTRACTION, BILATERAL    . KNEE SURGERY     right knee   . PORTA CATH INSERTION N/A 09/04/2018   Procedure: PORTA CATH INSERTION;  Surgeon: Algernon Huxley, MD;  Location: [place] [facility];  Service: Cardiovascular;  Laterality: N/A;  . TEE WITHOUT CARDIOVERSION N/A 09/16/2018   Procedure: TRANSESOPHAGEAL ECHOCARDIOGRAM (TEE);  Surgeon: Nelva Bush, MD;  Location: [facility] ORS;  Service: Cardiovascular;  Laterality: N/A;  . TONSILLECTOMY    . VULVA SURGERY      Social History   Socioeconomic History  . Marital status: Widowed    Spouse name: Not on file  . Number of children: 2  . Years of education: Not on file  . Highest education level: Not on file  Occupational History  . Not on file  Social Needs  . Financial resource strain: Not on file  . Food insecurity:    Worry: Not on file    Inability: Not on file  . Transportation needs:    Medical: Not on file    Non-medical: Not on file  Tobacco Use  . Smoking status: Former Smoker    Packs/day: 1.00    Years: 20.00    Pack years: 20.00    Types: Cigarettes    Last attempt  to quit: 08/29/1993    Years since quitting: 25.1  . Smokeless tobacco: Never Used  Substance and Sexual Activity  . Alcohol use: Not Currently  . Drug use: Never  . Sexual activity: Not Currently  Lifestyle  . Physical activity:    Days per week: Not on file    Minutes per session: Not on file  . Stress: Not on file  Relationships  . Social connections:    Talks on phone: Not on file    Gets together: Not on file    Attends religious  service: Not on file    Active member of club or organization: Not on file    Attends meetings of clubs or organizations: Not on file    Relationship status: Not on file  . Intimate partner violence:    Fear of current or ex partner: Not on file    Emotionally abused: Not on file    Physically abused: Not on file    Forced sexual activity: Not on file  Other Topics Concern  . Not on file  Social History Narrative  . Not on file     Family History  Problem Relation Age of Onset  . Alzheimer's disease Mother   . Heart disease Father      Current Facility-Administered Medications:  .  0.9 %  sodium chloride infusion, , Intravenous, Continuous, Earlie Server, MD, Last Rate: 100 mL/hr at 10/16/18 1958 .  acetaminophen (TYLENOL) tablet 650 mg, 650 mg, Oral, Q6H PRN **OR** acetaminophen (TYLENOL) suppository 650 mg, 650 mg, Rectal, Q6H PRN, Mayo, Pete Pelt, MD .  ALPRAZolam Duanne Moron) tablet 0.25 mg, 0.25 mg, Oral, BID PRN, Pyreddy, Pavan, MD .  feeding supplement (ENSURE ENLIVE) (ENSURE ENLIVE) liquid 237 mL, 237 mL, Oral, TID BM, Epifanio Lesches, MD, 237 mL at 10/13/18 1436 .  fentaNYL (DURAGESIC - dosed mcg/hr) patch 25 mcg, 25 mcg, Transdermal, Q72H, Mayo, Pete Pelt, MD, 25 mcg at 10/16/18 1548 .  gabapentin (NEURONTIN) capsule 300 mg, 300 mg, Oral, QHS, Mayo, Pete Pelt, MD, 300 mg at 10/16/18 2022 .  Gerhardt's butt cream, , Topical, TID, Epifanio Lesches, MD .  HYDROmorphone (DILAUDID) injection 0.5 mg, 0.5 mg, Intravenous, Q4H PRN, Mayo, Pete Pelt, MD, 0.5 mg at 10/15/18 0843 .  mirtazapine (REMERON) tablet 7.5 mg, 7.5 mg, Oral, QHS, Earlie Server, MD, 7.5 mg at 10/16/18 2022 .  multivitamin with minerals tablet 1 tablet, 1 tablet, Oral, Daily, Epifanio Lesches, MD, 1 tablet at 10/16/18 1544 .  ondansetron (ZOFRAN) tablet 4 mg, 4 mg, Oral, Q6H PRN **OR** ondansetron (ZOFRAN) injection 4 mg, 4 mg, Intravenous, Q6H PRN, Mayo, Pete Pelt, MD .  oxyCODONE (Oxy IR/ROXICODONE) immediate  release tablet 5 mg, 5 mg, Oral, Q4H PRN, Mayo, Pete Pelt, MD, 5 mg at 10/15/18 2207 .  pravastatin (PRAVACHOL) tablet 40 mg, 40 mg, Oral, Daily, Mayo, Pete Pelt, MD, 40 mg at 10/16/18 1544 .  prochlorperazine (COMPAZINE) tablet 10 mg, 10 mg, Oral, Q6H PRN, Mayo, Pete Pelt, MD .  vancomycin (VANCOCIN) 50 mg/mL oral solution 125 mg, 125 mg, Oral, QID, Mayo, Pete Pelt, MD, 125 mg at 10/16/18 1543 .  vitamin B-12 (CYANOCOBALAMIN) tablet 1,000 mcg, 1,000 mcg, Oral, Daily, Mayo, Pete Pelt, MD, 1,000 mcg at 10/16/18 1544   Physical exam:  Vitals:   10/16/18 1350 10/16/18 1405 10/16/18 1544 10/16/18 1941  BP: (!) 75/57 (!) 75/39 (!) 114/41 (!) 97/52  Pulse: (!) 190 65 (!) 114 (!) 115  Resp:    16  Temp: 97.7 F (36.5 C)   (!) 97.5 F (36.4 C)  TempSrc: Oral   Oral  SpO2: 90% 98%  99%    Constitutional: NAD HENT:  Moist oral mucosa, Pupils are equal, round, and reactive to light. EOM are normal.  Neck: Normal range of motion. Neck supple.  Cardiovascular: Normal rate and regular rhythm.  Pulmonary/Chest: No respiratory distress  Abdominal: Soft. Mild discomfort with palpation.   Neurological: She is alert.  Skin: Skin is warm and dry. No erythema.  Psychiatric: less anxious today.   Gyn : Left vulvar ulcerated mass extending to the Vagina   CMP Latest Ref Rng & Units 10/16/2018  Glucose 70 - 99 mg/dL 125(H)  BUN 8 - 23 mg/dL 32(H)  Creatinine 0.44 - 1.00 mg/dL 1.15(H)  Sodium 135 - 145 mmol/L 144  Potassium 3.5 - 5.1 mmol/L 4.2  Chloride 98 - 111 mmol/L 123(H)  CO2 22 - 32 mmol/L 18(L)  Calcium 8.9 - 10.3 mg/dL 7.4(L)  Total Protein 6.5 - 8.1 g/dL -  Total Bilirubin 0.3 - 1.2 mg/dL -  Alkaline Phos 38 - 126 U/L -  AST 15 - 41 U/L -  ALT 0 - 44 U/L -   CBC Latest Ref Rng & Units 10/16/2018  WBC 4.0 - 10.5 K/uL 14.2(H)  Hemoglobin 12.0 - 15.0 g/dL 9.7(L)  Hematocrit 36.0 - 46.0 % 32.4(L)  Platelets 150 - 400 K/uL 236   RADIOGRAPHIC STUDIES: I have personally reviewed the  radiological images as listed and agreed with the findings in the report.  Ct Head Wo Contrast  Result Date: 09/29/2018 CLINICAL DATA:  Syncopal episode, dizziness, weakness, fall and head trauma EXAM: CT HEAD WITHOUT CONTRAST TECHNIQUE: Contiguous axial images were obtained from the base of the skull through the vertex without intravenous contrast. COMPARISON:  09/15/2018 FINDINGS: Brain: Stable atrophy pattern and chronic white matter microvascular ischemic changes throughout both cerebral hemispheres. No acute intracranial hemorrhage, new infarction, mass lesion, midline shift, herniation, hydrocephalus, or extra-axial fluid collection. No focal mass effect or edema. Cisterns are patent. Cerebellar atrophy as well. Vascular: Intracranial atherosclerosis.  No hyperdense vessel. Skull: Normal. Negative for fracture or focal lesion. Sinuses/Orbits: No acute orbital finding. Minor right ethmoid mucosal thickening. Other sinuses visualized are clear. Mastoids are clear. Other: None. IMPRESSION: Stable atrophy pattern and chronic white matter microvascular ischemic changes. No acute intracranial abnormality by noncontrast CT. Electronically Signed   By: Jerilynn Mages.  Shick M.D.   On: 09/29/2018 21:08   Ct Abdomen Pelvis W Contrast  Result Date: 10/13/2018 CLINICAL DATA:  Locally advanced vulvar cancer. Now with diarrhea and diffuse abdominal discomfort. EXAM: CT ABDOMEN AND PELVIS WITH CONTRAST TECHNIQUE: Multidetector CT imaging of the abdomen and pelvis was performed using the standard protocol following bolus administration of intravenous contrast. CONTRAST:  47mL OMNIPAQUE IOHEXOL 300 MG/ML  SOLN COMPARISON:  None. FINDINGS: Lower chest: Large hiatal hernia. Small bilateral pleural effusions. 2-3 mm pulmonary nodule noted at the right base (1/4). Hepatobiliary: No focal abnormality within the liver parenchyma. There is no evidence for gallstones, gallbladder wall thickening, or pericholecystic fluid. No intrahepatic  or extrahepatic biliary dilation. Pancreas: No focal mass lesion. No dilatation of the main duct. No intraparenchymal cyst. No peripancreatic edema. Spleen: No splenomegaly. Tiny low-density subcapsular focus posterior spleen is likely a cyst or pseudocyst. Adrenals/Urinary Tract: No adrenal nodule or mass. Tiny hypoattenuating lesions in each kidney are too small to characterize. No evidence for hydroureter. Mild circumferential bladder wall thickening evident despite underdistention. Stomach/Bowel: Large  hiatal hernia with nearly the entire stomach contained in the chest. Duodenum is normally positioned as is the ligament of Treitz. No small bowel wall thickening. No small bowel dilatation. Terminal ileum unremarkable. The appendix is not visualized, but there is no edema or inflammation in the region of the cecum. Right colon is stool-filled. Left colon shows irregular circumferential wall thickening, advanced in the sigmoid colon. There is associated pericolonic edema/inflammation. Prominent stool volume noted in the rectum. Vascular/Lymphatic: There is abdominal aortic atherosclerosis without aneurysm. There is no gastrohepatic or hepatoduodenal ligament lymphadenopathy. No intraperitoneal or retroperitoneal lymphadenopathy. No pelvic sidewall lymphadenopathy. Reproductive: Uterus surgically absent.  There is no adnexal mass. Other: Small volume intraperitoneal free fluid. Musculoskeletal: No worrisome lytic or sclerotic osseous abnormality. Diffuse body wall edema evident. IMPRESSION: 1. Marked edema and irregularity of the colonic wall, most prominently in the left colon. This is associated with substantial pericolonic edema/inflammation. Imaging features are compatible with the reported history of C diff colitis. 2. Small volume intraperitoneal free fluid with diffuse body wall edema. 3. Very large hiatal hernia. 4. 2-3 mm pulmonary nodule at the right lung base. No follow-up needed if patient is low-risk.  Non-contrast chest CT can be considered in 12 months if patient is high-risk. This recommendation follows the consensus statement: Guidelines for Management of Incidental Pulmonary Nodules Detected on CT Images: From the Fleischner Society 2017; Radiology 2017; 284:228-243. 5.  Aortic Atherosclerois (ICD10-170.0) Electronically Signed   By: Misty Stanley M.D.   On: 10/13/2018 18:11    Assessment and plan-  Patient is a 77 y.o. female with history of recurrent locally advanced vulvar cancer, currently on chemotherapy, A. fib on Coumadin, hypertension, anxiety/depression presents for evaluation of abdominal discomfort and C. difficile colitis.  #C. difficile colitis/diarrhea, clinically improving.  Continue with oral vancomycin daily.   #Hypotension, persistent, has been on continuous IV fluid for hydration. Hypotension likely due to hypovolemia. BP medication on hold.   #Recurrent vulvar cancer, outpatient follow-up at the cancer center for evaluation of next cycle of chemotherapy.  She is not due for next round of chemotherapy until 10/24/2018. Prognosis is very poor. Previously discussed about goal of care and patient desires treatment.  Would care evaluation.  PT evaluation was held due to hypotension.  Case manger on board.  Agree with consulting palliative care. She has previously established care with [facility]. Discussed with Merrily Pew and he will see her tmr.   # neoplasm related pain: Continue fentanyl patch and oxycodone  # Recent bacteremia,s/p IV antibiotics course.  recent peripheral blood culture negative.  # Anxiety and depression.  Continue Remeron QHS # Supratherapeutic INR 3.42. Hold coumadin.   Thank you for allowing me to participate in the care of this patient.  We will continue to follow her inpatient course. Total face to face encounter time for this patient visit was 25 min. >50% of the time was  spent in counseling and coordination of care.  Earlie Server, MD,  PhD Hematology Oncology [facility] at [facility] Pager- [phone] 10/16/2018

## 2018-10-16 NOTE — Progress Notes (Signed)
PT Cancellation Note  Patient Details Name: Virginia Murray MRN: [id number] DOB: 04-01-1941   Cancelled Treatment:    Reason Eval/Treat Not Completed: Patient declined, no reason specified.  Upon arrival to room this PT introduced herself several times but the pt keeps her eyes closed.  Provided light tactile cue with pt barely opening eyes and refusing to work with PT.  When asked when a good time to return the pt responds, "there is no good time".  Will attempt to see pt again later today, schedule permitting.    Collie Siad PT, DPT 10/16/2018, 9:37 AM

## 2018-10-17 ENCOUNTER — Inpatient Hospital Stay: Payer: Medicare Other | Admitting: Hospice and Palliative Medicine

## 2018-10-17 ENCOUNTER — Inpatient Hospital Stay: Payer: Medicare Other | Admitting: Oncology

## 2018-10-17 ENCOUNTER — Inpatient Hospital Stay: Payer: Medicare Other

## 2018-10-17 DIAGNOSIS — F4321 Adjustment disorder with depressed mood: Secondary | ICD-10-CM

## 2018-10-17 DIAGNOSIS — Z515 Encounter for palliative care: Secondary | ICD-10-CM

## 2018-10-17 LAB — PROTIME-INR
INR: 3.63
Prothrombin Time: 35.6 seconds — ABNORMAL HIGH (ref 11.4–15.2)

## 2018-10-17 MED ORDER — SODIUM CHLORIDE 0.9 % IV BOLUS
1000.0000 mL | Freq: Once | INTRAVENOUS | Status: AC
  Administered 2018-10-17: 1000 mL via INTRAVENOUS

## 2018-10-17 NOTE — Consult Note (Signed)
ANTICOAGULATION CONSULT NOTE  Pharmacy Consult for warfarin dosing Indication: atrial fibrillation  No Known Allergies  Labs: Recent Labs    10/15/18 0500 10/16/18 0332 10/17/18 0432  HGB 10.6* 9.7*  --   HCT 35.0* 32.4*  --   PLT 248 236  --   LABPROT 34.8* 34.0* 35.6*  INR 3.53 3.42 3.63  CREATININE  --  1.15*  --     Estimated Creatinine Clearance: 33.3 mL/min (A) (by C-G formula based on SCr of 1.15 mg/dL (H)).   Assessment: 77 year-old female female on warfarin 3mg  daily PTA  On a recent admission here in September she exhibited the following response:  DATE   INR      Dose   9/14     1.84     3 mg  9/15     2.73     hold  9/16     2.36     2mg  9/17     2.67     2mg    10/12   5.97     Held 10/13   7.01     Held 10/14   5.3       Held 10/15   8.94     Held - provider ordered IV VitK 1mg  once. 10/16   3.53     Held 10/17   3.42     Held 10/18   3.63     Held  Goal of Therapy:  INR 2-3 Monitor platelets by anticoagulation protocol: Yes   Plan:  Her INR remains supratherapeutic today at 3.63. No warfarin today. Follow up daily INR and pharmacy will begin warfarin when her INR is closer to goal range.   Pharmacy will continue to follow.   Forrest Moron, PharmD 10/17/2018,8:23 AM

## 2018-10-17 NOTE — Progress Notes (Signed)
[place]  [phone] [phone]   Name: Virginia Murray Date: 10/17/2018 MRN: [id number]  DOB: 09-14-1941  Patient Care Team: Pleas Koch, NP as PCP - General (Internal Medicine) Clent Jacks, RN as Registered Nurse Earlie Server, MD as Medical Oncologist (Medical Oncology) Gillis Ends, MD as Consulting Physician (Gynecologic Oncology)    REASON FOR CONSULTATION: Palliative Care consult requested for this 77 y.o. 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  Patient is being treated on carboplatin with Taxol.  PMH also notable for paroxysmal A. fib anticoagulated on warfarin, hypertension, hyperlipidemia, and depression.  She was hospitalized 09/13/2018 to 09/17/2018 with sepsis found to have blood cultures positive for group G Streptococcus.    Patient was again admitted on 10/11/2018 with diarrhea from Clostridium difficile colitis.  She was also found to be hypotensive.  Palliative care was asked to help address goals of care.  SOCIAL HISTORY:    Patient is widowed, having lost her husband May 2019.  She moved here from [place] to live with her son. She also has a daughter who lives in [place].  ADVANCE DIRECTIVES:  None  CODE STATUS: DNR  PAST MEDICAL HISTORY: Past Medical History:  Diagnosis Date  . A-fib ([place])   . Cancer ([facility])    vulva   . Depression   . Diverticulitis   . Hyperlipemia   . Hypertension   . Vulva cancer ([place])     PAST SURGICAL HISTORY:  Past Surgical History:  Procedure Laterality Date  . ABDOMINAL HYSTERECTOMY    . CATARACT EXTRACTION, BILATERAL    . KNEE SURGERY     right knee   . PORTA CATH INSERTION N/A 09/04/2018   Procedure: PORTA CATH INSERTION;  Surgeon: Algernon Huxley, MD;  Location: [facility];  Service:  Cardiovascular;  Laterality: N/A;  . TEE WITHOUT CARDIOVERSION N/A 09/16/2018   Procedure: TRANSESOPHAGEAL ECHOCARDIOGRAM (TEE);  Surgeon: Nelva Bush, MD;  Location: [facility];  Service: Cardiovascular;  Laterality: N/A;  . TONSILLECTOMY    . VULVA SURGERY      HEMATOLOGY/ONCOLOGY HISTORY:    Vulvar cancer ([place])   09/03/2018 Initial Diagnosis    Vulvar cancer ([place])    09/03/2018 -  Chemotherapy    The patient had palonosetron (ALOXI) injection 0.25 mg, 0.25 mg, Intravenous,  Once, 1 of 5 cycles Administration: 0.25 mg (10/03/2018) CARBOplatin (PARAPLATIN) 250 mg in sodium chloride 0.9 % 100 mL chemo infusion, 250 mg (100 % of original dose 254 mg), Intravenous,  Once, 1 of 5 cycles Dose modification:   (original dose 254 mg, Cycle 1, Reason: Provider Judgment),   (original dose 254 mg, Cycle 1) Administration: 250 mg (10/03/2018) PACLitaxel (TAXOL) 198 mg in sodium chloride 0.9 % 250 mL chemo infusion (> 80mg /m2), 135 mg/m2 = 198 mg (100 % of original dose 135 mg/m2), Intravenous,  Once, 1 of 5 cycles Dose modification: 135 mg/m2 (original dose 135 mg/m2, Cycle 1, Reason: Other (see comments), Comment: performance status) Administration: 198 mg (10/03/2018)  for chemotherapy treatment.      ALLERGIES:  has No Known Allergies.  MEDICATIONS:  Current Facility-Administered Medications  Medication Dose Route Frequency Provider Last Rate Last Dose  . 0.9 %  sodium chloride infusion   Intravenous Continuous Earlie Server, MD 100 mL/hr at 10/17/18 [phone]    . acetaminophen (TYLENOL)  tablet 650 mg  650 mg Oral Q6H PRN Mayo, Pete Pelt, MD       Or  . acetaminophen (TYLENOL) suppository 650 mg  650 mg Rectal Q6H PRN Mayo, Pete Pelt, MD      . ALPRAZolam Duanne Moron) tablet 0.25 mg  0.25 mg Oral BID PRN Pyreddy, Reatha Harps, MD      . feeding supplement (ENSURE ENLIVE) (ENSURE ENLIVE) liquid 237 mL  237 mL Oral TID BM Epifanio Lesches, MD   237 mL at 10/13/18 1436  . fentaNYL (DURAGESIC - dosed mcg/hr)  patch 25 mcg  25 mcg Transdermal Q72H Sela Hua, MD   25 mcg at 10/16/18 1548  . gabapentin (NEURONTIN) capsule 300 mg  300 mg Oral QHS Mayo, Pete Pelt, MD   300 mg at 10/16/18 2022  . Gerhardt's butt cream   Topical TID Epifanio Lesches, MD      . HYDROmorphone (DILAUDID) injection 0.5 mg  0.5 mg Intravenous Q4H PRN Mayo, Pete Pelt, MD   0.5 mg at 10/15/18 0843  . mirtazapine (REMERON) tablet 7.5 mg  7.5 mg Oral QHS Earlie Server, MD   7.5 mg at 10/16/18 2022  . multivitamin with minerals tablet 1 tablet  1 tablet Oral Daily Epifanio Lesches, MD   1 tablet at 10/17/18 0747  . ondansetron (ZOFRAN) tablet 4 mg  4 mg Oral Q6H PRN Mayo, Pete Pelt, MD       Or  . ondansetron [facility]) injection 4 mg  4 mg Intravenous Q6H PRN Mayo, Pete Pelt, MD      . oxyCODONE (Oxy IR/ROXICODONE) immediate release tablet 5 mg  5 mg Oral Q4H PRN Mayo, Pete Pelt, MD   5 mg at 10/16/18 2358  . pravastatin (PRAVACHOL) tablet 40 mg  40 mg Oral Daily Mayo, Pete Pelt, MD   40 mg at 10/17/18 0747  . prochlorperazine (COMPAZINE) tablet 10 mg  10 mg Oral Q6H PRN Mayo, Pete Pelt, MD      . vancomycin (VANCOCIN) 50 mg/mL oral solution 125 mg  125 mg Oral QID Sela Hua, MD   125 mg at 10/17/18 1422  . vitamin B-12 (CYANOCOBALAMIN) tablet 1,000 mcg  1,000 mcg Oral Daily Mayo, Pete Pelt, MD   1,000 mcg at 10/17/18 0747    VITAL SIGNS: BP 110/61 (BP Location: Left Arm)   Pulse 82   Temp 98.2 F (36.8 C) (Oral)   Resp 18   SpO2 98%  There were no vitals filed for this visit.  Estimated body mass index is 19.49 kg/m as calculated from the following:   Height as of 09/29/18: 5\' 3"  (1.6 m).   Weight as of 10/10/18: 110 lb (49.9 kg).  LABS: CBC:    Component Value Date/Time   WBC 14.2 (H) 10/16/2018 0332   HGB 9.7 (L) 10/16/2018 0332   HCT 32.4 (L) 10/16/2018 0332   PLT 236 10/16/2018 0332   MCV 85.0 10/16/2018 0332   NEUTROABS 7.0 10/10/2018 1217   LYMPHSABS 0.5 (L) 10/10/2018 1217   MONOABS 0.5  10/10/2018 1217   EOSABS 0.0 10/10/2018 1217   BASOSABS 0.0 10/10/2018 1217   Comprehensive Metabolic Panel:    Component Value Date/Time   NA 144 10/16/2018 0332   K 4.2 10/16/2018 0332   CL 123 (H) 10/16/2018 0332   CO2 18 (L) 10/16/2018 0332   BUN 32 (H) 10/16/2018 0332   CREATININE 1.15 (H) 10/16/2018 0332   GLUCOSE 125 (H) 10/16/2018 0332   CALCIUM 7.4 (L) 10/16/2018  0332   AST 16 10/11/2018 1413   ALT 11 10/11/2018 1413   ALKPHOS 54 10/11/2018 1413   BILITOT 0.5 10/11/2018 1413   PROT 5.1 (L) 10/11/2018 1413   ALBUMIN 2.2 (L) 10/11/2018 1413    RADIOGRAPHIC STUDIES: Ct Head Wo Contrast  Result Date: 09/29/2018 CLINICAL DATA:  Syncopal episode, dizziness, weakness, fall and head trauma EXAM: CT HEAD WITHOUT CONTRAST TECHNIQUE: Contiguous axial images were obtained from the base of the skull through the vertex without intravenous contrast. COMPARISON:  09/15/2018 FINDINGS: Brain: Stable atrophy pattern and chronic white matter microvascular ischemic changes throughout both cerebral hemispheres. No acute intracranial hemorrhage, new infarction, mass lesion, midline shift, herniation, hydrocephalus, or extra-axial fluid collection. No focal mass effect or edema. Cisterns are patent. Cerebellar atrophy as well. Vascular: Intracranial atherosclerosis.  No hyperdense vessel. Skull: Normal. Negative for fracture or focal lesion. Sinuses/Orbits: No acute orbital finding. Minor right ethmoid mucosal thickening. Other sinuses visualized are clear. Mastoids are clear. Other: None. IMPRESSION: Stable atrophy pattern and chronic white matter microvascular ischemic changes. No acute intracranial abnormality by noncontrast CT. Electronically Signed   By: Jerilynn Mages.  Shick M.D.   On: 09/29/2018 21:08   Ct Abdomen Pelvis W Contrast  Result Date: 10/13/2018 CLINICAL DATA:  Locally advanced vulvar cancer. Now with diarrhea and diffuse abdominal discomfort. EXAM: CT ABDOMEN AND PELVIS WITH CONTRAST  TECHNIQUE: Multidetector CT imaging of the abdomen and pelvis was performed using the standard protocol following bolus administration of intravenous contrast. CONTRAST:  45mL OMNIPAQUE IOHEXOL 300 MG/ML  SOLN COMPARISON:  None. FINDINGS: Lower chest: Large hiatal hernia. Small bilateral pleural effusions. 2-3 mm pulmonary nodule noted at the right base (1/4). Hepatobiliary: No focal abnormality within the liver parenchyma. There is no evidence for gallstones, gallbladder wall thickening, or pericholecystic fluid. No intrahepatic or extrahepatic biliary dilation. Pancreas: No focal mass lesion. No dilatation of the main duct. No intraparenchymal cyst. No peripancreatic edema. Spleen: No splenomegaly. Tiny low-density subcapsular focus posterior spleen is likely a cyst or pseudocyst. Adrenals/Urinary Tract: No adrenal nodule or mass. Tiny hypoattenuating lesions in each kidney are too small to characterize. No evidence for hydroureter. Mild circumferential bladder wall thickening evident despite underdistention. Stomach/Bowel: Large hiatal hernia with nearly the entire stomach contained in the chest. Duodenum is normally positioned as is the ligament of Treitz. No small bowel wall thickening. No small bowel dilatation. Terminal ileum unremarkable. The appendix is not visualized, but there is no edema or inflammation in the region of the cecum. Right colon is stool-filled. Left colon shows irregular circumferential wall thickening, advanced in the sigmoid colon. There is associated pericolonic edema/inflammation. Prominent stool volume noted in the rectum. Vascular/Lymphatic: There is abdominal aortic atherosclerosis without aneurysm. There is no gastrohepatic or hepatoduodenal ligament lymphadenopathy. No intraperitoneal or retroperitoneal lymphadenopathy. No pelvic sidewall lymphadenopathy. Reproductive: Uterus surgically absent.  There is no adnexal mass. Other: Small volume intraperitoneal free fluid.  Musculoskeletal: No worrisome lytic or sclerotic osseous abnormality. Diffuse body wall edema evident. IMPRESSION: 1. Marked edema and irregularity of the colonic wall, most prominently in the left colon. This is associated with substantial pericolonic edema/inflammation. Imaging features are compatible with the reported history of C diff colitis. 2. Small volume intraperitoneal free fluid with diffuse body wall edema. 3. Very large hiatal hernia. 4. 2-3 mm pulmonary nodule at the right lung base. No follow-up needed if patient is low-risk. Non-contrast chest CT can be considered in 12 months if patient is high-risk. This recommendation follows the consensus statement: Guidelines for  Management of Incidental Pulmonary Nodules Detected on CT Images: From the Fleischner Society 2017; Radiology 2017; 284:228-243. 5.  Aortic Atherosclerois (ICD10-170.0) Electronically Signed   By: Misty Stanley M.D.   On: 10/13/2018 18:11    PERFORMANCE STATUS (ECOG) : 2 - Symptomatic, <50% confined to bed  Review of Systems As noted above. Otherwise, a complete review of systems is negative.  Physical Exam General: NAD, frail appearing, thin Cardiovascular: regular rate and rhythm Pulmonary: clear ant fields, unlabored Abdomen: soft, nontender, + bowel sounds GU: no suprapubic tenderness Extremities: no edema, no joint deformities Skin: no rashes Neurological: Weakness but otherwise nonfocal  IMPRESSION: Patient known to me from the outpatient clinic.  Case discussed with Dr. Tasia Catchings.  Patient is on attenuated treatment with overall poor prognosis.  However, patient has continued to desire ongoing treatment.  Today, patient says she generally feels lousy but does not elucidate on specifics.  She does volunteer that she has not been sleeping well.  I note that mirtazapine has been started.  I also note psych consult.  Agree with choice of mirtazapine, which might have beneficial effect of improving appetite, insomnia,  and moods.  Consideration could be given to increasing the dose of mirtazapine to 15 mg nightly.  I discussed with patient her goals of care.  However, patient is not readily forthcoming with information and generally did not seem inclined to talk at length about her wishes or goals.  She says that her primary goal at this point is to feel better and to return home at time of discharge to live with her son.  She does verbalize a desire for continued oncologic treatment with chemotherapy.    PLAN: Continue supportive care Consider increasing mirtazapine to 15 mg nightly Will continue to follow both inpatient and an outpatient clinic upon discharge.  Time Total: 30 minutes  Visit consisted of counseling and education dealing with the complex and emotionally intense issues of symptom management and palliative care in the setting of serious and potentially life-threatening illness.Greater than 50%  of this time was spent counseling and coordinating care related to the above assessment and plan.  Signed by: Altha Harm, [place], NP-C, [place] (Work Cell)

## 2018-10-17 NOTE — Progress Notes (Addendum)
[place] [facility] [place] NAME: Virginia Murray    MR#:  [id number]  DATE OF BIRTH:  1941-11-18  Overall in a good more than yesterday, and says still abdominal pain and diarrhea on and off.  Not eating much.  And refusing physical therapy.  CHIEF COMPLAINT:   Chief Complaint  Patient presents with  . Diarrhea  . Vaginal Bleeding   I REVIEW OF SYSTEMS:   Review of Systems  Constitutional: Negative for chills and fever.  HENT: Negative for hearing loss.   Eyes: Negative for blurred vision, double vision and photophobia.  Respiratory: Negative for cough, hemoptysis and shortness of breath.   Cardiovascular: Negative for palpitations, orthopnea and leg swelling.  Gastrointestinal: Negative for abdominal pain, diarrhea and vomiting.  Genitourinary: Negative for dysuria and urgency.  Musculoskeletal: Negative for myalgias and neck pain.  Skin: Negative for rash.  Neurological: Negative for dizziness, focal weakness, seizures, weakness and headaches.  Psychiatric/Behavioral: Positive for memory loss. The patient does not have insomnia.      DRUG ALLERGIES:  No Known Allergies  VITALS:  Blood pressure (!) 94/55, pulse (!) 101, temperature 98.2 F (36.8 C), temperature source Oral, resp. rate 16, SpO2 97 %.  PHYSICAL EXAMINATION:  GENERAL:  77 y.o.-year-old patient lying in the bed with no acute distress.  Appears cachectic.  Appears pale as well. EYES: Pupils equal, round, reactive to light . No scleral icterus. Extraocular muscles intact.  HEENT: Head atraumatic, normocephalic. Oropharynx and nasopharynx clear.  NECK:  Supple, no jugular venous distention. No thyroid enlargement, no tenderness.  LUNGS: Normal breath sounds bilaterally, no wheezing, rales,rhonchi or crepitation. No use of accessory muscles of respiration.  CARDIOVASCULAR: S1, S2 normal. No murmurs, rubs, or gallops.  ABDOMEN abdomen is soft, nontender,  nondistended, bowel sounds present. eXTREMITIES: No pedal edema, cyanosis, or clubbing.  NEUROLOGIC: Cranial nerves II through XII are intact. Muscle strength 5/5 in all extremities. Sensation intact. Gait not checked.  PSYCHIATRIC: The patient is alert and oriented x 3.  SKIN: Noted to have excoriation in the vaginal area and also rectal area.   LABORATORY PANEL:   CBC Recent Labs  Lab 10/16/18 0332  WBC 14.2*  HGB 9.7*  HCT 32.4*  PLT 236   ------------------------------------------------------------------------------------------------------------------  Chemistries  Recent Labs  Lab 10/11/18 1413  10/16/18 0332  NA 140   < > 144  K 4.6   < > 4.2  CL 113*   < > 123*  CO2 22   < > 18*  GLUCOSE 97   < > 125*  BUN 30*   < > 32*  CREATININE 0.70   < > 1.15*  CALCIUM 8.1*   < > 7.4*  AST 16  --   --   ALT 11  --   --   ALKPHOS 54  --   --   BILITOT 0.5  --   --    < > = values in this interval not displayed.   ------------------------------------------------------------------------------------------------------------------  Cardiac Enzymes No results for input(s): TROPONINI in the last 168 hours. ------------------------------------------------------------------------------------------------------------------  RADIOLOGY:  No results found.  EKG:   Orders placed or performed during the hospital encounter of 09/29/18  . ED EKG  . ED EKG  . EKG    ASSESSMENT AND PLAN:   #1. C. difficile colitis: Continue p.o. vancomycin, continue enteric precautions, p continue p.o. vancomycin, for 14 days total.  CT abdomen showed Marked edema and irregularity of colonic  wall consistent with colitis.  C continue IV fluids, will use PRN boluses for hypotension, WBC also decreased to 14.2.  From peak of 22.1 check CBC again tomorrow.  Today is day 7 of antibiotic 2.  Chronic hypotension: Continue IV fluids, continue to hold Norvasc, atenolol, benazepril.   . #3 .  Paroxysmal  atrial fibrillation:: Supratherapeutic INR up to 8.94 , status post vitamin K, high INR is still more than 3.  You to hold Coumadin, no evidence of bleeding at this time.  #4 .recent GBS bacteremia, hospitalized from 9/14 , 9/18, patient received Rocephin for 2 weeks via port, repeat blood cultures from 101/1 at oncology office did not show any growth for 24 hours.  5.  Recurrent vulvar cancer, patient is undergoing chemotherapy, history of radical vulvectomy in 2007, follows up with Dr. Tasia Catchings as an outpatient, according to Dr. Collie Siad note patient is not a candidate for further gynecological surgery due to her poor performance status.  Dr. Tasia Catchings discussed with the event oncology Dr. Theora Gianotti.  Patient seen by palliative care team Josh as an outpatient.  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 Tasia Catchings .  Will request palliative care consult while inpatient, psychiatric consult after palliative care consult input.  Patient lost her husband recently and has been having some hallucinations, psychiatric consult requested.  #6. hyperlipidemia: Continue statins. Spoke with patient daughter-in-law Sharyn Lull. patient's daughter is coming from out of town today,   #7 ,poor motivation, refusing physical therapy, wondering if she can be on  comfort care appreciate palliative care input.  More than 50% time spent in counseling, coordination of care. All the records are reviewed and case discussed with Care Management/Social Workerr. Management plans discussed with the patient, family and they are in agreement.  CODE STATUS: DNR  TOTAL TIME TAKING CARE OF THIS PATIENT: 40 minutes.   POSSIBLE D/C IN 1-2 DAYS, DEPENDING ON CLINICAL CONDITION.   Epifanio Lesches M.D on 10/17/2018 at 11:53 AM  Between 7am to 6pm - Pager - [phone]  After 6pm go to [email or web address] - password [email or web address] [facility]  [facility] [place] [facility]   Office  [phone]  CC: Primary care physician; Pleas Koch, NP   Note: This dictation was prepared with Dragon dictation along with smaller phrase technology. Any transcriptional errors that result from this process are unintentional.

## 2018-10-17 NOTE — Progress Notes (Signed)
Hematology/Oncology Progress Note [facility] [phone] [phone]  Patient Care Team: Pleas Koch, NP as PCP - General (Internal Medicine) Clent Jacks, RN as Registered Nurse Earlie Server, MD as Medical Oncologist (Medical Oncology) Gillis Ends, MD as Consulting Physician (Gynecologic Oncology)   Name of the patient: Virginia Murray  [id number]  Dec 22, 1941  Date of visit: 10/17/18   INTERVAL HISTORY-  No acute overnight events.  Lying in bed.  Reports feeling okay.  Diarrhea episode has decreased frequency.  Since morning she has not had any diarrhea episodes. Blood pressure also slightly improved as well.  Remains mildly tachycardic.  Abdominal pain is better. Appetite is poor.  Patient reports hospital food tastes badly  Review of systems- Review of Systems  Constitutional: Positive for malaise/fatigue. Negative for chills, fever and weight loss.  HENT: Negative for nosebleeds and sore throat.   Eyes: Negative for double vision, photophobia and redness.  Respiratory: Negative for cough, shortness of breath and wheezing.   Cardiovascular: Negative for chest pain, palpitations and orthopnea.  Gastrointestinal: Positive for diarrhea. Negative for abdominal pain, blood in stool, nausea and vomiting.  Genitourinary: Negative for dysuria.  Musculoskeletal: Negative for back pain, myalgias and neck pain.  Skin: Negative for itching and rash.  Neurological: Negative for dizziness, tingling and tremors.  Endo/Heme/Allergies: Negative for environmental allergies. Does not bruise/bleed easily.  Psychiatric/Behavioral: Negative for depression. The patient is not nervous/anxious.     No Known Allergies  Patient Active Problem List   Diagnosis Date Noted  . Hypotension due to hypovolemia   . Depression   . Elevated INR   . Hypomagnesemia 10/13/2018  . Generalized abdominal pain   . C. difficile colitis 10/11/2018    . Diarrhea 10/10/2018  . Nocturnal leg cramps 10/07/2018  . Atrial fibrillation ([place]) 09/26/2018  . Long term (current) use of anticoagulants 09/26/2018  . Hyperlipidemia 09/26/2018  . Constipation due to opioid therapy 09/26/2018  . Generalized anxiety disorder   . Agitation   . Vulvar cancer ([place]) 09/03/2018  . Goals of care, counseling/discussion 09/03/2018     Past Medical History:  Diagnosis Date  . A-fib ([place])   . Cancer ([facility])    vulva   . Depression   . Diverticulitis   . Hyperlipemia   . Hypertension   . Vulva cancer [facility])      Past Surgical History:  Procedure Laterality Date  . ABDOMINAL HYSTERECTOMY    . CATARACT EXTRACTION, BILATERAL    . KNEE SURGERY     right knee   . PORTA CATH INSERTION N/A 09/04/2018   Procedure: PORTA CATH INSERTION;  Surgeon: Algernon Huxley, MD;  Location: [place] [facility];  Service: Cardiovascular;  Laterality: N/A;  . TEE WITHOUT CARDIOVERSION N/A 09/16/2018   Procedure: TRANSESOPHAGEAL ECHOCARDIOGRAM (TEE);  Surgeon: Nelva Bush, MD;  Location: [facility] ORS;  Service: Cardiovascular;  Laterality: N/A;  . TONSILLECTOMY    . VULVA SURGERY      Social History   Socioeconomic History  . Marital status: Widowed    Spouse name: Not on file  . Number of children: 2  . Years of education: Not on file  . Highest education level: Not on file  Occupational History  . Not on file  Social Needs  . Financial resource strain: Not on file  . Food insecurity:    Worry: Not on file    Inability: Not on file  . Transportation needs:    Medical: Not  on file    Non-medical: Not on file  Tobacco Use  . Smoking status: Former Smoker    Packs/day: 1.00    Years: 20.00    Pack years: 20.00    Types: Cigarettes    Last attempt to quit: 08/29/1993    Years since quitting: 25.1  . Smokeless tobacco: Never Used  Substance and Sexual Activity  . Alcohol use: Not Currently  . Drug use: Never  . Sexual activity: Not Currently  Lifestyle   . Physical activity:    Days per week: Not on file    Minutes per session: Not on file  . Stress: Not on file  Relationships  . Social connections:    Talks on phone: Not on file    Gets together: Not on file    Attends religious service: Not on file    Active member of club or organization: Not on file    Attends meetings of clubs or organizations: Not on file    Relationship status: Not on file  . Intimate partner violence:    Fear of current or ex partner: Not on file    Emotionally abused: Not on file    Physically abused: Not on file    Forced sexual activity: Not on file  Other Topics Concern  . Not on file  Social History Narrative  . Not on file     Family History  Problem Relation Age of Onset  . Alzheimer's disease Mother   . Heart disease Father      Current Facility-Administered Medications:  .  0.9 %  sodium chloride infusion, , Intravenous, Continuous, Earlie Server, MD, Last Rate: 100 mL/hr at 10/17/18 [id number] .  acetaminophen (TYLENOL) tablet 650 mg, 650 mg, Oral, Q6H PRN **OR** acetaminophen (TYLENOL) suppository 650 mg, 650 mg, Rectal, Q6H PRN, Mayo, Pete Pelt, MD .  ALPRAZolam Duanne Moron) tablet 0.25 mg, 0.25 mg, Oral, BID PRN, Pyreddy, Pavan, MD .  feeding supplement (ENSURE ENLIVE) (ENSURE ENLIVE) liquid 237 mL, 237 mL, Oral, TID BM, Epifanio Lesches, MD, 237 mL at 10/13/18 1436 .  fentaNYL (DURAGESIC - dosed mcg/hr) patch 25 mcg, 25 mcg, Transdermal, Q72H, Mayo, Pete Pelt, MD, 25 mcg at 10/16/18 1548 .  gabapentin (NEURONTIN) capsule 300 mg, 300 mg, Oral, QHS, Mayo, Pete Pelt, MD, 300 mg at 10/16/18 2022 .  Gerhardt's butt cream, , Topical, TID, Epifanio Lesches, MD .  HYDROmorphone (DILAUDID) injection 0.5 mg, 0.5 mg, Intravenous, Q4H PRN, Mayo, Pete Pelt, MD, 0.5 mg at 10/15/18 0843 .  mirtazapine (REMERON) tablet 7.5 mg, 7.5 mg, Oral, QHS, Earlie Server, MD, 7.5 mg at 10/16/18 2022 .  multivitamin with minerals tablet 1 tablet, 1 tablet, Oral, Daily,  Epifanio Lesches, MD, 1 tablet at 10/17/18 0747 .  ondansetron (ZOFRAN) tablet 4 mg, 4 mg, Oral, Q6H PRN **OR** ondansetron (ZOFRAN) injection 4 mg, 4 mg, Intravenous, Q6H PRN, Mayo, Pete Pelt, MD .  oxyCODONE (Oxy IR/ROXICODONE) immediate release tablet 5 mg, 5 mg, Oral, Q4H PRN, Mayo, Pete Pelt, MD, 5 mg at 10/16/18 2358 .  pravastatin (PRAVACHOL) tablet 40 mg, 40 mg, Oral, Daily, Mayo, Pete Pelt, MD, 40 mg at 10/17/18 0747 .  prochlorperazine (COMPAZINE) tablet 10 mg, 10 mg, Oral, Q6H PRN, Mayo, Pete Pelt, MD .  vancomycin (VANCOCIN) 50 mg/mL oral solution 125 mg, 125 mg, Oral, QID, Mayo, Pete Pelt, MD, 125 mg at 10/17/18 1422 .  vitamin B-12 (CYANOCOBALAMIN) tablet 1,000 mcg, 1,000 mcg, Oral, Daily, Mayo, Pete Pelt, MD, 1,000 mcg at  10/17/18 0747   Physical exam:  Vitals:   10/16/18 1941 10/17/18 0423 10/17/18 1201 10/17/18 1216  BP: (!) 97/52 (!) 94/55 104/61 110/61  Pulse: (!) 115 (!) 101 86 82  Resp: 16 16 16 18   Temp: (!) 97.5 F (36.4 C) 98.2 F (36.8 C) 98.6 F (37 C) 98.2 F (36.8 C)  TempSrc: Oral Oral Oral Oral  SpO2: 99% 97% 98% 98%    Constitutional: NAD HENT:  Moist oral mucosa, Pupils are equal, round, and reactive to light. EOM are normal.  Cardiovascular: Normal rate and regular rhythm.  Pulmonary/Chest: No respiratory distress  Abdominal: Soft. Mild discomfort with palpation.   Neurological: She is alert.  Skin: Skin is warm and dry. No erythema.  Psychiatric: less anxious today.   Gyn : Left vulvar ulcerated mass extending to the Vagina, not able to fully exam due to pain.  But overall ulcerated mass appears less angry appearance compared to prior to the chemotherapy treatment.  CMP Latest Ref Rng & Units 10/16/2018  Glucose 70 - 99 mg/dL 125(H)  BUN 8 - 23 mg/dL 32(H)  Creatinine 0.44 - 1.00 mg/dL 1.15(H)  Sodium 135 - 145 mmol/L 144  Potassium 3.5 - 5.1 mmol/L 4.2  Chloride 98 - 111 mmol/L 123(H)  CO2 22 - 32 mmol/L 18(L)  Calcium 8.9 - 10.3 mg/dL  7.4(L)  Total Protein 6.5 - 8.1 g/dL -  Total Bilirubin 0.3 - 1.2 mg/dL -  Alkaline Phos 38 - 126 U/L -  AST 15 - 41 U/L -  ALT 0 - 44 U/L -   CBC Latest Ref Rng & Units 10/16/2018  WBC 4.0 - 10.5 K/uL 14.2(H)  Hemoglobin 12.0 - 15.0 g/dL 9.7(L)  Hematocrit 36.0 - 46.0 % 32.4(L)  Platelets 150 - 400 K/uL 236   RADIOGRAPHIC STUDIES: I have personally reviewed the radiological images as listed and agreed with the findings in the report.  Ct Head Wo Contrast  Result Date: 09/29/2018 CLINICAL DATA:  Syncopal episode, dizziness, weakness, fall and head trauma EXAM: CT HEAD WITHOUT CONTRAST TECHNIQUE: Contiguous axial images were obtained from the base of the skull through the vertex without intravenous contrast. COMPARISON:  09/15/2018 FINDINGS: Brain: Stable atrophy pattern and chronic white matter microvascular ischemic changes throughout both cerebral hemispheres. No acute intracranial hemorrhage, new infarction, mass lesion, midline shift, herniation, hydrocephalus, or extra-axial fluid collection. No focal mass effect or edema. Cisterns are patent. Cerebellar atrophy as well. Vascular: Intracranial atherosclerosis.  No hyperdense vessel. Skull: Normal. Negative for fracture or focal lesion. Sinuses/Orbits: No acute orbital finding. Minor right ethmoid mucosal thickening. Other sinuses visualized are clear. Mastoids are clear. Other: None. IMPRESSION: Stable atrophy pattern and chronic white matter microvascular ischemic changes. No acute intracranial abnormality by noncontrast CT. Electronically Signed   By: Jerilynn Mages.  Shick M.D.   On: 09/29/2018 21:08   Ct Abdomen Pelvis W Contrast  Result Date: 10/13/2018 CLINICAL DATA:  Locally advanced vulvar cancer. Now with diarrhea and diffuse abdominal discomfort. EXAM: CT ABDOMEN AND PELVIS WITH CONTRAST TECHNIQUE: Multidetector CT imaging of the abdomen and pelvis was performed using the standard protocol following bolus administration of intravenous  contrast. CONTRAST:  67mL OMNIPAQUE IOHEXOL 300 MG/ML  SOLN COMPARISON:  None. FINDINGS: Lower chest: Large hiatal hernia. Small bilateral pleural effusions. 2-3 mm pulmonary nodule noted at the right base (1/4). Hepatobiliary: No focal abnormality within the liver parenchyma. There is no evidence for gallstones, gallbladder wall thickening, or pericholecystic fluid. No intrahepatic or extrahepatic biliary dilation. Pancreas: No  focal mass lesion. No dilatation of the main duct. No intraparenchymal cyst. No peripancreatic edema. Spleen: No splenomegaly. Tiny low-density subcapsular focus posterior spleen is likely a cyst or pseudocyst. Adrenals/Urinary Tract: No adrenal nodule or mass. Tiny hypoattenuating lesions in each kidney are too small to characterize. No evidence for hydroureter. Mild circumferential bladder wall thickening evident despite underdistention. Stomach/Bowel: Large hiatal hernia with nearly the entire stomach contained in the chest. Duodenum is normally positioned as is the ligament of Treitz. No small bowel wall thickening. No small bowel dilatation. Terminal ileum unremarkable. The appendix is not visualized, but there is no edema or inflammation in the region of the cecum. Right colon is stool-filled. Left colon shows irregular circumferential wall thickening, advanced in the sigmoid colon. There is associated pericolonic edema/inflammation. Prominent stool volume noted in the rectum. Vascular/Lymphatic: There is abdominal aortic atherosclerosis without aneurysm. There is no gastrohepatic or hepatoduodenal ligament lymphadenopathy. No intraperitoneal or retroperitoneal lymphadenopathy. No pelvic sidewall lymphadenopathy. Reproductive: Uterus surgically absent.  There is no adnexal mass. Other: Small volume intraperitoneal free fluid. Musculoskeletal: No worrisome lytic or sclerotic osseous abnormality. Diffuse body wall edema evident. IMPRESSION: 1. Marked edema and irregularity of the colonic  wall, most prominently in the left colon. This is associated with substantial pericolonic edema/inflammation. Imaging features are compatible with the reported history of C diff colitis. 2. Small volume intraperitoneal free fluid with diffuse body wall edema. 3. Very large hiatal hernia. 4. 2-3 mm pulmonary nodule at the right lung base. No follow-up needed if patient is low-risk. Non-contrast chest CT can be considered in 12 months if patient is high-risk. This recommendation follows the consensus statement: Guidelines for Management of Incidental Pulmonary Nodules Detected on CT Images: From the Fleischner Society 2017; Radiology 2017; 284:228-243. 5.  Aortic Atherosclerois (ICD10-170.0) Electronically Signed   By: Misty Stanley M.D.   On: 10/13/2018 18:11    Assessment and plan-  Patient is a 77 y.o. female with history of recurrent locally advanced vulvar cancer, currently on chemotherapy, A. fib on Coumadin, hypertension, anxiety/depression presents for evaluation of abdominal discomfort and C. difficile colitis.  #C. difficile colitis/diarrhea, Continue with oral vancomycin daily.  Clinically improving with less diarrhea episodes. #Hypotension, Likely secondary to hypovolemia. improved.  Continue hold blood pressure medication.   #Recurrent vulvar cancer,  overall poor prognosis.  Not a surgical candidate. Patient desires treatment. She is due for chemotherapy next Friday.  Will reassess her condition prior to the chemotherapy if she is discharged. Palliative care service on board. Continue wound care and PT evaluation.  Patient declined PT evaluation today.  Primary team consulted the psychiatry team for further evaluation of patient's capacity of making decisions.  # neoplasm related pain: Continue fentanyl patch and oxycodone  # Recent bacteremia,s/p IV antibiotics course.  recent peripheral blood culture negative.  # Anxiety and depression.  Continue Remeron QHS # Supratherapeutic INR  3.63.  Continue hold Coumadin.  Persistent supratherapeutic INR likely due to diarrhea and antibiotic use and decrease gut flora.  Continue monitor..   Thank you for allowing me to participate in the care of this patient.  We will continue to follow her inpatient course. Discussed with hospitalist team about above plan.  Earlie Server, MD, PhD Hematology Oncology [facility] Pager- [phone] 10/17/2018

## 2018-10-17 NOTE — Consult Note (Signed)
[facility] Face-to-Face Psychiatry Consult   Reason for Consult: Consult for this 77 year old woman with multiple medical problems including metastatic vulvar cancer.  Question about possible depression Referring Physician: Tressia Miners Patient Identification: Virginia Murray MRN:  [id number] Principal Diagnosis: Grief Diagnosis:   Patient Active Problem List   Diagnosis Date Noted  . Grief [F43.21] 10/17/2018  . Hypotension due to hypovolemia [I95.89, E86.1]   . Depression [F32.9]   . Elevated INR [R79.1]   . Hypomagnesemia [E83.42] 10/13/2018  . Generalized abdominal pain [R10.84]   . C. difficile colitis [A04.72] 10/11/2018  . Diarrhea [R19.7] 10/10/2018  . Nocturnal leg cramps [G47.62] 10/07/2018  . Atrial fibrillation ([place]) [I48.91] 09/26/2018  . Long term (current) use of anticoagulants [Z79.01] 09/26/2018  . Hyperlipidemia [E78.5] 09/26/2018  . Constipation due to opioid therapy [K59.03, T40.2X5A] 09/26/2018  . Generalized anxiety disorder [F41.1]   . Agitation [R45.1]   . Vulvar cancer ([place]) [C51.9] 09/03/2018  . Goals of care, counseling/discussion [Z71.89] 09/03/2018    Total Time spent with patient: 1 hour  Subjective:   Virginia Murray is a 77 y.o. female patient admitted with "why do not you leave me alone?".  HPI: Patient seen chart reviewed.  77 year old woman who is in the hospital with hypotension multiple medical problems complications of cancer.  Additionally her husband died within the last month.  Patient was partially cooperative with interview at least initially.  Said that she was having some pain but not too bad.  Energy level very low.  Trouble sleeping at night.  Mood is down and anxious and stressed a lot of which she directly relates to the recent death of her husband.  She denies any suicidal thoughts.  Denies any current psychotic symptoms.  Apparently part of what triggered the consult was a concern about visual hallucinations.  Patient says earlier she  was seeing some spots that look like bugs flying around in the air but she has no delusional thoughts about it and is not having any of that currently.  Social history: Patient moved here from [place] fairly recently and is staying with her adult children.  Her husband recently passed away.  Medical history: Multiple severe medical problems including metastatic vulvar cancer with resultant need for surgery and extensive cancer treatment.  History of low magnesium recent hypotension  Substance abuse history: No alcohol or drug abuse  Past Psychiatric History: Patient says she has never seen a psychiatrist mental health care provider of any sort before.  No hospitalization.  No suicide attempts.  Was given low doses of Xanax for treatment of anxiety but she says that it made her fall down so she is not taking it anymore.  Risk to Self:   Risk to Others:   Prior Inpatient Therapy:   Prior Outpatient Therapy:    Past Medical History:  Past Medical History:  Diagnosis Date  . A-fib ([place])   . Cancer (H[facility])    vulva   . Depression   . Diverticulitis   . Hyperlipemia   . Hypertension   . Vulva cancer [facility])     Past Surgical History:  Procedure Laterality Date  . ABDOMINAL HYSTERECTOMY    . CATARACT EXTRACTION, BILATERAL    . KNEE SURGERY     right knee   . PORTA CATH INSERTION N/A 09/04/2018   Procedure: PORTA CATH INSERTION;  Surgeon: Algernon Huxley, MD;  Location: [place] [facility];  Service: Cardiovascular;  Laterality: N/A;  . TEE WITHOUT CARDIOVERSION N/A 09/16/2018  Procedure: TRANSESOPHAGEAL ECHOCARDIOGRAM (TEE);  Surgeon: Nelva Bush, MD;  Location: [facility] ORS;  Service: Cardiovascular;  Laterality: N/A;  . TONSILLECTOMY    . VULVA SURGERY     Family History:  Family History  Problem Relation Age of Onset  . Alzheimer's disease Mother   . Heart disease Father    Family Psychiatric  History: Denies any Social History:  Social History   Substance and Sexual Activity   Alcohol Use Not Currently     Social History   Substance and Sexual Activity  Drug Use Never    Social History   Socioeconomic History  . Marital status: Widowed    Spouse name: Not on file  . Number of children: 2  . Years of education: Not on file  . Highest education level: Not on file  Occupational History  . Not on file  Social Needs  . Financial resource strain: Not on file  . Food insecurity:    Worry: Not on file    Inability: Not on file  . Transportation needs:    Medical: Not on file    Non-medical: Not on file  Tobacco Use  . Smoking status: Former Smoker    Packs/day: 1.00    Years: 20.00    Pack years: 20.00    Types: Cigarettes    Last attempt to quit: 08/29/1993    Years since quitting: 25.1  . Smokeless tobacco: Never Used  Substance and Sexual Activity  . Alcohol use: Not Currently  . Drug use: Never  . Sexual activity: Not Currently  Lifestyle  . Physical activity:    Days per week: Not on file    Minutes per session: Not on file  . Stress: Not on file  Relationships  . Social connections:    Talks on phone: Not on file    Gets together: Not on file    Attends religious service: Not on file    Active member of club or organization: Not on file    Attends meetings of clubs or organizations: Not on file    Relationship status: Not on file  Other Topics Concern  . Not on file  Social History Narrative  . Not on file   Additional Social History:    Allergies:  No Known Allergies  Labs:  Results for orders placed or performed during the hospital encounter of 10/11/18 (from the past 48 hour(s))  Protime-INR     Status: Abnormal   Collection Time: 10/16/18  3:32 AM  Result Value Ref Range   Prothrombin Time 34.0 (H) 11.4 - 15.2 seconds   INR 3.42     Comment: Performed at [facility], [address] [place], [place] [address]  CBC     Status: Abnormal   Collection Time: 10/16/18  3:32 AM  Result Value Ref Range   WBC  14.2 (H) 4.0 - 10.5 K/uL   RBC 3.81 (L) 3.87 - 5.11 MIL/uL   Hemoglobin 9.7 (L) 12.0 - 15.0 g/dL   HCT 32.4 (L) 36.0 - 46.0 %   MCV 85.0 80.0 - 100.0 fL   MCH 25.5 (L) 26.0 - 34.0 pg   MCHC 29.9 (L) 30.0 - 36.0 g/dL   RDW 17.3 (H) 11.5 - 15.5 %   Platelets 236 150 - 400 K/uL   nRBC 0.0 0.0 - 0.2 %    Comment: Performed at [facility], [address] [place], [place] [address]  Basic metabolic panel     Status: Abnormal  Collection Time: 10/16/18  3:32 AM  Result Value Ref Range   Sodium 144 135 - 145 mmol/L   Potassium 4.2 3.5 - 5.1 mmol/L   Chloride 123 (H) 98 - 111 mmol/L   CO2 18 (L) 22 - 32 mmol/L   Glucose, Bld 125 (H) 70 - 99 mg/dL   BUN 32 (H) 8 - 23 mg/dL   Creatinine, Ser 1.15 (H) 0.44 - 1.00 mg/dL   Calcium 7.4 (L) 8.9 - 10.3 mg/dL   GFR calc non Af Amer 45 (L) >60 mL/min   GFR calc Af Amer 53 (L) >60 mL/min    Comment: (NOTE) The eGFR has been calculated using the CKD EPI equation. This calculation has not been validated in all clinical situations. eGFR's persistently <60 mL/min signify possible Chronic Kidney Disease.    Anion gap 3 (L) 5 - 15    Comment: Performed at [facility], [address] [place], [place] [address]  Protime-INR     Status: Abnormal   Collection Time: 10/17/18  4:32 AM  Result Value Ref Range   Prothrombin Time 35.6 (H) 11.4 - 15.2 seconds   INR 3.63     Comment: Performed at [facility], [address], [place], [place] [address]    Current Facility-Administered Medications  Medication Dose Route Frequency Provider Last Rate Last Dose  . 0.9 %  sodium chloride infusion   Intravenous Continuous Earlie Server, MD 100 mL/hr at 10/17/18 [phone]    . acetaminophen (TYLENOL) tablet 650 mg  650 mg Oral Q6H PRN Mayo, Pete Pelt, MD       Or  . acetaminophen (TYLENOL) suppository 650 mg  650 mg Rectal Q6H PRN Mayo, Pete Pelt, MD      . ALPRAZolam Duanne Moron) tablet 0.25 mg  0.25 mg Oral BID PRN Pyreddy, Reatha Harps, MD      .  feeding supplement (ENSURE ENLIVE) (ENSURE ENLIVE) liquid 237 mL  237 mL Oral TID BM Epifanio Lesches, MD   237 mL at 10/13/18 1436  . fentaNYL (DURAGESIC - dosed mcg/hr) patch 25 mcg  25 mcg Transdermal Q72H Sela Hua, MD   25 mcg at 10/16/18 1548  . gabapentin (NEURONTIN) capsule 300 mg  300 mg Oral QHS Mayo, Pete Pelt, MD   300 mg at 10/16/18 2022  . Gerhardt's butt cream   Topical TID Epifanio Lesches, MD      . HYDROmorphone (DILAUDID) injection 0.5 mg  0.5 mg Intravenous Q4H PRN Mayo, Pete Pelt, MD   0.5 mg at 10/15/18 0843  . mirtazapine (REMERON) tablet 7.5 mg  7.5 mg Oral QHS Earlie Server, MD   7.5 mg at 10/16/18 2022  . multivitamin with minerals tablet 1 tablet  1 tablet Oral Daily Epifanio Lesches, MD   1 tablet at 10/17/18 0747  . ondansetron (ZOFRAN) tablet 4 mg  4 mg Oral Q6H PRN Mayo, Pete Pelt, MD       Or  . ondansetron [facility]) injection 4 mg  4 mg Intravenous Q6H PRN Mayo, Pete Pelt, MD      . oxyCODONE (Oxy IR/ROXICODONE) immediate release tablet 5 mg  5 mg Oral Q4H PRN Mayo, Pete Pelt, MD   5 mg at 10/16/18 2358  . pravastatin (PRAVACHOL) tablet 40 mg  40 mg Oral Daily Mayo, Pete Pelt, MD   40 mg at 10/17/18 0747  . prochlorperazine (COMPAZINE) tablet 10 mg  10 mg Oral Q6H PRN Mayo, Pete Pelt, MD      . vancomycin (  VANCOCIN) 50 mg/mL oral solution 125 mg  125 mg Oral QID Sela Hua, MD   125 mg at 10/17/18 1422  . vitamin B-12 (CYANOCOBALAMIN) tablet 1,000 mcg  1,000 mcg Oral Daily Mayo, Pete Pelt, MD   1,000 mcg at 10/17/18 3419    Musculoskeletal: Strength & Muscle Tone: decreased Gait & Station: unable to stand Patient leans: N/A  Psychiatric Specialty Exam: Physical Exam  Nursing note and vitals reviewed. Constitutional: She appears well-developed.  HENT:  Head: Normocephalic and atraumatic.  Eyes: Pupils are equal, round, and reactive to light. Conjunctivae are normal.  Neck: Normal range of motion.  Cardiovascular: Regular rhythm and normal  heart sounds.  Respiratory: Effort normal. No respiratory distress.  GI: Soft.  Musculoskeletal: Normal range of motion.  Neurological: She is alert.  Skin: Skin is warm and dry.  Psychiatric: Judgment normal. Her affect is blunt. Her speech is delayed. She is slowed. Thought content is not paranoid. Cognition and memory are normal. She exhibits a depressed mood. She expresses no homicidal and no suicidal ideation.    Review of Systems  Constitutional: Negative.   HENT: Negative.   Eyes: Negative.   Respiratory: Negative.   Cardiovascular: Negative.   Gastrointestinal: Negative.   Musculoskeletal: Positive for back pain, falls and joint pain.  Skin: Negative.   Neurological: Negative.   Psychiatric/Behavioral: Positive for depression. Negative for hallucinations, memory loss, substance abuse and suicidal ideas. The patient is not nervous/anxious and does not have insomnia.     Blood pressure 110/61, pulse 82, temperature 98.2 F (36.8 C), temperature source Oral, resp. rate 18, SpO2 98 %.There is no height or weight on file to calculate BMI.  General Appearance: Disheveled  Eye Contact:  Minimal  Speech:  Slow  Volume:  Decreased  Mood:  Dysphoric  Affect:  Depressed and Tearful  Thought Process:  Goal Directed  Orientation:  Full (Time, Place, and Person)  Thought Content:  Logical  Suicidal Thoughts:  No  Homicidal Thoughts:  No  Memory:  Immediate;   Fair Recent;   Poor Remote;   Fair  Judgement:  Fair  Insight:  Fair  Psychomotor Activity:  Decreased  Concentration:  Concentration: Fair  Recall:  [employer] of Knowledge:  Fair  Language:  Fair  Akathisia:  No  Handed:  Right  AIMS (if indicated):     Assets:  Desire for Improvement Housing Resilience Social Support  ADL's:  Impaired  Cognition:  Impaired,  Mild  Sleep:        Treatment Plan Summary: Plan Elderly patient with multiple severe medical problems.  Earlier was having some visual hallucinations  but is not having any psychotic symptoms.  Multiple reasons why this could happen including pain medication sleep deprivation eyesight problems delirium just related to the hospital etc.  Currently she was only partially cooperative but it was clear that she was not delirious.  She is sad down and tearful at times but insists that she does not think she has a major depression.  Completely denies any suicidal thoughts and expresses optimism about getting out of the hospital.  Patient rapidly tired of interview and told me that I should leave her alone which would be the best thing I could do to help her to feel better.  No indication to start any psychiatric medicine at this point no evidence of dangerousness.  We can follow-up only if there is a specific need going forward.  Disposition: No evidence of imminent risk to  self or others at present.   Patient does not meet criteria for psychiatric inpatient admission.  Alethia Berthold, MD 10/17/2018 3:28 PM

## 2018-10-17 NOTE — Progress Notes (Signed)
PT Cancellation Note  Patient Details Name: Virginia Murray MRN: [id number] DOB: Jan 10, 1941   Cancelled Treatment:    Reason Eval/Treat Not Completed: Patient declined, no reason specified.  Pt continues to adamantly refuse working with PT and shows no sign of changing her mind after 3 days of attempting to see her for PT evaluation. PT is signing off.    Collie Siad PT, DPT 10/17/2018, 11:51 AM

## 2018-10-18 ENCOUNTER — Other Ambulatory Visit: Payer: Self-pay | Admitting: Primary Care

## 2018-10-18 DIAGNOSIS — I959 Hypotension, unspecified: Secondary | ICD-10-CM

## 2018-10-18 DIAGNOSIS — F4321 Adjustment disorder with depressed mood: Secondary | ICD-10-CM

## 2018-10-18 DIAGNOSIS — F411 Generalized anxiety disorder: Secondary | ICD-10-CM

## 2018-10-18 DIAGNOSIS — I480 Paroxysmal atrial fibrillation: Secondary | ICD-10-CM

## 2018-10-18 LAB — CBC
HCT: 36.4 % (ref 36.0–46.0)
Hemoglobin: 10.9 g/dL — ABNORMAL LOW (ref 12.0–15.0)
MCH: 26 pg (ref 26.0–34.0)
MCHC: 29.9 g/dL — AB (ref 30.0–36.0)
MCV: 86.9 fL (ref 80.0–100.0)
NRBC: 0 % (ref 0.0–0.2)
Platelets: 238 10*3/uL (ref 150–400)
RBC: 4.19 MIL/uL (ref 3.87–5.11)
RDW: 17.4 % — AB (ref 11.5–15.5)
WBC: 14.1 10*3/uL — ABNORMAL HIGH (ref 4.0–10.5)

## 2018-10-18 LAB — PROTIME-INR
INR: 3.23
Prothrombin Time: 32.5 seconds — ABNORMAL HIGH (ref 11.4–15.2)

## 2018-10-18 MED ORDER — SODIUM CHLORIDE 0.9 % IV BOLUS
1000.0000 mL | Freq: Once | INTRAVENOUS | Status: AC
  Administered 2018-10-18: 1000 mL via INTRAVENOUS

## 2018-10-18 MED ORDER — METOPROLOL TARTRATE 25 MG PO TABS
12.5000 mg | ORAL_TABLET | Freq: Four times a day (QID) | ORAL | Status: DC
  Administered 2018-10-18 – 2018-10-19 (×4): 12.5 mg via ORAL
  Filled 2018-10-18 (×4): qty 1

## 2018-10-18 MED ORDER — DIGOXIN 0.25 MG/ML IJ SOLN
0.2500 mg | INTRAMUSCULAR | Status: AC
  Administered 2018-10-18: 10:00:00 0.25 mg via INTRAVENOUS
  Filled 2018-10-18: qty 2

## 2018-10-18 NOTE — Progress Notes (Signed)
Pnt hypotensive overnight, notified Dr. Jannifer Franklin who ordered 3 separate rounds of boluses of 0.9NS, 1000 ml's per bonus. Finally BP WNL after the third bolus. Tachycardic in the 120-130's, Dr Jannifer Franklin added cardiac monitoring. When connected pnt found to be in AFIB with RVR. Although tachycardic, otherwise symptomaticNotified MD

## 2018-10-18 NOTE — Consult Note (Signed)
[place] for warfarin dosing Indication: atrial fibrillation  No Known Allergies  Labs: Recent Labs    10/16/18 0332 10/17/18 0432 10/18/18 0641  HGB 9.7*  --  10.9*  HCT 32.4*  --  36.4  PLT 236  --  238  LABPROT 34.0* 35.6* 32.5*  INR 3.42 3.63 3.23  CREATININE 1.15*  --   --     Estimated Creatinine Clearance: 33.3 mL/min (A) (by C-G formula based on SCr of 1.15 mg/dL (H)).   Assessment: 77 year-old female female on warfarin 3mg  daily PTA  On a recent admission here in September she exhibited the following response:  DATE   INR      Dose   9/14     1.84     3 mg  9/15     2.73     hold  9/16     2.36     2mg  9/17     2.67     2mg    10/12   5.97     Held 10/13   7.01     Held 10/14   5.3       Held 10/15   8.94     Held - provider ordered IV VitK 1mg  once. 10/16   3.53     Held 10/17   3.42     Held 10/18   3.63     Held 10/19   3.23     Held  Goal of Therapy:  INR 2-3 Monitor platelets by anticoagulation protocol: Yes   Plan:  Her INR remains supratherapeutic today at 3.23. No warfarin today. Follow up daily INR and pharmacy will begin warfarin when her INR is closer to goal range.   Pharmacy will continue to follow.   Courtnee Myer K, RPH 10/18/2018,10:40 AM

## 2018-10-18 NOTE — Consult Note (Signed)
Cardiology Consultation:   Patient ID: Virginia Murray MRN: [id number]; DOB: 03-Feb-1941  Admit date: 10/11/2018 Date of Consult: 10/18/2018  Primary Care Provider: Pleas Koch, NP Primary Cardiologist: No primary care provider on file.  Primary Electrophysiologist:  None    Patient Profile:   Virginia Murray is a 77 y.o. female with a hx of paroxysmal atrial fibrillation, recurrent vulvar cancer, hypertension and hyperlipidemia who is being seen today for the evaluation of atrial fibrillation with rapid ventricular response at the request of Dr. Bridgett Larsson.  History of Present Illness:   Virginia Murray is a 77 year old female with the above medical problems.  The patient was hospitalized in September with gram-negative bacteremia.  TEE was negative for endocarditis.  She was treated with antibiotics but returned back with generalized fatigue and C. difficile colitis.  The patient has known history of paroxysmal atrial fibrillation but was noted to be in sinus rhythm during that previous admission and also on presentation this admission.  However, she developed tachycardia and hypotension over the last few days and telemetry was placed today revealing atrial fibrillation with RVR. The patient seems to be lethargic and cannot provide much history.  The patient's antihypertensive medications have been held including atenolol.  She is on warfarin as an outpatient but INR was above 8 on presentation.  Warfarin continues to be on hold and INR continues to be elevated at 3.23.  Past Medical History:  Diagnosis Date  . A-fib ([facility])   . Cancer (HCC)    vulva   . Depression   . Diverticulitis   . Hyperlipemia   . Hypertension   . Vulva cancer [facility])     Past Surgical History:  Procedure Laterality Date  . ABDOMINAL HYSTERECTOMY    . CATARACT EXTRACTION, BILATERAL    . KNEE SURGERY     right knee   . PORTA CATH INSERTION N/A 09/04/2018   Procedure: PORTA CATH INSERTION;  Surgeon:  Algernon Huxley, MD;  Location: [place] [facility];  Service: Cardiovascular;  Laterality: N/A;  . TEE WITHOUT CARDIOVERSION N/A 09/16/2018   Procedure: TRANSESOPHAGEAL ECHOCARDIOGRAM (TEE);  Surgeon: Nelva Bush, MD;  Location: [facility] ORS;  Service: Cardiovascular;  Laterality: N/A;  . TONSILLECTOMY    . VULVA SURGERY       Home Medications:  Prior to Admission medications   Medication Sig Start Date End Date Taking? Authorizing Provider  atenolol (TENORMIN) 100 MG tablet Take 100 mg by mouth daily.   Yes [provider]  benazepril (LOTENSIN) 40 MG tablet Take 40 mg by mouth daily.   Yes [provider]  dexamethasone (DECADRON) 4 MG tablet Take 2 tablets (8 mg total) by mouth daily. Start the day after chemotherapy for 2 days. 09/03/18  Yes Earlie Server, MD  doxazosin (CARDURA) 8 MG tablet Take 8 mg by mouth daily.   Yes [provider]  fentaNYL (DURAGESIC - DOSED MCG/HR) 25 MCG/HR patch Place 1 patch (25 mcg total) onto the skin every 3 (three) days. 09/30/18  Yes Earlie Server, MD  gabapentin (NEURONTIN) 300 MG capsule Take 1 capsule (300 mg total) by mouth at bedtime. For leg cramps 10/07/18  Yes Verlon Au, NP  lidocaine-prilocaine (EMLA) cream Apply to affected area once 09/03/18  Yes Earlie Server, MD  loperamide (IMODIUM) 2 MG capsule Take 1 capsule (2 mg total) by mouth See admin instructions. take 4mg  followed by 2mg  every 2 hours until diarrhea stops. Maximum: 16 mg/day 10/10/18  Yes Earlie Server, MD  ondansetron (ZOFRAN) 8 MG tablet Take 1 tablet (8 mg total) by mouth 2 (two) times daily as needed for refractory nausea / vomiting. Start on day 3 after chemo. 09/03/18  Yes Earlie Server, MD  oxyCODONE (ROXICODONE) 5 MG immediate release tablet Take 1 tablet (5 mg total) by mouth every 4 (four) hours as needed for moderate pain or severe pain. 09/26/18  Yes Jacquelin Hawking, NP  pravastatin (PRAVACHOL) 40 MG tablet Take 40 mg by mouth daily.   Yes [provider]    prochlorperazine (COMPAZINE) 10 MG tablet Take 1 tablet (10 mg total) by mouth every 6 (six) hours as needed (Nausea or vomiting). 09/03/18  Yes Earlie Server, MD  senna (SENOKOT) 8.6 MG TABS tablet Take 2 tablets (17.2 mg total) by mouth daily. 08/29/18  Yes Earlie Server, MD  sertraline (ZOLOFT) 25 MG tablet Take 1 tablet (25 mg total) by mouth daily. 09/26/18  Yes Pleas Koch, NP  vancomycin (VANCOCIN) 125 MG capsule Take 1 capsule (125 mg total) by mouth 4 (four) times daily. 10/10/18  Yes Corcoran, Drue Second, MD  vitamin B-12 1000 MCG tablet Take 1 tablet (1,000 mcg total) by mouth daily. 09/17/18  Yes Bettey Costa, MD  warfarin (COUMADIN) 1 MG tablet Take 3 tablets (3 mg total) by mouth daily. 09/22/18  Yes Earlie Server, MD  polyethylene glycol [facility]) packet Take 17 g by mouth daily as needed for mild constipation or moderate constipation. Patient not taking: Reported on 10/10/2018 08/29/18   Earlie Server, MD    Inpatient Medications: Scheduled Meds: . feeding supplement (ENSURE ENLIVE)  237 mL Oral TID BM  . fentaNYL  25 mcg Transdermal Q72H  . gabapentin  300 mg Oral QHS  . Gerhardt's butt cream   Topical TID  . metoprolol tartrate  12.5 mg Oral Q6H  . mirtazapine  7.5 mg Oral QHS  . multivitamin with minerals  1 tablet Oral Daily  . pravastatin  40 mg Oral Daily  . vancomycin  125 mg Oral QID  . cyanocobalamin  1,000 mcg Oral Daily   Continuous Infusions: . sodium chloride 150 mL/hr at 10/18/18 0805   PRN Meds: acetaminophen **OR** acetaminophen, ALPRAZolam, HYDROmorphone (DILAUDID) injection, ondansetron **OR** ondansetron (ZOFRAN) IV, oxyCODONE, prochlorperazine  Allergies:   No Known Allergies  Social History:   Social History   Socioeconomic History  . Marital status: Widowed    Spouse name: Not on file  . Number of children: 2  . Years of education: Not on file  . Highest education level: Not on file  Occupational History  . Not on file  Social Needs  . Financial resource  strain: Not on file  . Food insecurity:    Worry: Not on file    Inability: Not on file  . Transportation needs:    Medical: Not on file    Non-medical: Not on file  Tobacco Use  . Smoking status: Former Smoker    Packs/day: 1.00    Years: 20.00    Pack years: 20.00    Types: Cigarettes    Last attempt to quit: 08/29/1993    Years since quitting: 25.1  . Smokeless tobacco: Never Used  Substance and Sexual Activity  . Alcohol use: Not Currently  . Drug use: Never  . Sexual activity: Not Currently  Lifestyle  . Physical activity:    Days per week: Not on file    Minutes per session: Not on file  . Stress: Not on file  Relationships  .  Social connections:    Talks on phone: Not on file    Gets together: Not on file    Attends religious service: Not on file    Active member of club or organization: Not on file    Attends meetings of clubs or organizations: Not on file    Relationship status: Not on file  . Intimate partner violence:    Fear of current or ex partner: Not on file    Emotionally abused: Not on file    Physically abused: Not on file    Forced sexual activity: Not on file  Other Topics Concern  . Not on file  Social History Narrative  . Not on file    Family History:    Family History  Problem Relation Age of Onset  . Alzheimer's disease Mother   . Heart disease Father      ROS:  Please see the history of present illness.   All other ROS reviewed and negative.     Physical Exam/Data:   Vitals:   10/18/18 0058 10/18/18 0115 10/18/18 0203 10/18/18 0503  BP: (!) 90/52  114/78 119/63  Pulse: (!) 101 (!) 104 (!) 112 (!) 103  Resp: 18   17  Temp: 98 F (36.7 C)   (!) 97.4 F (36.3 C)  TempSrc: Oral   Oral  SpO2: 99% 96% 100% 93%    Intake/Output Summary (Last 24 hours) at 10/18/2018 1252 Last data filed at 10/18/2018 0600 Gross per 24 hour  Intake 4100 ml  Output -  Net 4100 ml   There were no vitals filed for this visit. There is no  height or weight on file to calculate BMI.  General: She appears ill and frail. HEENT: normal Lymph: no adenopathy Neck: no JVD Endocrine:  No thryomegaly Vascular: No carotid bruits; FA pulses 2+ bilaterally without bruits  Cardiac: Irregularly irregular and mildly tachycardic. Lungs:  clear to auscultation bilaterally, no wheezing, rhonchi or rales  Abd: soft, nontender, no hepatomegaly  Ext: no edema Musculoskeletal:  No deformities, BUE and BLE strength normal and equal Skin: warm and dry  Neuro:  CNs 2-12 intact, no focal abnormalities noted Psych: Flat affect   Telemetry:  Telemetry was personally reviewed and demonstrates: Atrial fibrillation with rapid ventricular response  Relevant CV Studies: Echocardiogram in September 2019 showed normal LV systolic function and no significant valvular abnormalities  Laboratory Data:  Chemistry Recent Labs  Lab 10/11/18 1413 10/12/18 0428 10/16/18 0332  NA 140 137 144  K 4.6 4.3 4.2  CL 113* 110 123*  CO2 22 24 18*  GLUCOSE 97 99 125*  BUN 30* 26* 32*  CREATININE 0.70 0.82 1.15*  CALCIUM 8.1* 7.5* 7.4*  GFRNONAA >60 >60 45*  GFRAA >60 >60 53*  ANIONGAP 5 3* 3*    Recent Labs  Lab 10/11/18 1413  PROT 5.1*  ALBUMIN 2.2*  AST 16  ALT 11  ALKPHOS 54  BILITOT 0.5   Hematology Recent Labs  Lab 10/15/18 0500 10/16/18 0332 10/18/18 0641  WBC 20.9* 14.2* 14.1*  RBC 4.14 3.81* 4.19  HGB 10.6* 9.7* 10.9*  HCT 35.0* 32.4* 36.4  MCV 84.5 85.0 86.9  MCH 25.6* 25.5* 26.0  MCHC 30.3 29.9* 29.9*  RDW 17.2* 17.3* 17.4*  PLT 248 236 238   Cardiac EnzymesNo results for input(s): TROPONINI in the last 168 hours. No results for input(s): TROPIPOC in the last 168 hours.  BNPNo results for input(s): BNP, PROBNP in the last 168 hours.  DDimer No results for input(s): DDIMER in the last 168 hours.  Radiology/Studies:  No results found.  Assessment and Plan:   1. Paroxysmal atrial fibrillation: Currently with rapid  ventricular response in the setting of what seems to be volume depletion and underlying infections.  I elected to start the patient on small dose metoprolol with holding parameters depending on her blood pressure.  She is anticoagulated with warfarin which has been on hold due to elevated INR. 2. Hypotension with mild acute renal failure: Likely volume depletion due to poor oral intake.  Continue IV fluids. 3. C. difficile colitis: Oral vancomycin. 4. Recent gram-negative bacteremia: Repeat cultures are negative. 5. Recurrent vulvar cancer: Not a candidate for repeat surgery.  Overall prognosis seems to be poor.  Comfort care is being considered.      For questions or updates, please contact [place] Please consult [email or web address] for contact info under     Signed, Kathlyn Sacramento, MD  10/18/2018 12:52 PM

## 2018-10-18 NOTE — Progress Notes (Signed)
[facility] at [place] NAME: Virginia Murray    MR#:  [id number]  DATE OF BIRTH:  November 27, 1941  Overall in a good more than yesterday, and says still abdominal pain and diarrhea on and off.  Not eating much.  And refusing physical therapy.  CHIEF COMPLAINT:   Chief Complaint  Patient presents with  . Diarrhea  . Vaginal Bleeding   Generalized weakness, small diarrhea to episode.  Hypotension and tachycardia last night.  The patient was given bolus normal saline. REVIEW OF SYSTEMS:   Review of Systems  Constitutional: Positive for malaise/fatigue. Negative for chills and fever.  HENT: Negative for hearing loss.   Eyes: Negative for blurred vision, double vision and photophobia.  Respiratory: Negative for cough, hemoptysis and shortness of breath.   Cardiovascular: Negative for palpitations, orthopnea and leg swelling.  Gastrointestinal: Positive for diarrhea. Negative for abdominal pain and vomiting.  Genitourinary: Negative for dysuria and urgency.  Musculoskeletal: Negative for myalgias and neck pain.  Skin: Negative for rash.  Neurological: Negative for dizziness, focal weakness, seizures, weakness and headaches.  Psychiatric/Behavioral: The patient does not have insomnia.    DRUG ALLERGIES:  No Known Allergies  VITALS:  Blood pressure 117/69, pulse 74, temperature 98.2 F (36.8 C), temperature source Oral, resp. rate 16, SpO2 (!) 88 %.  PHYSICAL EXAMINATION:  GENERAL:  77 y.o.-year-old patient lying in the bed with no acute distress.  Appears cachectic.  Appears pale as well. EYES: Pupils equal, round, reactive to light . No scleral icterus. Extraocular muscles intact.  HEENT: Head atraumatic, normocephalic. Oropharynx and nasopharynx clear.  NECK:  Supple, no jugular venous distention. No thyroid enlargement, no tenderness.  LUNGS: Normal breath sounds bilaterally, no wheezing, rales,rhonchi or crepitation. No use of  accessory muscles of respiration.  CARDIOVASCULAR: S1, S2 normal. No murmurs, rubs, or gallops.  ABDOMEN abdomen is soft, nontender, nondistended, bowel sounds present. eXTREMITIES: No pedal edema, cyanosis, or clubbing.  NEUROLOGIC: Cranial nerves II through XII are intact. Muscle strength 5/5 in all extremities. Sensation intact. Gait not checked.  PSYCHIATRIC: The patient is alert and oriented x 3.  SKIN: Noted to have excoriation in the vaginal area and also rectal area.   LABORATORY PANEL:   CBC Recent Labs  Lab 10/18/18 0641  WBC 14.1*  HGB 10.9*  HCT 36.4  PLT 238   ------------------------------------------------------------------------------------------------------------------  Chemistries  Recent Labs  Lab 10/11/18 1413  10/16/18 0332  NA 140   < > 144  K 4.6   < > 4.2  CL 113*   < > 123*  CO2 22   < > 18*  GLUCOSE 97   < > 125*  BUN 30*   < > 32*  CREATININE 0.70   < > 1.15*  CALCIUM 8.1*   < > 7.4*  AST 16  --   --   ALT 11  --   --   ALKPHOS 54  --   --   BILITOT 0.5  --   --    < > = values in this interval not displayed.   ------------------------------------------------------------------------------------------------------------------  Cardiac Enzymes No results for input(s): TROPONINI in the last 168 hours. ------------------------------------------------------------------------------------------------------------------  RADIOLOGY:  No results found.  EKG:   Orders placed or performed during the hospital encounter of 09/29/18  . ED EKG  . ED EKG  . EKG    ASSESSMENT AND PLAN:   #1. C. difficile colitis:  Continue p.o. Vancomycin (7 more days),  continue enteric precautions, p continue p.o. vancomycin, for 14 days total.  CT abdomen showed Marked edema and irregularity of colonic wall consistent with colitis.  Continue IV fluids, will use PRN boluses for hypotension, WBC also decreased to 14.1 from peak of 22.1   2.  Chronic hypotension:  Continue IV fluids, continue to hold Norvasc, atenolol, benazepril.   . #3 .  Paroxysmal atrial fibrillation RVR with Supratherapeutic INR up to 8.94 , status post vitamin K, high INR is still more than 3.  Hold Coumadin, no evidence of bleeding at this time. IV digoxin stat.  Per Dr. Fletcher Anon, Cardiology consult, small dose metoprolol with holding parameters depending on her blood pressure.    #4 .recent GBS bacteremia, hospitalized from 9/14 , 9/18, patient received Rocephin for 2 weeks via port, repeat blood cultures from 101/1 at oncology office did not show any growth for 24 hours.  5.  Recurrent vulvar cancer, patient is undergoing chemotherapy, history of radical vulvectomy in 2007, follows up with Dr. Tasia Catchings as an outpatient, according to Dr. Collie Siad note patient is not a candidate for further gynecological surgery due to her poor performance status.  Dr. Tasia Catchings discussed with the event oncology Dr. Theora Gianotti.  Patient seen by palliative care team Josh as an outpatient.  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 She is due for chemotherapy next Friday.  Will reassess her condition prior to the chemotherapy if she is discharged per Dr. Tasia Catchings.  Patient lost her husband recently and has been having some hallucinations, No indication to start any psychiatric medicine at this point no evidence of dangerousness per Dr. Weber Cooks.  #6. hyperlipidemia: Continue statins. Spoke with patient daughter-in-law Virginia Murray. patient's daughter is coming from out of town today,   #7 ,poor motivation, refusing physical therapy, wondering if she can be on  comfort care appreciate palliative care input.  More than 50% time spent in counseling, coordination of care. All the records are reviewed and case discussed with Care Management/Social Workerr. Management plans discussed with the patient, family and they are in agreement.  CODE STATUS:  DNR  TOTAL TIME TAKING CARE OF THIS PATIENT: 37 minutes.   POSSIBLE D/C IN 2 DAYS, DEPENDING ON CLINICAL CONDITION.   Demetrios Loll M.D on 10/18/2018 at 2:11 PM  Between 7am to 6pm - Pager - [phone]  After 6pm go to [email or web address] - password [email or web address] [facility]  [place] [facility]  Office  [phone]  CC: Primary care physician; Pleas Koch, NP   Note: This dictation was prepared with Dragon dictation along with smaller phrase technology. Any transcriptional errors that result from this process are unintentional.

## 2018-10-18 NOTE — Progress Notes (Signed)
Daughter in law Sharyn Lull is concerned about patient's status. She would like to set up a meeting with the palliative care team to discuss options.

## 2018-10-19 LAB — BASIC METABOLIC PANEL
Anion gap: 5 (ref 5–15)
BUN: 18 mg/dL (ref 8–23)
CO2: 16 mmol/L — ABNORMAL LOW (ref 22–32)
CREATININE: 1 mg/dL (ref 0.44–1.00)
Calcium: 7.1 mg/dL — ABNORMAL LOW (ref 8.9–10.3)
Chloride: 125 mmol/L — ABNORMAL HIGH (ref 98–111)
GFR calc Af Amer: >60 mL/min (ref >60)
GFR, EST NON AFRICAN AMERICAN: 54 mL/min — AB (ref >60)
GLUCOSE: 129 mg/dL — AB (ref 70–99)
POTASSIUM: 4.5 mmol/L (ref 3.5–5.1)
Sodium: 146 mmol/L — ABNORMAL HIGH (ref 135–145)

## 2018-10-19 LAB — CBC
HEMATOCRIT: 37.8 % (ref 36.0–46.0)
Hemoglobin: 11 g/dL — ABNORMAL LOW (ref 12.0–15.0)
MCH: 26.1 pg (ref 26.0–34.0)
MCHC: 29.1 g/dL — ABNORMAL LOW (ref 30.0–36.0)
MCV: 89.8 fL (ref 80.0–100.0)
PLATELETS: 256 10*3/uL (ref 150–400)
RBC: 4.21 MIL/uL (ref 3.87–5.11)
RDW: 17.5 % — AB (ref 11.5–15.5)
WBC: 16.3 10*3/uL — ABNORMAL HIGH (ref 4.0–10.5)
nRBC: 0 % (ref 0.0–0.2)

## 2018-10-19 LAB — MAGNESIUM: MAGNESIUM: 1.3 mg/dL — AB (ref 1.7–2.4)

## 2018-10-19 LAB — PROTIME-INR
INR: 3.25
Prothrombin Time: 32.7 seconds — ABNORMAL HIGH (ref 11.4–15.2)

## 2018-10-19 MED ORDER — METOPROLOL TARTRATE 25 MG PO TABS
25.0000 mg | ORAL_TABLET | Freq: Four times a day (QID) | ORAL | Status: DC
  Administered 2018-10-19 – 2018-10-20 (×4): 25 mg via ORAL
  Filled 2018-10-19 (×6): qty 1

## 2018-10-19 MED ORDER — MAGNESIUM SULFATE 4 GM/100ML IV SOLN
4.0000 g | Freq: Once | INTRAVENOUS | Status: AC
  Administered 2018-10-19: 11:00:00 4 g via INTRAVENOUS
  Filled 2018-10-19: qty 100

## 2018-10-19 MED ORDER — MIDODRINE HCL 5 MG PO TABS
5.0000 mg | ORAL_TABLET | Freq: Three times a day (TID) | ORAL | Status: DC
  Administered 2018-10-19 – 2018-10-23 (×13): 5 mg via ORAL
  Filled 2018-10-19 (×13): qty 1

## 2018-10-19 NOTE — Progress Notes (Addendum)
[facility] at [place] NAME: Virginia Murray    MR#:  [id number]  DATE OF BIRTH:  [birth year]  Overall in a good more than yesterday, and says still abdominal pain and diarrhea on and off.  Not eating much.  And refusing physical therapy.  CHIEF COMPLAINT:   Chief Complaint  Patient presents with  . Diarrhea  . Vaginal Bleeding   The patient has small diarrhea one episode.  She feels better. REVIEW OF SYSTEMS:   Review of Systems  Constitutional: Positive for malaise/fatigue. Negative for chills and fever.  HENT: Negative for hearing loss.   Eyes: Negative for blurred vision, double vision and photophobia.  Respiratory: Negative for cough, hemoptysis and shortness of breath.   Cardiovascular: Negative for palpitations, orthopnea and leg swelling.  Gastrointestinal: Positive for diarrhea. Negative for abdominal pain and vomiting.  Genitourinary: Negative for dysuria and urgency.  Musculoskeletal: Negative for myalgias and neck pain.  Skin: Negative for rash.  Neurological: Negative for dizziness, focal weakness, seizures, weakness and headaches.  Psychiatric/Behavioral: The patient does not have insomnia.    DRUG ALLERGIES:  No Known Allergies  VITALS:  Blood pressure (!) 89/46, pulse 92, temperature 98.1 F (36.7 C), resp. rate (!) 24, SpO2 97 %.  PHYSICAL EXAMINATION:  GENERAL:  77 y.o.-year-old patient lying in the bed with no acute distress.  Appears cachectic.  Appears pale as well. EYES: Pupils equal, round, reactive to light . No scleral icterus. Extraocular muscles intact.  HEENT: Head atraumatic, normocephalic. Oropharynx and nasopharynx clear.  NECK:  Supple, no jugular venous distention. No thyroid enlargement, no tenderness.  LUNGS: Normal breath sounds bilaterally, no wheezing, rales,rhonchi or crepitation. No use of accessory muscles of respiration.  CARDIOVASCULAR: S1, S2 normal. No murmurs, rubs, or gallops.   ABDOMEN abdomen is soft, nontender, nondistended, bowel sounds present. eXTREMITIES: No pedal edema, cyanosis, or clubbing.  NEUROLOGIC: Cranial nerves II through XII are intact. Muscle strength 5/5 in all extremities. Sensation intact. Gait not checked.  PSYCHIATRIC: The patient is alert and oriented x 3.  SKIN: Noted to have excoriation in the vaginal area and also rectal area.   LABORATORY PANEL:   CBC Recent Labs  Lab 10/19/18 0632  WBC 16.3*  HGB 11.0*  HCT 37.8  PLT 256   ------------------------------------------------------------------------------------------------------------------  Chemistries  Recent Labs  Lab 10/19/18 0632  NA 146*  K 4.5  CL 125*  CO2 16*  GLUCOSE 129*  BUN 18  CREATININE 1.00  CALCIUM 7.1*  MG 1.3*   ------------------------------------------------------------------------------------------------------------------  Cardiac Enzymes No results for input(s): TROPONINI in the last 168 hours. ------------------------------------------------------------------------------------------------------------------  RADIOLOGY:  No results found.  EKG:   Orders placed or performed during the hospital encounter of 09/29/18  . ED EKG  . ED EKG  . EKG    ASSESSMENT AND PLAN:   #1. C. difficile colitis:  Continue p.o. Vancomycin (6 more days), continue enteric precautions, p continue p.o. vancomycin, for 14 days total.  CT abdomen showed Marked edema and irregularity of colonic wall consistent with colitis.  WBC decreased to 14.1 from peak of 22.1, but 16.3 today.  2.  Chronic hypotension: Continue IV fluids, will use PRN boluses for hypotension, continue to hold Norvasc, atenolol, benazepril.  Start Midodrin. . #3 .  Paroxysmal atrial fibrillation RVR with Supratherapeutic INR up to 8.94 , status post vitamin K, high INR is still more than 3.  Hold Coumadin, no evidence of bleeding at this time. IV digoxin  given.  Per Dr. Fletcher Anon, Lopressor is  increased to 25 mg every 6 hours, but the patient blood pressure decreased to 89/46.  Hold Lopressor if BP is low.  #4 .recent GBS bacteremia, hospitalized from 9/14 , 9/18, patient received Rocephin for 2 weeks via port, repeat blood cultures from 101/1 at oncology office did not show any growth for 24 hours.  5.  Recurrent vulvar cancer, patient is undergoing chemotherapy, history of radical vulvectomy in 2007, follows up with Dr. Tasia Catchings as an outpatient, according to Dr. Collie Siad note patient is not a candidate for further gynecological surgery due to her poor performance status.  Dr. Tasia Catchings discussed with the event oncology Dr. Theora Gianotti.  Patient seen by palliative care team Josh as an outpatient.  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 She is due for chemotherapy next Friday.  Will reassess her condition prior to the chemotherapy if she is discharged per Dr. Tasia Catchings.  Patient lost her husband recently and has been having some hallucinations, No indication to start any psychiatric medicine at this point no evidence of dangerousness per Dr. Weber Cooks.  #6. hyperlipidemia: Continue statins.  #7 Poor motivation, refused physical therapy. She feels better today.  Hypomagnesemia.  Given IV magnesium and follow-up level.  More than 50% time spent in counseling, coordination of care. All the records are reviewed and case discussed with Care Management/Social Workerr. Management plans discussed with the patient, daugher-in-law and they are in agreement.  CODE STATUS: DNR  TOTAL TIME TAKING CARE OF THIS PATIENT: 27 minutes.   POSSIBLE D/C IN 2 DAYS, DEPENDING ON CLINICAL CONDITION.   Demetrios Loll M.D on 10/19/2018 at 1:57 PM  Between 7am to 6pm - Pager - [phone]  After 6pm go to [email or web address] - password [email or web address] [facility]  [place] [facility]  Office  [phone]  CC: Primary care physician; Pleas Koch,  NP   Note: This dictation was prepared with Dragon dictation along with smaller phrase technology. Any transcriptional errors that result from this process are unintentional.

## 2018-10-19 NOTE — Plan of Care (Signed)

## 2018-10-19 NOTE — Consult Note (Signed)
[place] for warfarin dosing Indication: atrial fibrillation  No Known Allergies  Labs: Recent Labs    10/17/18 0432 10/18/18 0641 10/19/18 0632  HGB  --  10.9* 11.0*  HCT  --  36.4 37.8  PLT  --  238 256  LABPROT 35.6* 32.5* 32.7*  INR 3.63 3.23 3.25  CREATININE  --   --  1.00    Estimated Creatinine Clearance: 38.3 mL/min (by C-G formula based on SCr of 1 mg/dL).   Assessment: 77 year-old female female on warfarin 3mg  daily PTA  On a recent admission here in September she exhibited the following response:  DATE   INR      Dose   9/14     1.84     3 mg  9/15     2.73     hold  9/16     2.36     2mg  9/17     2.67     2mg    10/12   5.97     Held 10/13   7.01     Held 10/14   5.3       Held 10/15   8.94     Held - provider ordered IV VitK 1mg  once. 10/16   3.53     Held 10/17   3.42     Held 10/18   3.63     Held 10/19   3.23     Held 10/20   3.25     Held  Goal of Therapy:  INR 2-3 Monitor platelets by anticoagulation protocol: Yes   Plan:  INR remains supratherapeutic today. No warfarin today.  Follow up daily INR and pharmacy will begin warfarin when her INR is closer to goal range.   Pharmacy will continue to follow and adjust as per consult.   Meer Reindl K, [place] 10/19/2018,8:14 AM

## 2018-10-19 NOTE — Progress Notes (Addendum)
Advanced Care Plan.  Purpose of Encounter: Palliative care Parties in Attendance: The patient, her daughter-in-law and me. Patient's Decisional Capacity: Yes Medical Story: Virginia Murray  is a 77 y.o. female with a known history of vulvar cancer, HTN, HLD, and paroxysmal a-fib.  She is admitted for C. difficile colitis, hypotension, A. fib with RVR.  She has a very poor oral intake and her condition is declining.  I discussed with patient and her daughter-in-law about her current condition, poor prognosis and palliative care.  The daughter-in-law wants to set up family meeting with palliative care team. Goals of Care Determinations: Palliative care. Plan:  Code Status: DNR. Time spent discussing advance care planning: 20 minutes.

## 2018-10-19 NOTE — Progress Notes (Signed)
Progress Note  Patient Name: Virginia Murray Date of Encounter: 10/19/2018  Primary Cardiologist: No primary care provider on file.   Subjective   She seems to be much more alert today and reports significant improvement in fatigue.  She is no longer hypotensive.  She continues to be in atrial fibrillation and remains tachycardic in the 120-130 range.  Inpatient Medications    Scheduled Meds: . feeding supplement (ENSURE ENLIVE)  237 mL Oral TID BM  . fentaNYL  25 mcg Transdermal Q72H  . gabapentin  300 mg Oral QHS  . Gerhardt's butt cream   Topical TID  . metoprolol tartrate  25 mg Oral Q6H  . mirtazapine  7.5 mg Oral QHS  . multivitamin with minerals  1 tablet Oral Daily  . pravastatin  40 mg Oral Daily  . vancomycin  125 mg Oral QID  . cyanocobalamin  1,000 mcg Oral Daily   Continuous Infusions: . magnesium sulfate 1 - 4 g bolus IVPB     PRN Meds: acetaminophen **OR** acetaminophen, ALPRAZolam, HYDROmorphone (DILAUDID) injection, ondansetron **OR** ondansetron (ZOFRAN) IV, oxyCODONE, prochlorperazine   Vital Signs    Vitals:   10/18/18 2005 10/18/18 2006 10/19/18 0013 10/19/18 0545  BP: (!) 90/57  124/64 120/75  Pulse: 79 (!) 115 (!) 102 (!) 105  Resp:    (!) 24  Temp:  98.1 F (36.7 C)    TempSrc:      SpO2: 94% 100%  97%   No intake or output data in the 24 hours ending 10/19/18 1027 There were no vitals filed for this visit.  Telemetry    Atrial fibrillation with ventricular rate in the 130 range.- Personally Reviewed  ECG     - Personally Reviewed  Physical Exam   GEN: No acute distress but she does appear to be frail. Neck: No JVD Cardiac:  Irregularly irregular and mildly tachycardic., no murmurs, rubs, or gallops.  Respiratory: Clear to auscultation bilaterally. GI: Soft, nontender, non-distended  MS: No edema; No deformity. Neuro:  Nonfocal  Psych: She seems to be in better spirits today than yesterday.  Labs    Chemistry Recent  Labs  Lab 10/16/18 0332 10/19/18 0632  NA 144 146*  K 4.2 4.5  CL 123* 125*  CO2 18* 16*  GLUCOSE 125* 129*  BUN 32* 18  CREATININE 1.15* 1.00  CALCIUM 7.4* 7.1*  GFRNONAA 45* 54*  GFRAA 53* >60  ANIONGAP 3* 5     Hematology Recent Labs  Lab 10/16/18 0332 10/18/18 0641 10/19/18 0632  WBC 14.2* 14.1* 16.3*  RBC 3.81* 4.19 4.21  HGB 9.7* 10.9* 11.0*  HCT 32.4* 36.4 37.8  MCV 85.0 86.9 89.8  MCH 25.5* 26.0 26.1  MCHC 29.9* 29.9* 29.1*  RDW 17.3* 17.4* 17.5*  PLT 236 238 256    Cardiac EnzymesNo results for input(s): TROPONINI in the last 168 hours. No results for input(s): TROPIPOC in the last 168 hours.   BNPNo results for input(s): BNP, PROBNP in the last 168 hours.   DDimer No results for input(s): DDIMER in the last 168 hours.   Radiology    No results found.  Cardiac Studies   Echocardiogram in September 2019 showed normal LV systolic function and no significant valvular abnormalities  Patient Profile     77 y.o. female with history of paroxysmal atrial fibrillation on long-term anticoagulation with warfarin, recurrent vulvar cancer, hypertension and hyperlipidemia who presented with C. difficile colitis and dehydration and developed atrial fibrillation with rapid ventricular response.  Assessment & Plan    1.  Paroxysmal atrial fibrillation: Ventricular rate improved with the addition of metoprolol but still not optimal.  I increase metoprolol to 25 mg every 6 hours.  She is chronically anticoagulated with warfarin which is currently on hold due to elevated INR.  We should consider switching her to DOAC. 2.  Hypertension on mild acute renal failure: Resolved with hydration. 3.  C. difficile colitis: On oral vancomycin. 4.  Recurrent vulvar cancer: Not A candidate for repeat surgery and overall prognosis seems to be poor.     For questions or updates, please contact [place] Please consult [email or web address] for contact info under         Signed, Kathlyn Sacramento, MD  10/19/2018, 10:27 AM

## 2018-10-20 ENCOUNTER — Ambulatory Visit: Payer: Medicare Other | Admitting: Primary Care

## 2018-10-20 LAB — MAGNESIUM: MAGNESIUM: 2.1 mg/dL (ref 1.7–2.4)

## 2018-10-20 LAB — CBC
HCT: 32.6 % — ABNORMAL LOW (ref 36.0–46.0)
Hemoglobin: 9.7 g/dL — ABNORMAL LOW (ref 12.0–15.0)
MCH: 25.6 pg — AB (ref 26.0–34.0)
MCHC: 29.8 g/dL — ABNORMAL LOW (ref 30.0–36.0)
MCV: 86 fL (ref 80.0–100.0)
NRBC: 0 % (ref 0.0–0.2)
PLATELETS: 268 10*3/uL (ref 150–400)
RBC: 3.79 MIL/uL — AB (ref 3.87–5.11)
RDW: 18 % — ABNORMAL HIGH (ref 11.5–15.5)
WBC: 16.2 10*3/uL — ABNORMAL HIGH (ref 4.0–10.5)

## 2018-10-20 LAB — PROTIME-INR
INR: 2.84
Prothrombin Time: 29.4 seconds — ABNORMAL HIGH (ref 11.4–15.2)

## 2018-10-20 MED ORDER — BOOST / RESOURCE BREEZE PO LIQD CUSTOM
1.0000 | Freq: Three times a day (TID) | ORAL | Status: DC
  Administered 2018-10-21 (×2): 1 via ORAL

## 2018-10-20 MED ORDER — WARFARIN - PHARMACIST DOSING INPATIENT: Freq: Every day | Status: DC

## 2018-10-20 MED ORDER — WARFARIN SODIUM 3 MG PO TABS
3.0000 mg | ORAL_TABLET | Freq: Every day | ORAL | Status: DC
  Administered 2018-10-20: 18:00:00 3 mg via ORAL
  Filled 2018-10-20: qty 1

## 2018-10-20 NOTE — Progress Notes (Signed)
[facility] [place] NAME: Virginia Murray    MR#:  [id number]  DATE OF BIRTH:  02/12/1941  Overall in a good more than yesterday, and says still abdominal pain and diarrhea on and off.  Not eating much.  And refusing physical therapy.  CHIEF COMPLAINT:   Chief Complaint  Patient presents with  . Diarrhea  . Vaginal Bleeding   The patient had small diarrhea one episode.  She has no other complains. But she does not like hospital food. REVIEW OF SYSTEMS:   Review of Systems  Constitutional: Positive for malaise/fatigue. Negative for chills and fever.  HENT: Negative for hearing loss.   Eyes: Negative for blurred vision, double vision and photophobia.  Respiratory: Negative for cough, hemoptysis and shortness of breath.   Cardiovascular: Negative for palpitations, orthopnea and leg swelling.  Gastrointestinal: Positive for diarrhea. Negative for abdominal pain and vomiting.  Genitourinary: Negative for dysuria and urgency.  Musculoskeletal: Negative for myalgias and neck pain.  Skin: Negative for rash.  Neurological: Negative for dizziness, focal weakness, seizures, weakness and headaches.  Psychiatric/Behavioral: The patient does not have insomnia.    DRUG ALLERGIES:  No Known Allergies  VITALS:  Blood pressure 96/60, pulse 72, temperature 98.2 F (36.8 C), temperature source Oral, resp. rate 18, SpO2 100 %.  PHYSICAL EXAMINATION:  GENERAL:  77 y.o.-year-old patient lying in the bed with no acute distress.  Appears cachectic.  Appears pale as well. EYES: Pupils equal, round, reactive to light . No scleral icterus. Extraocular muscles intact.  HEENT: Head atraumatic, normocephalic. Oropharynx and nasopharynx clear.  NECK:  Supple, no jugular venous distention. No thyroid enlargement, no tenderness.  LUNGS: Normal breath sounds bilaterally, no wheezing, rales,rhonchi or crepitation. No use of accessory muscles of respiration.   CARDIOVASCULAR: S1, S2 normal. No murmurs, rubs, or gallops.  ABDOMEN abdomen is soft, nontender, nondistended, bowel sounds present. eXTREMITIES: No pedal edema, cyanosis, or clubbing.  NEUROLOGIC: Cranial nerves II through XII are intact. Muscle strength 5/5 in all extremities. Sensation intact. Gait not checked.  PSYCHIATRIC: The patient is alert and oriented x 3.  SKIN: Noted to have excoriation in the vaginal area and also rectal area.   LABORATORY PANEL:   CBC Recent Labs  Lab 10/20/18 0517  WBC 16.2*  HGB 9.7*  HCT 32.6*  PLT 268   ------------------------------------------------------------------------------------------------------------------  Chemistries  Recent Labs  Lab 10/20/18 0517  NA 144  K 4.3  CL 125*  CO2 19*  GLUCOSE 127*  BUN 20  CREATININE 1.12*  CALCIUM 7.2*  MG 2.1   ------------------------------------------------------------------------------------------------------------------  Cardiac Enzymes No results for input(s): TROPONINI in the last 168 hours. ------------------------------------------------------------------------------------------------------------------  RADIOLOGY:  No results found.  EKG:   Orders placed or performed during the hospital encounter of 09/29/18  . ED EKG  . ED EKG  . EKG    ASSESSMENT AND PLAN:   #1. C. difficile colitis:  Continue p.o. Vancomycin (5 more days), continue enteric precautions, p continue p.o. vancomycin, for 14 days total.  CT abdomen showed Marked edema and irregularity of colonic wall consistent with colitis.  WBC decreased to 14.1 from peak of 22.1, but 16.2 today.  Follow-up CBC.  2.  Chronic hypotension: better BP with IV fluids, PRN boluses for hypotension, continue to hold Norvasc, atenolol, benazepril.  Started Midodrin.  #3 .  Paroxysmal atrial fibrillation RVR with Supratherapeutic INR up to 8.94 , status post vitamin K,. INR 2.84 .  Resume Coumadin.  Per Dr. Fletcher Anon, Lopressor is  increased to 25 mg every 6 hours, Hold Lopressor if BP is low.  #4 .recent GBS bacteremia, hospitalized from 9/14 , 9/18, patient received Rocephin for 2 weeks via port, repeat blood cultures from 101/1 at oncology office did not show any growth for 24 hours.  5.  Recurrent vulvar cancer, patient is undergoing chemotherapy, history of radical vulvectomy in 2007, follows up with Dr. Tasia Catchings as an outpatient, according to Dr. Collie Siad note patient is not a candidate for further gynecological surgery due to her poor performance status.  Dr. Tasia Catchings discussed with the event oncology Dr. Theora Gianotti.  Patient seen by palliative care team Josh as an outpatient.  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 She is due for chemotherapy next Friday.  Will reassess her condition prior to the chemotherapy if she is discharged per Dr. Tasia Catchings.  Patient lost her husband recently and has been having some hallucinations, No indication to start any psychiatric medicine at this point no evidence of dangerousness per Dr. Weber Cooks.  #6. hyperlipidemia: Continue statins.  #7.  Generalized weakness.  PT evaluation.  The patient agreed to get PT.  Hypomagnesemia.  Improved with IV magnesium.  I discussed with palliative care nurse practitioner Josh.  He will call the patient's daughter-in-law to set up family meeting.  More than 50% time spent in counseling, coordination of care. All the records are reviewed and case discussed with Care Management/Social Workerr. Management plans discussed with the patient, daugher-in-law and they are in agreement.  CODE STATUS: DNR  TOTAL TIME TAKING CARE OF THIS PATIENT: 28 minutes.   POSSIBLE D/C IN 2 DAYS, DEPENDING ON CLINICAL CONDITION.   Demetrios Loll M.D on 10/20/2018 at 1:14 PM  Between 7am to 6pm - Pager - [phone]  After 6pm go to [email or web address] - password [email or web address] [facility]  [place]  Office   [phone]  CC: Primary care physician; Pleas Koch, NP   Note: This dictation was prepared with Dragon dictation along with smaller phrase technology. Any transcriptional errors that result from this process are unintentional.

## 2018-10-20 NOTE — Progress Notes (Signed)
Nutrition Follow-up  DOCUMENTATION CODES:   Not applicable  INTERVENTION:  Will discontinue Ensure Enlive as patient reports she does not like them.  Provide Boost Breeze po TID, each supplement provides 250 kcal and 9 grams of protein.  Provide Magic cup TID with meals, each supplement provides 290 kcal and 9 grams of protein.  Continue daily MVI.  NUTRITION DIAGNOSIS:   Inadequate oral intake related to diarrhea, acute illness(C-diff) as evidenced by energy intake < 75% for > 7 days.  Ongoing inadequate intake.  GOAL:   Patient will meet greater than or equal to 90% of their needs  Progressing but not met.  MONITOR:   PO intake, Supplement acceptance, Weight trends  REASON FOR ASSESSMENT:   Malnutrition Screening Tool    ASSESSMENT:   77 yo pt ED to hospital admission on 10/12 for diarrhea, abdominal pain, and weakness lasting several days; C-diff positive. Recent 9/14-9/18 admission for GBS bacteremia. Hx of vulvar cancer, HTN, Diverticulitis, Hysterectomy, Vulva surgery, Porta Cath  Met with patient at bedside. She reports her appetite is still not good. She only took bites of her pot roast and sips of her sweet tea. She was working on eating her ice cream but had only finished about 25% of it at time of RD assessment. No family members present. Noted plan was for family meeting to discuss goals of care. Patient reports she does not like the Ensure and does not want to drink it anymore. Will switch patient to another supplement to see if she enjoys it better.  Noted there has not been a weight taken since admission. Recommend measuring patient's weight.  Medications reviewed and include: gabapentin, Remeron 7.5 mg QHS, MVI daily, vancomycin, vitamin B12 1000 micrograms daily, warfarin.  Labs reviewed: CO2 19, Creatinine 1.12.  Diet Order:   Diet Order            Diet regular Room service appropriate? Yes; Fluid consistency: Thin  Diet effective now              EDUCATION NEEDS:   No education needs have been identified at this time  Skin:  Skin Assessment: Skin Integrity Issues:(MSAD to perineum)  Last BM:  10/20/2018 - small type 7  Height:   Ht Readings from Last 1 Encounters:  09/29/18 5' 3"  (1.6 m)    Weight:   Wt Readings from Last 1 Encounters:  10/10/18 49.9 kg    Ideal Body Weight:  52.3 kg  BMI:  There is no height or weight on file to calculate BMI.  Estimated Nutritional Needs:   Kcal:  1225-1350  Protein:  65-80  Fluid:  1.2L  Willey Blade, MS, RD, LDN Office: [phone] Pager: [phone] After Hours/Weekend Pager: [phone]

## 2018-10-20 NOTE — Care Management Important Message (Signed)
Important Message  Patient Details  Name: Nichola Cieslinski MRN: [id number] Date of Birth: 1941-07-20   Medicare Important Message Given:  Yes    Shelbie Ammons, RN 10/20/2018, 2:04 PM

## 2018-10-20 NOTE — Progress Notes (Signed)
Progress Note  Patient Name: Virginia Murray Date of Encounter: 10/20/2018  Primary Cardiologist: No primary care provider on file.   Subjective   The patient seems to be in better spirits today.  She continues to be in atrial fibrillation with ventricular rate between 70 to 120 bpm.  Inpatient Medications    Scheduled Meds: . feeding supplement (ENSURE ENLIVE)  237 mL Oral TID BM  . fentaNYL  25 mcg Transdermal Q72H  . gabapentin  300 mg Oral QHS  . Gerhardt's butt cream   Topical TID  . metoprolol tartrate  25 mg Oral Q6H  . midodrine  5 mg Oral TID WC  . mirtazapine  7.5 mg Oral QHS  . multivitamin with minerals  1 tablet Oral Daily  . pravastatin  40 mg Oral Daily  . vancomycin  125 mg Oral QID  . cyanocobalamin  1,000 mcg Oral Daily  . warfarin  3 mg Oral q1800  . Warfarin - Pharmacist Dosing Inpatient   Does not apply q1800   Continuous Infusions:  PRN Meds: acetaminophen **OR** acetaminophen, ALPRAZolam, HYDROmorphone (DILAUDID) injection, ondansetron **OR** ondansetron (ZOFRAN) IV, oxyCODONE, prochlorperazine   Vital Signs    Vitals:   10/19/18 2241 10/20/18 0510 10/20/18 0515 10/20/18 0834  BP: 110/75 95/64  98/61  Pulse: 95 99 (!) 103 75  Resp:    17  Temp:   97.7 F (36.5 C) (!) 97.5 F (36.4 C)  TempSrc:   Oral Oral  SpO2:   99% 99%    Intake/Output Summary (Last 24 hours) at 10/20/2018 1230 Last data filed at 10/19/2018 2023 Gross per 24 hour  Intake 108.51 ml  Output 1 ml  Net 107.51 ml   There were no vitals filed for this visit.  Telemetry    Atrial fibrillation with ventricular rate between 70-120 bmp.- Personally Reviewed  ECG     - Personally Reviewed  Physical Exam   GEN: No acute distress but she does appear to be frail. Neck: No JVD Cardiac:  Irregularly irregular and mildly tachycardic., no murmurs, rubs, or gallops.  Respiratory: Clear to auscultation bilaterally. GI: Soft, nontender, non-distended  MS: No edema; No  deformity. Neuro:  Nonfocal  Psych: She seems to be in better spirits today than yesterday.  Labs    Chemistry Recent Labs  Lab 10/16/18 0332 10/19/18 0632 10/20/18 0517  NA 144 146* 144  K 4.2 4.5 4.3  CL 123* 125* 125*  CO2 18* 16* 19*  GLUCOSE 125* 129* 127*  BUN 32* 18 20  CREATININE 1.15* 1.00 1.12*  CALCIUM 7.4* 7.1* 7.2*  GFRNONAA 45* 54* 47*  GFRAA 53* >60 54*  ANIONGAP 3* 5 0*     Hematology Recent Labs  Lab 10/18/18 0641 10/19/18 0632 10/20/18 0517  WBC 14.1* 16.3* 16.2*  RBC 4.19 4.21 3.79*  HGB 10.9* 11.0* 9.7*  HCT 36.4 37.8 32.6*  MCV 86.9 89.8 86.0  MCH 26.0 26.1 25.6*  MCHC 29.9* 29.1* 29.8*  RDW 17.4* 17.5* 18.0*  PLT 238 256 268    Cardiac EnzymesNo results for input(s): TROPONINI in the last 168 hours. No results for input(s): TROPIPOC in the last 168 hours.   BNPNo results for input(s): BNP, PROBNP in the last 168 hours.   DDimer No results for input(s): DDIMER in the last 168 hours.   Radiology    No results found.  Cardiac Studies   Echocardiogram in September 2019 showed normal LV systolic function and no significant valvular abnormalities  Patient  Profile     77 y.o. female with history of paroxysmal atrial fibrillation on long-term anticoagulation with warfarin, recurrent vulvar cancer, hypertension and hyperlipidemia who presented with C. difficile colitis and dehydration and developed atrial fibrillation with rapid ventricular response.  Assessment & Plan    1.  Paroxysmal atrial fibrillation: Currently with rapid ventricular response.  Ventricular rate improved with the addition of metoprolol but still not optimal.  Continue metoprolol 25 mg every 6 hours.  She is chronically anticoagulated with warfarin which is currently on hold due to elevated INR.  We should consider switching her to DOAC. I am hesitant to add digoxin given the patient's tendency to develop volume depletion and renal failure.  We might have to accept  suboptimal rate control.  The dose of metoprolol can be changed to 50 mg twice daily before hospital discharge. 2.  Hypotension on mild acute renal failure: Resolved with hydration. 3.  C. difficile colitis: On oral vancomycin. 4.  Recurrent vulvar cancer: Not A candidate for repeat surgery and overall prognosis seems to be poor.     For questions or updates, please contact [place] Please consult [email or web address] for contact info under        Signed, Kathlyn Sacramento, MD  10/20/2018, 12:30 PM

## 2018-10-20 NOTE — Evaluation (Signed)
Physical Therapy Evaluation Patient Details Name: Virginia Murray MRN: [id number] DOB: [birth year] Today's Date: 10/20/2018   History of Present Illness  Pt is a 77 y.o. female presenting to hospital 10/11/18 with continued diarrhea and vaginal bleeding.  Pt admitted with c-diff and hypotension.  PMH of known vulvar CA, a-fib, depression, htn, and large hiatal hernia.  Clinical Impression  Prior to hospital admission, pt was ambulatory (no assistive device).  Pt lives with her son on main level of home with a couple steps to enter.  Currently pt is SBA semi-supine to sit; min assist progressing to CGA with transfers; and CGA ambulating in room 30 feet with RW.  Pt would benefit from skilled PT to address noted impairments and functional limitations (see below for any additional details).  Upon hospital discharge, recommend pt discharge to home with HHPT and support of family.    Follow Up Recommendations Home health PT;Supervision for mobility/OOB    Equipment Recommendations  Rolling walker with 5" wheels    Recommendations for Other Services       Precautions / Restrictions Precautions Precautions: Fall Precaution Comments: R chest port Restrictions Weight Bearing Restrictions: No      Mobility  Bed Mobility Overal bed mobility: Needs Assistance Bed Mobility: Supine to Sit     Supine to sit: Supervision;HOB elevated     General bed mobility comments: mild increased effort and time to perform on own  Transfers Overall transfer level: Needs assistance Equipment used: Rolling walker (2 wheeled) Transfers: Sit to/from [facility] Sit to Stand: Min assist;Min guard Stand pivot transfers: Min guard       General transfer comment: min assist to stand from bed first trial; CGA to stand from bed 2nd trial (with increased effort); CGA to stand from recliner  Ambulation/Gait Ambulation/Gait assistance: Min guard Gait Distance (Feet): 30 Feet Assistive  device: Rolling walker (2 wheeled)   Gait velocity: decreased   General Gait Details: decreased B step length/foot clearance/heelstrike; steady with RW use; mild SOB with distance ambulated  Stairs            Wheelchair Mobility    Modified Rankin (Stroke Patients Only)       Balance Overall balance assessment: Needs assistance Sitting-balance support: No upper extremity supported;Feet supported Sitting balance-Leahy Scale: Good Sitting balance - Comments: steady sitting reaching within BOS   Standing balance support: No upper extremity supported Standing balance-Leahy Scale: Good Standing balance comment: steady standing pulling up underwear                             Pertinent Vitals/Pain Pain Assessment: 0-10 Pain Score: 4  Pain Location: "bottom" Pain Descriptors / Indicators: Sore Pain Intervention(s): Limited activity within patient's tolerance;Monitored during session;Repositioned     Home Living Family/patient expects to be discharged to:: Private residence Living Arrangements: Children(Pt's son) Available Help at Discharge: Family Type of Home: House Home Access: Stairs to enter Entrance Stairs-Rails: Right Entrance Stairs-Number of Steps: 2 Home Layout: Able to live on main level with bedroom/bathroom        Prior Function Level of Independence: Independent         Comments: Pt reports no recent falls.     Hand Dominance        Extremity/Trunk Assessment   Upper Extremity Assessment Upper Extremity Assessment: Generalized weakness    Lower Extremity Assessment Lower Extremity Assessment: Generalized weakness    Cervical / Trunk Assessment Cervical /  Trunk Assessment: Normal  Communication   Communication: No difficulties  Cognition Arousal/Alertness: Awake/alert Behavior During Therapy: WFL for tasks assessed/performed Overall Cognitive Status: Within Functional Limits for tasks assessed                                         General Comments   Nursing cleared pt for participation in physical therapy.  Pt agreeable to PT session.    Exercises  Transfer/mobility training   Assessment/Plan    PT Assessment Patient needs continued PT services  PT Problem List Decreased strength;Decreased activity tolerance;Decreased balance;Decreased mobility;Decreased knowledge of use of DME;Decreased knowledge of precautions;Pain;Decreased skin integrity       PT Treatment Interventions DME instruction;Gait training;Stair training;Functional mobility training;Therapeutic activities;Therapeutic exercise;Balance training;Patient/family education    PT Goals (Current goals can be found in the Care Plan section)  Acute Rehab PT Goals Patient Stated Goal: to improve strength and mobility PT Goal Formulation: With patient Time For Goal Achievement: 11/03/18 Potential to Achieve Goals: Good    Frequency Min 2X/week   Barriers to discharge        Co-evaluation               AM-PAC PT "6 Clicks" Daily Activity  Outcome Measure Difficulty turning over in bed (including adjusting bedclothes, sheets and blankets)?: A Little Difficulty moving from lying on back to sitting on the side of the bed? : A Lot Difficulty sitting down on and standing up from a chair with arms (e.g., wheelchair, bedside commode, etc,.)?: Unable Help needed moving to and from a bed to chair (including a wheelchair)?: A Little Help needed walking in hospital room?: A Little Help needed climbing 3-5 steps with a railing? : A Little 6 Click Score: 15    End of Session Equipment Utilized During Treatment: Gait belt Activity Tolerance: Patient tolerated treatment well Patient left: in chair;with call bell/phone within reach;with chair alarm set Nurse Communication: Mobility status;Precautions PT Visit Diagnosis: Other abnormalities of gait and mobility (R26.89);Muscle weakness (generalized) (M62.81);Difficulty in  walking, not elsewhere classified (R26.2)    Time: 1020-1100 PT Time Calculation (min) (ACUTE ONLY): 40 min   Charges:   PT Evaluation $PT Eval Low Complexity: 1 Low PT Treatments $Therapeutic Activity: 23-37 mins       Leitha Bleak, PT 10/20/18, 2:14 PM [phone]

## 2018-10-20 NOTE — Progress Notes (Signed)
Left message with Dr Bridgett Larsson to call patients daughter as she wanted an update from MD on patients condition

## 2018-10-20 NOTE — Progress Notes (Signed)
[place]  [phone] [phone]   Name: Virginia Murray Date: 10/20/2018 MRN: [id number]  DOB: [birth year]  Patient Care Team: Pleas Koch, NP as PCP - General (Internal Medicine) Clent Jacks, RN as Registered Nurse Earlie Server, MD as Medical Oncologist (Medical Oncology) Gillis Ends, MD as Consulting Physician (Gynecologic Oncology)    REASON FOR CONSULTATION: Palliative Care consult requested for this 77 y.o. 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  Patient is being treated on carboplatin with Taxol.  PMH also notable for paroxysmal A. fib anticoagulated on warfarin, hypertension, hyperlipidemia, and depression.  She was hospitalized 09/13/2018 to 09/17/2018 with sepsis found to have blood cultures positive for group G Streptococcus.    Patient was again admitted on 10/11/2018 with diarrhea from Clostridium difficile colitis.  She was also found to be hypotensive.  Palliative care was asked to help address goals of care.  SOCIAL HISTORY:    Patient is widowed, having lost her husband May 2019.  She moved here from [place] to live with her son. She also has a daughter who lives in [place].  ADVANCE DIRECTIVES:  None  CODE STATUS: DNR  PAST MEDICAL HISTORY: Past Medical History:  Diagnosis Date  . A-fib ([place])   . Cancer ([facility])    vulva   . Depression   . Diverticulitis   . Hyperlipemia   . Hypertension   . Vulva cancer ([place])     PAST SURGICAL HISTORY:  Past Surgical History:  Procedure Laterality Date  . ABDOMINAL HYSTERECTOMY    . CATARACT EXTRACTION, BILATERAL    . KNEE SURGERY     right knee   . PORTA CATH INSERTION N/A 09/04/2018   Procedure: PORTA CATH INSERTION;  Surgeon: Algernon Huxley, MD;  Location: [facility];  Service:  Cardiovascular;  Laterality: N/A;  . TEE WITHOUT CARDIOVERSION N/A 09/16/2018   Procedure: TRANSESOPHAGEAL ECHOCARDIOGRAM (TEE);  Surgeon: Nelva Bush, MD;  Location: [facility];  Service: Cardiovascular;  Laterality: N/A;  . TONSILLECTOMY    . VULVA SURGERY      HEMATOLOGY/ONCOLOGY HISTORY:    Vulvar cancer ([place])   09/03/2018 Initial Diagnosis    Vulvar cancer ([place])    09/03/2018 -  Chemotherapy    The patient had palonosetron (ALOXI) injection 0.25 mg, 0.25 mg, Intravenous,  Once, 1 of 5 cycles Administration: 0.25 mg (10/03/2018) CARBOplatin (PARAPLATIN) 250 mg in sodium chloride 0.9 % 100 mL chemo infusion, 250 mg (100 % of original dose 254 mg), Intravenous,  Once, 1 of 5 cycles Dose modification:   (original dose 254 mg, Cycle 1, Reason: Provider Judgment),   (original dose 254 mg, Cycle 1) Administration: 250 mg (10/03/2018) PACLitaxel (TAXOL) 198 mg in sodium chloride 0.9 % 250 mL chemo infusion (> 80mg /m2), 135 mg/m2 = 198 mg (100 % of original dose 135 mg/m2), Intravenous,  Once, 1 of 5 cycles Dose modification: 135 mg/m2 (original dose 135 mg/m2, Cycle 1, Reason: Other (see comments), Comment: performance status) Administration: 198 mg (10/03/2018)  for chemotherapy treatment.      ALLERGIES:  has No Known Allergies.  MEDICATIONS:  Current Facility-Administered Medications  Medication Dose Route Frequency Provider Last Rate Last Dose  . acetaminophen (TYLENOL) tablet 650 mg  650 mg Oral Q6H PRN Sela Hua, MD   650 mg at 10/17/18 2130  Or  . acetaminophen (TYLENOL) suppository 650 mg  650 mg Rectal Q6H PRN Mayo, Pete Pelt, MD      . ALPRAZolam Duanne Moron) tablet 0.25 mg  0.25 mg Oral BID PRN Saundra Shelling, MD   0.25 mg at 10/18/18 2358  . feeding supplement (ENSURE ENLIVE) (ENSURE ENLIVE) liquid 237 mL  237 mL Oral TID BM Epifanio Lesches, MD   237 mL at 10/18/18 2000  . fentaNYL (DURAGESIC - dosed mcg/hr) patch 25 mcg  25 mcg Transdermal Q72H Sela Hua, MD   25  mcg at 10/19/18 0949  . gabapentin (NEURONTIN) capsule 300 mg  300 mg Oral QHS Mayo, Pete Pelt, MD   300 mg at 10/19/18 2019  . Gerhardt's butt cream   Topical TID Epifanio Lesches, MD      . HYDROmorphone (DILAUDID) injection 0.5 mg  0.5 mg Intravenous Q4H PRN Mayo, Pete Pelt, MD   0.5 mg at 10/15/18 0843  . metoprolol tartrate (LOPRESSOR) tablet 25 mg  25 mg Oral Q6H Wellington Hampshire, MD   25 mg at 10/19/18 2329  . midodrine (PROAMATINE) tablet 5 mg  5 mg Oral TID WC Demetrios Loll, MD   5 mg at 10/20/18 0836  . mirtazapine (REMERON) tablet 7.5 mg  7.5 mg Oral QHS Earlie Server, MD   7.5 mg at 10/19/18 2019  . multivitamin with minerals tablet 1 tablet  1 tablet Oral Daily Epifanio Lesches, MD   1 tablet at 10/20/18 0836  . ondansetron (ZOFRAN) tablet 4 mg  4 mg Oral Q6H PRN Mayo, Pete Pelt, MD       Or  . ondansetron [facility]) injection 4 mg  4 mg Intravenous Q6H PRN Mayo, Pete Pelt, MD      . oxyCODONE (Oxy IR/ROXICODONE) immediate release tablet 5 mg  5 mg Oral Q4H PRN Mayo, Pete Pelt, MD   5 mg at 10/19/18 1354  . pravastatin (PRAVACHOL) tablet 40 mg  40 mg Oral Daily Mayo, Pete Pelt, MD   40 mg at 10/20/18 0836  . prochlorperazine (COMPAZINE) tablet 10 mg  10 mg Oral Q6H PRN Mayo, Pete Pelt, MD   10 mg at 10/18/18 0944  . vancomycin (VANCOCIN) 50 mg/mL oral solution 125 mg  125 mg Oral QID Sela Hua, MD   125 mg at 10/20/18 0836  . vitamin B-12 (CYANOCOBALAMIN) tablet 1,000 mcg  1,000 mcg Oral Daily Mayo, Pete Pelt, MD   1,000 mcg at 10/20/18 0836  . warfarin (COUMADIN) tablet 3 mg  3 mg Oral q1800 Lu Duffel, [facility]      . Warfarin - Pharmacist Dosing Inpatient   Does not apply q1800 Lu Duffel, [facility]        VITAL SIGNS: BP 96/60 (BP Location: Left Arm)   Pulse 72   Temp 98.2 F (36.8 C) (Oral)   Resp 18   SpO2 100%  There were no vitals filed for this visit.  Estimated body mass index is 19.49 kg/m as calculated from the following:   Height as of 09/29/18: 5'  3" (1.6 m).   Weight as of 10/10/18: 110 lb (49.9 kg).  LABS: CBC:    Component Value Date/Time   WBC 16.2 (H) 10/20/2018 0517   HGB 9.7 (L) 10/20/2018 0517   HCT 32.6 (L) 10/20/2018 0517   PLT 268 10/20/2018 0517   MCV 86.0 10/20/2018 0517   NEUTROABS 7.0 10/10/2018 1217   LYMPHSABS 0.5 (L) 10/10/2018 1217   MONOABS 0.5 10/10/2018 1217  EOSABS 0.0 10/10/2018 1217   BASOSABS 0.0 10/10/2018 1217   Comprehensive Metabolic Panel:    Component Value Date/Time   NA 144 10/20/2018 0517   K 4.3 10/20/2018 0517   CL 125 (H) 10/20/2018 0517   CO2 19 (L) 10/20/2018 0517   BUN 20 10/20/2018 0517   CREATININE 1.12 (H) 10/20/2018 0517   GLUCOSE 127 (H) 10/20/2018 0517   CALCIUM 7.2 (L) 10/20/2018 0517   AST 16 10/11/2018 1413   ALT 11 10/11/2018 1413   ALKPHOS 54 10/11/2018 1413   BILITOT 0.5 10/11/2018 1413   PROT 5.1 (L) 10/11/2018 1413   ALBUMIN 2.2 (L) 10/11/2018 1413    RADIOGRAPHIC STUDIES: Ct Head Wo Contrast  Result Date: 09/29/2018 CLINICAL DATA:  Syncopal episode, dizziness, weakness, fall and head trauma EXAM: CT HEAD WITHOUT CONTRAST TECHNIQUE: Contiguous axial images were obtained from the base of the skull through the vertex without intravenous contrast. COMPARISON:  09/15/2018 FINDINGS: Brain: Stable atrophy pattern and chronic white matter microvascular ischemic changes throughout both cerebral hemispheres. No acute intracranial hemorrhage, new infarction, mass lesion, midline shift, herniation, hydrocephalus, or extra-axial fluid collection. No focal mass effect or edema. Cisterns are patent. Cerebellar atrophy as well. Vascular: Intracranial atherosclerosis.  No hyperdense vessel. Skull: Normal. Negative for fracture or focal lesion. Sinuses/Orbits: No acute orbital finding. Minor right ethmoid mucosal thickening. Other sinuses visualized are clear. Mastoids are clear. Other: None. IMPRESSION: Stable atrophy pattern and chronic white matter microvascular ischemic  changes. No acute intracranial abnormality by noncontrast CT. Electronically Signed   By: Jerilynn Mages.  Shick M.D.   On: 09/29/2018 21:08   Ct Abdomen Pelvis W Contrast  Result Date: 10/13/2018 CLINICAL DATA:  Locally advanced vulvar cancer. Now with diarrhea and diffuse abdominal discomfort. EXAM: CT ABDOMEN AND PELVIS WITH CONTRAST TECHNIQUE: Multidetector CT imaging of the abdomen and pelvis was performed using the standard protocol following bolus administration of intravenous contrast. CONTRAST:  59mL OMNIPAQUE IOHEXOL 300 MG/ML  SOLN COMPARISON:  None. FINDINGS: Lower chest: Large hiatal hernia. Small bilateral pleural effusions. 2-3 mm pulmonary nodule noted at the right base (1/4). Hepatobiliary: No focal abnormality within the liver parenchyma. There is no evidence for gallstones, gallbladder wall thickening, or pericholecystic fluid. No intrahepatic or extrahepatic biliary dilation. Pancreas: No focal mass lesion. No dilatation of the main duct. No intraparenchymal cyst. No peripancreatic edema. Spleen: No splenomegaly. Tiny low-density subcapsular focus posterior spleen is likely a cyst or pseudocyst. Adrenals/Urinary Tract: No adrenal nodule or mass. Tiny hypoattenuating lesions in each kidney are too small to characterize. No evidence for hydroureter. Mild circumferential bladder wall thickening evident despite underdistention. Stomach/Bowel: Large hiatal hernia with nearly the entire stomach contained in the chest. Duodenum is normally positioned as is the ligament of Treitz. No small bowel wall thickening. No small bowel dilatation. Terminal ileum unremarkable. The appendix is not visualized, but there is no edema or inflammation in the region of the cecum. Right colon is stool-filled. Left colon shows irregular circumferential wall thickening, advanced in the sigmoid colon. There is associated pericolonic edema/inflammation. Prominent stool volume noted in the rectum. Vascular/Lymphatic: There is  abdominal aortic atherosclerosis without aneurysm. There is no gastrohepatic or hepatoduodenal ligament lymphadenopathy. No intraperitoneal or retroperitoneal lymphadenopathy. No pelvic sidewall lymphadenopathy. Reproductive: Uterus surgically absent.  There is no adnexal mass. Other: Small volume intraperitoneal free fluid. Musculoskeletal: No worrisome lytic or sclerotic osseous abnormality. Diffuse body wall edema evident. IMPRESSION: 1. Marked edema and irregularity of the colonic wall, most prominently in the left colon.  This is associated with substantial pericolonic edema/inflammation. Imaging features are compatible with the reported history of C diff colitis. 2. Small volume intraperitoneal free fluid with diffuse body wall edema. 3. Very large hiatal hernia. 4. 2-3 mm pulmonary nodule at the right lung base. No follow-up needed if patient is low-risk. Non-contrast chest CT can be considered in 12 months if patient is high-risk. This recommendation follows the consensus statement: Guidelines for Management of Incidental Pulmonary Nodules Detected on CT Images: From the Fleischner Society 2017; Radiology 2017; 284:228-243. 5.  Aortic Atherosclerois (ICD10-170.0) Electronically Signed   By: Misty Stanley M.D.   On: 10/13/2018 18:11    PERFORMANCE STATUS (ECOG) : 2 - Symptomatic, <50% confined to bed  Review of Systems As noted above. Otherwise, a complete review of systems is negative.  Physical Exam General: frail appearing, thin Cardiovascular: regular rate and rhythm Pulmonary: clear ant fields, unlabored Abdomen: soft, nontender, + bowel sounds Extremities: no edema, no joint deformities Skin: wound noted but not visualized Neurological: Weakness but otherwise nonfocal  IMPRESSION: Weekend notes reviewed.  Oral intake remains poor.  A. fib noted.  Case discussed with Dr. Bridgett Larsson and note goals of care conversation yesterday with family.  Patient is agreeable with me speaking with family.   Tried calling patient's daughter without success.  PLAN: Continue supportive care We will try to arrange family meeting to further conversations about goals  Time Total: 15 minutes  Visit consisted of counseling and education dealing with the complex and emotionally intense issues of symptom management and palliative care in the setting of serious and potentially life-threatening illness.Greater than 50%  of this time was spent counseling and coordinating care related to the above assessment and plan.  Signed by: Altha Harm, [place], NP-C, [place] (Work Cell)

## 2018-10-20 NOTE — Consult Note (Signed)
[place] for warfarin dosing Indication: atrial fibrillation  No Known Allergies  Labs: Recent Labs    10/18/18 0641 10/19/18 0632 10/20/18 0517  HGB 10.9* 11.0*  --   HCT 36.4 37.8  --   PLT 238 256  --   LABPROT 32.5* 32.7* 29.4*  INR 3.23 3.25 2.84  CREATININE  --  1.00 1.12*    Estimated Creatinine Clearance: 34.2 mL/min (A) (by C-G formula based on SCr of 1.12 mg/dL (H)).   Assessment: 77 year-old female female on warfarin 3mg  daily PTA  On a recent admission here in September she exhibited the following response:  DATE   INR      Dose   9/14     1.84     3 mg  9/15     2.73     hold  9/16     2.36     2mg  9/17     2.67     2mg    10/12   5.97     Held 10/13   7.01     Held 10/14   5.3       Held 10/15   8.94     Held - provider ordered IV VitK 1mg  once. 10/16   3.53     Held 10/17   3.42     Held 10/18   3.63     Held 10/19   3.23     Held 10/20   3.25     Held 10/21   2.84     Restart @ 3mg    Goal of Therapy:  INR 2-3 Monitor platelets by anticoagulation protocol: Yes   Plan:  INR therapeutic today.  Restarting warfarin at 3mg  dose.  Follow up daily INR and pharmacy will continue to monitior.   Pharmacy will continue to follow and adjust as per consult.   Lu Duffel, PharmD Clinical Pharmacist 10/20/2018 7:33 AM

## 2018-10-21 ENCOUNTER — Ambulatory Visit: Payer: Medicare Other | Admitting: Cardiovascular Disease

## 2018-10-21 DIAGNOSIS — A0472 Enterocolitis due to Clostridium difficile, not specified as recurrent: Principal | ICD-10-CM

## 2018-10-21 DIAGNOSIS — I4819 Other persistent atrial fibrillation: Secondary | ICD-10-CM

## 2018-10-21 LAB — BASIC METABOLIC PANEL
ANION GAP: 0 — AB (ref 5–15)
BUN: 20 mg/dL (ref 8–23)
CALCIUM: 7.2 mg/dL — AB (ref 8.9–10.3)
CO2: 19 mmol/L — ABNORMAL LOW (ref 22–32)
Chloride: 125 mmol/L — ABNORMAL HIGH (ref 98–111)
Creatinine, Ser: 1.12 mg/dL — ABNORMAL HIGH (ref 0.44–1.00)
GFR, EST AFRICAN AMERICAN: 54 mL/min — AB (ref >60)
GFR, EST NON AFRICAN AMERICAN: 47 mL/min — AB (ref >60)
Glucose, Bld: 127 mg/dL — ABNORMAL HIGH (ref 70–99)
Potassium: 4.3 mmol/L (ref 3.5–5.1)
SODIUM: 144 mmol/L (ref 135–145)

## 2018-10-21 LAB — PROTIME-INR
INR: 3.28
PROTHROMBIN TIME: 32.9 s — AB (ref 11.4–15.2)

## 2018-10-21 MED ORDER — SODIUM CHLORIDE 0.9 % IV SOLN
INTRAVENOUS | Status: AC
  Administered 2018-10-21 – 2018-10-22 (×2): via INTRAVENOUS

## 2018-10-21 MED ORDER — SODIUM CHLORIDE 0.9% FLUSH: 10.0000 mL | INTRAVENOUS | Status: DC | PRN

## 2018-10-21 MED ORDER — SERTRALINE HCL 50 MG PO TABS
25.0000 mg | ORAL_TABLET | Freq: Every day | ORAL | Status: DC
  Administered 2018-10-21 – 2018-10-22 (×2): 25 mg via ORAL
  Filled 2018-10-21 (×3): qty 1

## 2018-10-21 MED ORDER — METOPROLOL TARTRATE 25 MG PO TABS
12.5000 mg | ORAL_TABLET | Freq: Four times a day (QID) | ORAL | Status: DC
  Administered 2018-10-21 – 2018-10-23 (×7): 12.5 mg via ORAL
  Filled 2018-10-21 (×7): qty 1

## 2018-10-21 NOTE — Progress Notes (Addendum)
Progress Note  Patient Name: Virginia Murray Date of Encounter: 10/21/2018  Primary Cardiologist: [facility] - Dr. Fletcher Anon  Subjective   Patient is groggy as waking for the day during examination.  No c/o chest pain, palpitations, or rapid heart rate.   Inpatient Medications    Scheduled Meds: . feeding supplement  1 Container Oral TID BM  . fentaNYL  25 mcg Transdermal Q72H  . gabapentin  300 mg Oral QHS  . Gerhardt's butt cream   Topical TID  . metoprolol tartrate  25 mg Oral Q6H  . midodrine  5 mg Oral TID WC  . mirtazapine  7.5 mg Oral QHS  . multivitamin with minerals  1 tablet Oral Daily  . pravastatin  40 mg Oral Daily  . vancomycin  125 mg Oral QID  . cyanocobalamin  1,000 mcg Oral Daily  . warfarin  3 mg Oral q1800  . Warfarin - Pharmacist Dosing Inpatient   Does not apply q1800   Continuous Infusions:  PRN Meds: acetaminophen **OR** acetaminophen, ALPRAZolam, HYDROmorphone (DILAUDID) injection, ondansetron **OR** ondansetron (ZOFRAN) IV, oxyCODONE, prochlorperazine   Vital Signs    Vitals:   10/20/18 1924 10/20/18 2343 10/20/18 2347 10/21/18 0418  BP:  (!) 98/52 (!) 103/57 (!) 90/50  Pulse: 91 (!) 110 (!) 107 94  Resp:    15  Temp:    97.6 F (36.4 C)  TempSrc:    Oral  SpO2: 100%   95%  Weight:    67.5 kg    Intake/Output Summary (Last 24 hours) at 10/21/2018 0751 Last data filed at 10/20/2018 1851 Gross per 24 hour  Intake 120 ml  Output -  Net 120 ml   Filed Weights   10/21/18 0418  Weight: 67.5 kg    Telemetry    IRIR with ventricular rates in the 100s- Personally Reviewed   Physical Exam   GEN: No acute distress. Frail, elderly woman Neck: No JVD Cardiac:  Irregularly irregular, mildly tachycardic. no murmurs, rubs, or gallops.  Respiratory: Clear to auscultation bilaterally. GI: Soft, nontender, non-distended  MS: No edema; No deformity. Neuro:  Nonfocal  Psych: Groggy, having just woke for the day  Labs     Chemistry Recent Labs  Lab 10/16/18 0332 10/19/18 0632 10/20/18 0517  NA 144 146* 144  K 4.2 4.5 4.3  CL 123* 125* 125*  CO2 18* 16* 19*  GLUCOSE 125* 129* 127*  BUN 32* 18 20  CREATININE 1.15* 1.00 1.12*  CALCIUM 7.4* 7.1* 7.2*  GFRNONAA 45* 54* 47*  GFRAA 53* >60 54*  ANIONGAP 3* 5 0*     Hematology Recent Labs  Lab 10/18/18 0641 10/19/18 0632 10/20/18 0517  WBC 14.1* 16.3* 16.2*  RBC 4.19 4.21 3.79*  HGB 10.9* 11.0* 9.7*  HCT 36.4 37.8 32.6*  MCV 86.9 89.8 86.0  MCH 26.0 26.1 25.6*  MCHC 29.9* 29.1* 29.8*  RDW 17.4* 17.5* 18.0*  PLT 238 256 268    Cardiac EnzymesNo results for input(s): TROPONINI in the last 168 hours. No results for input(s): TROPIPOC in the last 168 hours.   BNPNo results for input(s): BNP, PROBNP in the last 168 hours.   DDimer No results for input(s): DDIMER in the last 168 hours.   Radiology    No results found.  Cardiac Studies   Echocardiogram in September 2019 showed normal LV systolic function and no significant valvular abnormalities  Patient Profile     77 y.o. female with history of paroxysmal atrial fibrillation on  long-term anticoagulation with warfarin, recurrent vulvar cancer, hypertension and hyperlipidemia who presented with C. difficile colitis and dehydration and developed atrial fibrillation with rapid ventricular response.  Assessment & Plan    1.  Paroxysmal atrial fibrillation with rapid ventricular response:   - CHA2DS2VASc score of at least 4 (HTN, age x2, female). Home warfarin currently on hold due to elevated INR. INR 3.28. Consider switching to DOAC.  - Given the patient's tendency to develop volume depletion and renal failure, would be cautious with decision re: addition of digoxin.  May need to accept suboptimal rate control.  - Ventricular rates remain elevated with metoprolol 25 mg q6h - rate control complicated by low BP 32/12. Consider elevated ventricular rates in the setting of infection and  dehydration. Will decrease metoprolol to 12.5mg  q6 with hold paramaters for SBP <85 d/t hypotension and consider additional IVF to see if improvement in HR with gentle hydration.   2.  Hypotension on mild acute renal failure: - Cr 1.12 yesterday. No Cr. Yet for today. Baseline ~0.82 - BP remains hypotensive.  - Decrease metoprolol as above with hold parameters SBP <85 - Consider continued IVF with rapid ventricular rates as above - Daily BMET. Continue to monitor   3.  C. difficile colitis:  - On oral vancomycin.   4.  Recurrent vulvar cancer:  - Not A candidate for repeat surgery and overall prognosis seems to be poor.     For questions or updates, please contact [place] Please consult [email or web address] for contact info under [facility].       Signed, Arvil Chaco, PA-C  10/21/2018, 7:51 AM     I have seen and examined the patient and agree with the findings and plan, as documented in the PA/NP's note, with the following additions/changes.  Patient reports feeling tired but is otherwise asymptomatic.  No chest pain, shortness of breath, or palpitations.  Exam notable for tachycardic and irregularly irregular rhythm.  Breath sounds diffusely diminished.  There is trace pretibial edema.  Labs notable for creatinine 1.1, WBC 16.2, and hemoglobin 9.7 (down from 11.0 yesterday).  Telemetry shows a-fib with HR 90-110 at rest (spikes up to 145 bpm with mild activity).  Last 2 metoprolol doses have been held due to soft BP.  I recommend reducing metoprolol tartrate to 12.5 mg Q6hours (to be held for symptomatic hypotension or SBP < 85).  Gentle hydration can be considered to support blood pressure.  Hopefully, improved heart rate control will improve blood pressure.  Digoxin could be used if necessary, though I would like to avoid this if possible due to moderate renal insufficiency.  Midodrine should be weaned, as possible, as acute infectious process/sepsis improves.  Warfarin  should be continued for anticoagulation (INR therapeutic).  Nelva Bush, MD [facility] Pager: [phone]

## 2018-10-21 NOTE — Progress Notes (Signed)
Virginia Murray at [place] NAME: Virginia Murray    MR#:  [id number]  DATE OF BIRTH:  [birth year]  Overall in a good more than yesterday, and says still abdominal pain and diarrhea on and off.  Not eating much.  And refusing physical therapy.  CHIEF COMPLAINT:   Chief Complaint  Patient presents with  . Diarrhea  . Vaginal Bleeding   The patient has small diarrhea one episode.  She has better oral intake.  She tolerated PT. REVIEW OF SYSTEMS:   Review of Systems  Constitutional: Negative for chills, fever and malaise/fatigue.  HENT: Negative for hearing loss.   Eyes: Negative for blurred vision, double vision and photophobia.  Respiratory: Negative for cough, hemoptysis and shortness of breath.   Cardiovascular: Negative for palpitations, orthopnea and leg swelling.  Gastrointestinal: Positive for diarrhea. Negative for abdominal pain and vomiting.  Genitourinary: Negative for dysuria and urgency.  Musculoskeletal: Negative for myalgias and neck pain.  Skin: Negative for rash.  Neurological: Negative for dizziness, focal weakness, seizures, weakness and headaches.  Psychiatric/Behavioral: The patient does not have insomnia.    DRUG ALLERGIES:  No Known Allergies  VITALS:  Blood pressure (!) 106/58, pulse 78, temperature 98.6 F (37 C), temperature source Oral, resp. rate 16, weight 67.5 kg, SpO2 100 %.  PHYSICAL EXAMINATION:  GENERAL:  77 y.o.-year-old patient lying in the bed with no acute distress.  Appears cachectic.  Appears pale as well. EYES: Pupils equal, round, reactive to light . No scleral icterus. Extraocular muscles intact.  HEENT: Head atraumatic, normocephalic. Oropharynx and nasopharynx clear.  NECK:  Supple, no jugular venous distention. No thyroid enlargement, no tenderness.  LUNGS: Normal breath sounds bilaterally, no wheezing, rales,rhonchi or crepitation. No use of accessory muscles of respiration.   CARDIOVASCULAR: S1, S2 normal. No murmurs, rubs, or gallops.  ABDOMEN abdomen is soft, nontender, nondistended, bowel sounds present. eXTREMITIES: No pedal edema, cyanosis, or clubbing.  NEUROLOGIC: Cranial nerves II through XII are intact. Muscle strength 5/5 in all extremities. Sensation intact. Gait not checked.  PSYCHIATRIC: The patient is alert and oriented x 3.  SKIN: Noted to have excoriation in the vaginal area and also rectal area.   LABORATORY PANEL:   CBC Recent Labs  Lab 10/20/18 0517  WBC 16.2*  HGB 9.7*  HCT 32.6*  PLT 268   ------------------------------------------------------------------------------------------------------------------  Chemistries  Recent Labs  Lab 10/20/18 0517  NA 144  K 4.3  CL 125*  CO2 19*  GLUCOSE 127*  BUN 20  CREATININE 1.12*  CALCIUM 7.2*  MG 2.1   ------------------------------------------------------------------------------------------------------------------  Cardiac Enzymes No results for input(s): TROPONINI in the last 168 hours. ------------------------------------------------------------------------------------------------------------------  RADIOLOGY:  No results found.  EKG:   Orders placed or performed during the hospital encounter of 09/29/18  . ED EKG  . ED EKG  . EKG    ASSESSMENT AND PLAN:   #1. C. difficile colitis:  Continue p.o. Vancomycin (5 more days), continue enteric precautions, p continue p.o. vancomycin, for 14 days total.  CT abdomen showed Marked edema and irregularity of colonic wall consistent with colitis.  WBC decreased to 14.1 from peak of 22.1, but 16.2 today.  Follow-up CBC.  2.  Chronic hypotension: better BP with IV fluids, PRN boluses for hypotension, continue to hold Norvasc, atenolol, benazepril.  Started Midodrin.  Restart IV NS.  #3 .  Paroxysmal atrial fibrillation RVR with Supratherapeutic INR up to 8.94 , status post vitamin K,. INR 2.84 .  Resume Coumadin. Per Dr. Saunders Revel,  Lopressor is decreased to 12.5 mg every 6 hours, Hold Lopressor if BP is low.  #4 .recent GBS bacteremia, hospitalized from 9/14 , 9/18, patient received Rocephin for 2 weeks via port, repeat blood cultures from 101/1 at oncology office did not show any growth for 24 hours.  5.  Recurrent vulvar cancer, patient is undergoing chemotherapy, history of radical vulvectomy in 2007, follows up with Dr. Tasia Catchings as an outpatient, according to Dr. Collie Siad note patient is not a candidate for further gynecological surgery due to her poor performance status.  Dr. Tasia Catchings discussed with the event oncology Dr. Theora Gianotti.  Patient seen by palliative care team Josh as an outpatient.  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 She is due for chemotherapy next Friday.  Will reassess her condition prior to the chemotherapy if she is discharged per Dr. Tasia Catchings.  Patient lost her husband recently and has been having some hallucinations, No indication to start any psychiatric medicine at this point no evidence of dangerousness per Dr. Weber Cooks.  #6. hyperlipidemia: Continue statins.  #7.  Generalized weakness.  PT evaluation: HHPT.  Hypomagnesemia.  Improved with IV magnesium.  I discussed with palliative care nurse practitioner Josh.  He scheduled family meeting tomorrow.  More than 50% time spent in counseling, coordination of care. All the records are reviewed and case discussed with Care Management/Social Workerr. Management plans discussed with the patient, daugher-in-law and they are in agreement.  CODE STATUS: DNR  TOTAL TIME TAKING CARE OF THIS PATIENT: 26 minutes.   POSSIBLE D/C IN 1-2 DAYS, DEPENDING ON CLINICAL CONDITION.   Demetrios Loll M.D on 10/21/2018 at 2:22 PM  Between 7am to 6pm - Pager - [phone]  After 6pm go to [email or web address] - password [email or web address] [facility]  [facility]  Office  [phone]  CC: Primary care  physician; Pleas Koch, NP   Note: This dictation was prepared with Dragon dictation along with smaller phrase technology. Any transcriptional errors that result from this process are unintentional.

## 2018-10-21 NOTE — Consult Note (Signed)
[facility] [place] for warfarin dosing Indication: atrial fibrillation  No Known Allergies  Labs: Recent Labs    10/19/18 0632 10/20/18 0517 10/21/18 0515  HGB 11.0* 9.7*  --   HCT 37.8 32.6*  --   PLT 256 268  --   LABPROT 32.7* 29.4* 32.9*  INR 3.25 2.84 3.28  CREATININE 1.00 1.12*  --     Estimated Creatinine Clearance: 40 mL/min (A) (by C-G formula based on SCr of 1.12 mg/dL (H)).   Assessment: 77 year-old female female on warfarin 3mg  daily PTA  On a recent admission here in September she exhibited the following response:  DATE   INR      Dose   9/14     1.84     3 mg  9/15     2.73     hold  9/16     2.36     2mg  9/17     2.67     2mg    10/12   5.97     Held 10/13   7.01     Held 10/14   5.3       Held 10/15   8.94     Held - provider ordered IV VitK 1mg  once. 10/16   3.53     Held 10/17   3.42     Held 10/18   3.63     Held 10/19   3.23     Held 10/20   3.25     Held 10/21   2.84     Restart @ 3mg  10/22   3.28     Hold   Goal of Therapy:  INR 2-3 Monitor platelets by anticoagulation protocol: Yes   Plan:  INR supratherapeutic today.  Holding warfarin.  Follow up daily INR and pharmacy will continue to monitior.   Pharmacy will continue to follow and adjust as per consult.   Lu Duffel, PharmD Clinical Pharmacist 10/21/2018 8:10 AM

## 2018-10-22 ENCOUNTER — Telehealth: Payer: Self-pay | Admitting: Primary Care

## 2018-10-22 DIAGNOSIS — Z7189 Other specified counseling: Secondary | ICD-10-CM

## 2018-10-22 LAB — CBC
HEMATOCRIT: 29.4 % — AB (ref 36.0–46.0)
HEMOGLOBIN: 8.7 g/dL — AB (ref 12.0–15.0)
MCH: 25.9 pg — ABNORMAL LOW (ref 26.0–34.0)
MCHC: 29.6 g/dL — ABNORMAL LOW (ref 30.0–36.0)
MCV: 87.5 fL (ref 80.0–100.0)
Platelets: 220 10*3/uL (ref 150–400)
RBC: 3.36 MIL/uL — ABNORMAL LOW (ref 3.87–5.11)
RDW: 17.7 % — ABNORMAL HIGH (ref 11.5–15.5)
WBC: 15.3 10*3/uL — AB (ref 4.0–10.5)
nRBC: 0 % (ref 0.0–0.2)

## 2018-10-22 LAB — PROTIME-INR
INR: 3.76
Prothrombin Time: 36.6 seconds — ABNORMAL HIGH (ref 11.4–15.2)

## 2018-10-22 MED ORDER — DIGOXIN 125 MCG PO TABS: 0.1250 mg | ORAL_TABLET | Freq: Every day | ORAL | 0 refills | Status: AC

## 2018-10-22 MED ORDER — METOPROLOL TARTRATE 25 MG PO TABS: 12.5000 mg | ORAL_TABLET | Freq: Four times a day (QID) | ORAL | 1 refills | Status: DC

## 2018-10-22 MED ORDER — DIGOXIN 125 MCG PO TABS
0.1250 mg | ORAL_TABLET | Freq: Every day | ORAL | Status: DC
  Administered 2018-10-23: 10:00:00 0.125 mg via ORAL
  Filled 2018-10-22: qty 1

## 2018-10-22 MED ORDER — MIDODRINE HCL 5 MG PO TABS: 5.0000 mg | ORAL_TABLET | Freq: Three times a day (TID) | ORAL | 0 refills | Status: AC

## 2018-10-22 MED ORDER — DIGOXIN 0.25 MG/ML IJ SOLN
0.2500 mg | Freq: Once | INTRAMUSCULAR | Status: AC
  Administered 2018-10-22: 0.25 mg via INTRAVENOUS
  Filled 2018-10-22: qty 2

## 2018-10-22 NOTE — Telephone Encounter (Signed)
Please advise:      Copied from [place] [phone]. Topic: Appointment Scheduling - Scheduling Inquiry for Clinic >> Oct 22, 2018  9:24 AM Sheran Luz wrote: Reason for CRM: Debbie from [facility] calling to schedule pt follow up appointment for tomorrow with Virginia Friendly, NP- No availability. Please advise.  Jackelyn Poling is requesting call back if scheduling is possible.  Cb# [phone]

## 2018-10-22 NOTE — Progress Notes (Signed)
[facility]  [phone] [phone]   Name: Virginia Murray Date: 10/22/2018 MRN: [id number]  DOB: 1941/09/22  Patient Care Team: Pleas Koch, NP as PCP - General (Internal Medicine) Clent Jacks, RN as Registered Nurse Earlie Server, MD as Medical Oncologist (Medical Oncology) Gillis Ends, MD as Consulting Physician (Gynecologic Oncology)    REASON FOR CONSULTATION: Palliative Care consult requested for this 77 y.o. 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  Patient is being treated on carboplatin with Taxol.  PMH also notable for paroxysmal A. fib anticoagulated on warfarin, hypertension, hyperlipidemia, and depression.  She was hospitalized 09/13/2018 to 09/17/2018 with sepsis found to have blood cultures positive for group G Streptococcus.    Patient was again admitted on 10/11/2018 with diarrhea from Clostridium difficile colitis.  She was also found to be hypotensive.  Palliative care was asked to help address goals of care.  SOCIAL HISTORY:    Patient is widowed, having lost her husband May 2019.  She moved here from [place] to live with her son. She also has a daughter who lives in [place].  ADVANCE DIRECTIVES:  None  CODE STATUS: DNR  PAST MEDICAL HISTORY: Past Medical History:  Diagnosis Date  . A-fib ([place])   . Cancer (H[facility])    vulva   . Depression   . Diverticulitis   . Hyperlipemia   . Hypertension   . Vulva cancer ([place])     PAST SURGICAL HISTORY:  Past Surgical History:  Procedure Laterality Date  . ABDOMINAL HYSTERECTOMY    . CATARACT EXTRACTION, BILATERAL    . KNEE SURGERY     right knee   . PORTA CATH INSERTION N/A 09/04/2018   Procedure: PORTA CATH INSERTION;  Surgeon: Algernon Huxley, MD;  Location: [facility];  Service:  Cardiovascular;  Laterality: N/A;  . TEE WITHOUT CARDIOVERSION N/A 09/16/2018   Procedure: TRANSESOPHAGEAL ECHOCARDIOGRAM (TEE);  Surgeon: Nelva Bush, MD;  Location: [facility];  Service: Cardiovascular;  Laterality: N/A;  . TONSILLECTOMY    . VULVA SURGERY      HEMATOLOGY/ONCOLOGY HISTORY:    Vulvar cancer ([place])   09/03/2018 Initial Diagnosis    Vulvar cancer ([place])    09/03/2018 -  Chemotherapy    The patient had palonosetron (ALOXI) injection 0.25 mg, 0.25 mg, Intravenous,  Once, 1 of 5 cycles Administration: 0.25 mg (10/03/2018) CARBOplatin (PARAPLATIN) 250 mg in sodium chloride 0.9 % 100 mL chemo infusion, 250 mg (100 % of original dose 254 mg), Intravenous,  Once, 1 of 5 cycles Dose modification:   (original dose 254 mg, Cycle 1, Reason: Provider Judgment),   (original dose 254 mg, Cycle 1) Administration: 250 mg (10/03/2018) PACLitaxel (TAXOL) 198 mg in sodium chloride 0.9 % 250 mL chemo infusion (> 80mg /m2), 135 mg/m2 = 198 mg (100 % of original dose 135 mg/m2), Intravenous,  Once, 1 of 5 cycles Dose modification: 135 mg/m2 (original dose 135 mg/m2, Cycle 1, Reason: Other (see comments), Comment: performance status) Administration: 198 mg (10/03/2018)  for chemotherapy treatment.      ALLERGIES:  has No Known Allergies.  MEDICATIONS:  Current Facility-Administered Medications  Medication Dose Route Frequency Provider Last Rate Last Dose  . 0.9 %  sodium chloride infusion   Intravenous Continuous Demetrios Loll, MD 75 mL/hr at 10/22/18 0446    . acetaminophen (TYLENOL)  tablet 650 mg  650 mg Oral Q6H PRN Sela Hua, MD   650 mg at 10/17/18 2130   Or  . acetaminophen (TYLENOL) suppository 650 mg  650 mg Rectal Q6H PRN Mayo, Pete Pelt, MD      . ALPRAZolam Duanne Moron) tablet 0.25 mg  0.25 mg Oral BID PRN Saundra Shelling, MD   0.25 mg at 10/18/18 2358  . feeding supplement (BOOST / RESOURCE BREEZE) liquid 1 Container  1 Container Oral TID BM Demetrios Loll, MD   1 Container at 10/21/18 1445   . fentaNYL (DURAGESIC - dosed mcg/hr) patch 25 mcg  25 mcg Transdermal Q72H Sela Hua, MD   25 mcg at 10/22/18 0859  . gabapentin (NEURONTIN) capsule 300 mg  300 mg Oral QHS Mayo, Pete Pelt, MD   300 mg at 10/21/18 2329  . Gerhardt's butt cream   Topical TID Epifanio Lesches, MD      . HYDROmorphone (DILAUDID) injection 0.5 mg  0.5 mg Intravenous Q4H PRN Mayo, Pete Pelt, MD   0.5 mg at 10/15/18 0843  . metoprolol tartrate (LOPRESSOR) tablet 12.5 mg  12.5 mg Oral Q6H Visser, Jacquelyn D, PA-C   12.5 mg at 10/22/18 1148  . midodrine (PROAMATINE) tablet 5 mg  5 mg Oral TID WC Demetrios Loll, MD   5 mg at 10/22/18 1148  . mirtazapine (REMERON) tablet 7.5 mg  7.5 mg Oral QHS Earlie Server, MD   7.5 mg at 10/21/18 2329  . multivitamin with minerals tablet 1 tablet  1 tablet Oral Daily Epifanio Lesches, MD   1 tablet at 10/22/18 0856  . ondansetron (ZOFRAN) tablet 4 mg  4 mg Oral Q6H PRN Mayo, Pete Pelt, MD       Or  . ondansetron [facility]) injection 4 mg  4 mg Intravenous Q6H PRN Mayo, Pete Pelt, MD      . oxyCODONE (Oxy IR/ROXICODONE) immediate release tablet 5 mg  5 mg Oral Q4H PRN Mayo, Pete Pelt, MD   5 mg at 10/19/18 1354  . pravastatin (PRAVACHOL) tablet 40 mg  40 mg Oral Daily Mayo, Pete Pelt, MD   40 mg at 10/22/18 0857  . prochlorperazine (COMPAZINE) tablet 10 mg  10 mg Oral Q6H PRN Mayo, Pete Pelt, MD   10 mg at 10/18/18 0944  . sertraline (ZOLOFT) tablet 25 mg  25 mg Oral Daily Demetrios Loll, MD   25 mg at 10/22/18 0857  . sodium chloride flush (NS) 0.9 % injection 10-40 mL  10-40 mL Intracatheter PRN Demetrios Loll, MD      . vitamin B-12 (CYANOCOBALAMIN) tablet 1,000 mcg  1,000 mcg Oral Daily Mayo, Pete Pelt, MD   1,000 mcg at 10/22/18 0857  . Warfarin - Pharmacist Dosing Inpatient   Does not apply q1800 Lu Duffel, [facility]        VITAL SIGNS: BP 103/60 (BP Location: Left Arm)   Pulse (!) 107   Temp 97.9 F (36.6 C) (Oral)   Resp 14   Wt 148 lb 14.4 oz (67.5 kg)   SpO2 96%    BMI 26.38 kg/m  Filed Weights   10/21/18 0418  Weight: 148 lb 14.4 oz (67.5 kg)    Estimated body mass index is 26.38 kg/m as calculated from the following:   Height as of 09/29/18: 5\' 3"  (1.6 m).   Weight as of this encounter: 148 lb 14.4 oz (67.5 kg).  LABS: CBC:    Component Value Date/Time   WBC 15.3 (H) 10/22/2018  0433   HGB 8.7 (L) 10/22/2018 0433   HCT 29.4 (L) 10/22/2018 0433   PLT 220 10/22/2018 0433   MCV 87.5 10/22/2018 0433   NEUTROABS 7.0 10/10/2018 1217   LYMPHSABS 0.5 (L) 10/10/2018 1217   MONOABS 0.5 10/10/2018 1217   EOSABS 0.0 10/10/2018 1217   BASOSABS 0.0 10/10/2018 1217   Comprehensive Metabolic Panel:    Component Value Date/Time   NA 144 10/20/2018 0517   K 4.3 10/20/2018 0517   CL 125 (H) 10/20/2018 0517   CO2 19 (L) 10/20/2018 0517   BUN 20 10/20/2018 0517   CREATININE 1.12 (H) 10/20/2018 0517   GLUCOSE 127 (H) 10/20/2018 0517   CALCIUM 7.2 (L) 10/20/2018 0517   AST 16 10/11/2018 1413   ALT 11 10/11/2018 1413   ALKPHOS 54 10/11/2018 1413   BILITOT 0.5 10/11/2018 1413   PROT 5.1 (L) 10/11/2018 1413   ALBUMIN 2.2 (L) 10/11/2018 1413    RADIOGRAPHIC STUDIES: Ct Head Wo Contrast  Result Date: 09/29/2018 CLINICAL DATA:  Syncopal episode, dizziness, weakness, fall and head trauma EXAM: CT HEAD WITHOUT CONTRAST TECHNIQUE: Contiguous axial images were obtained from the base of the skull through the vertex without intravenous contrast. COMPARISON:  09/15/2018 FINDINGS: Brain: Stable atrophy pattern and chronic white matter microvascular ischemic changes throughout both cerebral hemispheres. No acute intracranial hemorrhage, new infarction, mass lesion, midline shift, herniation, hydrocephalus, or extra-axial fluid collection. No focal mass effect or edema. Cisterns are patent. Cerebellar atrophy as well. Vascular: Intracranial atherosclerosis.  No hyperdense vessel. Skull: Normal. Negative for fracture or focal lesion. Sinuses/Orbits: No acute orbital  finding. Minor right ethmoid mucosal thickening. Other sinuses visualized are clear. Mastoids are clear. Other: None. IMPRESSION: Stable atrophy pattern and chronic white matter microvascular ischemic changes. No acute intracranial abnormality by noncontrast CT. Electronically Signed   By: Jerilynn Mages.  Shick M.D.   On: 09/29/2018 21:08   Ct Abdomen Pelvis W Contrast  Result Date: 10/13/2018 CLINICAL DATA:  Locally advanced vulvar cancer. Now with diarrhea and diffuse abdominal discomfort. EXAM: CT ABDOMEN AND PELVIS WITH CONTRAST TECHNIQUE: Multidetector CT imaging of the abdomen and pelvis was performed using the standard protocol following bolus administration of intravenous contrast. CONTRAST:  71mL OMNIPAQUE IOHEXOL 300 MG/ML  SOLN COMPARISON:  None. FINDINGS: Lower chest: Large hiatal hernia. Small bilateral pleural effusions. 2-3 mm pulmonary nodule noted at the right base (1/4). Hepatobiliary: No focal abnormality within the liver parenchyma. There is no evidence for gallstones, gallbladder wall thickening, or pericholecystic fluid. No intrahepatic or extrahepatic biliary dilation. Pancreas: No focal mass lesion. No dilatation of the main duct. No intraparenchymal cyst. No peripancreatic edema. Spleen: No splenomegaly. Tiny low-density subcapsular focus posterior spleen is likely a cyst or pseudocyst. Adrenals/Urinary Tract: No adrenal nodule or mass. Tiny hypoattenuating lesions in each kidney are too small to characterize. No evidence for hydroureter. Mild circumferential bladder wall thickening evident despite underdistention. Stomach/Bowel: Large hiatal hernia with nearly the entire stomach contained in the chest. Duodenum is normally positioned as is the ligament of Treitz. No small bowel wall thickening. No small bowel dilatation. Terminal ileum unremarkable. The appendix is not visualized, but there is no edema or inflammation in the region of the cecum. Right colon is stool-filled. Left colon shows  irregular circumferential wall thickening, advanced in the sigmoid colon. There is associated pericolonic edema/inflammation. Prominent stool volume noted in the rectum. Vascular/Lymphatic: There is abdominal aortic atherosclerosis without aneurysm. There is no gastrohepatic or hepatoduodenal ligament lymphadenopathy. No intraperitoneal or retroperitoneal lymphadenopathy. No  pelvic sidewall lymphadenopathy. Reproductive: Uterus surgically absent.  There is no adnexal mass. Other: Small volume intraperitoneal free fluid. Musculoskeletal: No worrisome lytic or sclerotic osseous abnormality. Diffuse body wall edema evident. IMPRESSION: 1. Marked edema and irregularity of the colonic wall, most prominently in the left colon. This is associated with substantial pericolonic edema/inflammation. Imaging features are compatible with the reported history of C diff colitis. 2. Small volume intraperitoneal free fluid with diffuse body wall edema. 3. Very large hiatal hernia. 4. 2-3 mm pulmonary nodule at the right lung base. No follow-up needed if patient is low-risk. Non-contrast chest CT can be considered in 12 months if patient is high-risk. This recommendation follows the consensus statement: Guidelines for Management of Incidental Pulmonary Nodules Detected on CT Images: From the Fleischner Society 2017; Radiology 2017; 284:228-243. 5.  Aortic Atherosclerois (ICD10-170.0) Electronically Signed   By: Misty Stanley M.D.   On: 10/13/2018 18:11    PERFORMANCE STATUS (ECOG) : 2 - Symptomatic, <50% confined to bed  Review of Systems As noted above. Otherwise, a complete review of systems is negative.  Physical Exam General: frail appearing, thin Cardiovascular: regular rate and rhythm Pulmonary: clear ant fields, unlabored Abdomen: soft, nontender, + bowel sounds Extremities: no edema, no joint deformities Skin: wound noted but not visualized Neurological: Weakness but otherwise nonfocal  IMPRESSION: Patient  remains frail and weak.  Oral intake is poor.  Patient is only eating sips and bites most meals.  Consistently eating less than 25%.  Diarrhea has improved.  Note downtrending hemoglobin.  Family meeting planned this afternoon with son and daughter-in-law.  Patient continues to endorse a desire to go home and continue treatment.  PLAN: Continue supportive care Family meeting scheduled  Time Total: 30 minutes  Visit consisted of counseling and education dealing with the complex and emotionally intense issues of symptom management and palliative care in the setting of serious and potentially life-threatening illness.Greater than 50%  of this time was spent counseling and coordinating care related to the above assessment and plan.  Signed by: Altha Harm, [place], NP-C, [place] (Work Cell)

## 2018-10-22 NOTE — Discharge Instructions (Signed)
HHPT Fall precaution. Hold coumadin, follow up INR with PCP tomorrow. May resume coumadin at 1.5 mg HS if INR is therapeutic.

## 2018-10-22 NOTE — Progress Notes (Signed)
Hematology/Oncology Progress Note [facility] [phone] [phone]  Patient Care Team: Pleas Koch, NP as PCP - General (Internal Medicine) Clent Jacks, RN as Registered Nurse Earlie Server, MD as Medical Oncologist (Medical Oncology) Gillis Ends, MD as Consulting Physician (Gynecologic Oncology)   Name of the patient: Virginia Murray  [id number]  [birth year]  Date of visit: 77   INTERVAL HISTORY-  No acute overnight events.  Lying in bed.  No acute distress. Reports occasionally having episode of loose stools.  Oral intake is better. She tolerated physical therapy evaluation. Feels tired. Pain is controlled well. Review of systems- Review of Systems  Constitutional: Positive for malaise/fatigue. Negative for fever and weight loss.  HENT: Negative for nosebleeds and sore throat.   Eyes: Negative for double vision, photophobia and redness.  Respiratory: Negative for cough, shortness of breath and wheezing.   Cardiovascular: Negative for chest pain, palpitations and orthopnea.  Gastrointestinal: Positive for diarrhea. Negative for abdominal pain, blood in stool, nausea and vomiting.  Genitourinary: Negative for dysuria.  Musculoskeletal: Negative for back pain, myalgias and neck pain.  Skin: Negative for itching and rash.  Neurological: Negative for dizziness, tingling and tremors.  Endo/Heme/Allergies: Negative for environmental allergies. Does not bruise/bleed easily.  Psychiatric/Behavioral: Negative for depression. The patient is not nervous/anxious.     No Known Allergies  Patient Active Problem List   Diagnosis Date Noted  . Grief 10/17/2018  . Palliative care encounter   . Hypotension due to hypovolemia   . Depression   . Elevated INR   . Hypomagnesemia 10/13/2018  . Generalized abdominal pain   . C. difficile colitis 10/11/2018  . Diarrhea 10/10/2018  . Nocturnal leg cramps 10/07/2018  .  Atrial fibrillation ([place]) 09/26/2018  . Long term (current) use of anticoagulants 09/26/2018  . Hyperlipidemia 09/26/2018  . Constipation due to opioid therapy 09/26/2018  . Generalized anxiety disorder   . Agitation   . Vulvar cancer ([place]) 09/03/2018  . Goals of care, counseling/discussion 09/03/2018     Past Medical History:  Diagnosis Date  . A-fib ([place])   . Cancer (H[facility])    vulva   . Depression   . Diverticulitis   . Hyperlipemia   . Hypertension   . Vulva cancer [facility])      Past Surgical History:  Procedure Laterality Date  . ABDOMINAL HYSTERECTOMY    . CATARACT EXTRACTION, BILATERAL    . KNEE SURGERY     right knee   . PORTA CATH INSERTION N/A 09/04/2018   Procedure: PORTA CATH INSERTION;  Surgeon: Algernon Huxley, MD;  Location: [facility];  Service: Cardiovascular;  Laterality: N/A;  . TEE WITHOUT CARDIOVERSION N/A 09/16/2018   Procedure: TRANSESOPHAGEAL ECHOCARDIOGRAM (TEE);  Surgeon: Nelva Bush, MD;  Location: [facility] ORS;  Service: Cardiovascular;  Laterality: N/A;  . TONSILLECTOMY    . VULVA SURGERY      Social History   Socioeconomic History  . Marital status: Widowed    Spouse name: Not on file  . Number of children: 2  . Years of education: Not on file  . Highest education level: Not on file  Occupational History  . Not on file  Social Needs  . Financial resource strain: Not on file  . Food insecurity:    Worry: Not on file    Inability: Not on file  . Transportation needs:    Medical: Not on file    Non-medical: Not on file  Tobacco Use  .  Smoking status: Former Smoker    Packs/day: 1.00    Years: 20.00    Pack years: 20.00    Types: Cigarettes    Last attempt to quit: 08/29/1993    Years since quitting: 25.1  . Smokeless tobacco: Never Used  Substance and Sexual Activity  . Alcohol use: Not Currently  . Drug use: Never  . Sexual activity: Not Currently  Lifestyle  . Physical activity:    Days per week: Not on file    Minutes  per session: Not on file  . Stress: Not on file  Relationships  . Social connections:    Talks on phone: Not on file    Gets together: Not on file    Attends religious service: Not on file    Active member of club or organization: Not on file    Attends meetings of clubs or organizations: Not on file    Relationship status: Not on file  . Intimate partner violence:    Fear of current or ex partner: Not on file    Emotionally abused: Not on file    Physically abused: Not on file    Forced sexual activity: Not on file  Other Topics Concern  . Not on file  Social History Narrative  . Not on file     Family History  Problem Relation Age of Onset  . Alzheimer's disease Mother   . Heart disease Father      Current Facility-Administered Medications:  .  0.9 %  sodium chloride infusion, , Intravenous, Continuous, Demetrios Loll, MD, Last Rate: 75 mL/hr at 10/22/18 0446 .  acetaminophen (TYLENOL) tablet 650 mg, 650 mg, Oral, Q6H PRN, 650 mg at 10/17/18 2130 **OR** acetaminophen (TYLENOL) suppository 650 mg, 650 mg, Rectal, Q6H PRN, Mayo, Pete Pelt, MD .  ALPRAZolam Duanne Moron) tablet 0.25 mg, 0.25 mg, Oral, BID PRN, Saundra Shelling, MD, 0.25 mg at 10/18/18 2358 .  feeding supplement (BOOST / RESOURCE BREEZE) liquid 1 Container, 1 Container, Oral, TID BM, Demetrios Loll, MD, 1 Container at 10/21/18 1445 .  fentaNYL (DURAGESIC - dosed mcg/hr) patch 25 mcg, 25 mcg, Transdermal, Q72H, Mayo, Pete Pelt, MD, 25 mcg at 10/19/18 0949 .  gabapentin (NEURONTIN) capsule 300 mg, 300 mg, Oral, QHS, Mayo, Pete Pelt, MD, 300 mg at 10/21/18 2329 .  Gerhardt's butt cream, , Topical, TID, Epifanio Lesches, MD .  HYDROmorphone (DILAUDID) injection 0.5 mg, 0.5 mg, Intravenous, Q4H PRN, Mayo, Pete Pelt, MD, 0.5 mg at 10/15/18 0843 .  metoprolol tartrate (LOPRESSOR) tablet 12.5 mg, 12.5 mg, Oral, Q6H, Visser, Jacquelyn D, PA-C, 12.5 mg at 10/22/18 0616 .  midodrine (PROAMATINE) tablet 5 mg, 5 mg, Oral, TID WC, Demetrios Loll, MD, 5 mg at 10/21/18 1633 .  mirtazapine (REMERON) tablet 7.5 mg, 7.5 mg, Oral, QHS, Earlie Server, MD, 7.5 mg at 10/21/18 2329 .  multivitamin with minerals tablet 1 tablet, 1 tablet, Oral, Daily, Epifanio Lesches, MD, 1 tablet at 10/21/18 1002 .  ondansetron (ZOFRAN) tablet 4 mg, 4 mg, Oral, Q6H PRN **OR** ondansetron (ZOFRAN) injection 4 mg, 4 mg, Intravenous, Q6H PRN, Mayo, Pete Pelt, MD .  oxyCODONE (Oxy IR/ROXICODONE) immediate release tablet 5 mg, 5 mg, Oral, Q4H PRN, Mayo, Pete Pelt, MD, 5 mg at 10/19/18 1354 .  pravastatin (PRAVACHOL) tablet 40 mg, 40 mg, Oral, Daily, Mayo, Pete Pelt, MD, 40 mg at 10/21/18 1002 .  prochlorperazine (COMPAZINE) tablet 10 mg, 10 mg, Oral, Q6H PRN, Mayo, Pete Pelt, MD, 10 mg at 10/18/18 0944 .  sertraline (ZOLOFT) tablet 25 mg, 25 mg, Oral, Daily, Demetrios Loll, MD, 25 mg at 10/21/18 1633 .  sodium chloride flush (NS) 0.9 % injection 10-40 mL, 10-40 mL, Intracatheter, PRN, Demetrios Loll, MD .  vitamin B-12 (CYANOCOBALAMIN) tablet 1,000 mcg, 1,000 mcg, Oral, Daily, Mayo, Pete Pelt, MD, 1,000 mcg at 10/21/18 1002 .  Warfarin - Pharmacist Dosing Inpatient, , Does not apply, q1800, Lu Duffel, [facility]   Physical exam:  Vitals:   10/21/18 1953 10/22/18 0001 10/22/18 0614 10/22/18 0616  BP: 115/83 (!) 92/52 (!) 110/58   Pulse: (!) 107 (!) 105 97 (!) 108  Resp: 16   17  Temp: 97.9 F (36.6 C)   97.9 F (36.6 C)  TempSrc: Oral   Oral  SpO2: 97%   98%  Weight:        Constitutional: NAD HENT: Moist oral mucosa, Cardiovascular: Normal rate and regular rhythm.  Pulmonary/Chest:  No respiratory distress. Abdominal: Soft.  Nontender  Neurological: She is alert.  Skin: Skin is warm and dry. No erythema.  Psychiatric:  Appropriate mood Gyn : Left vulvar ulcerated mass extending to the Vagina, not able to fully exam due to pain.    CMP Latest Ref Rng & Units 10/20/2018  Glucose 70 - 99 mg/dL 127(H)  BUN 8 - 23 mg/dL 20  Creatinine 0.44 - 1.00 mg/dL  1.12(H)  Sodium 135 - 145 mmol/L 144  Potassium 3.5 - 5.1 mmol/L 4.3  Chloride 98 - 111 mmol/L 125(H)  CO2 22 - 32 mmol/L 19(L)  Calcium 8.9 - 10.3 mg/dL 7.2(L)  Total Protein 6.5 - 8.1 g/dL -  Total Bilirubin 0.3 - 1.2 mg/dL -  Alkaline Phos 38 - 126 U/L -  AST 15 - 41 U/L -  ALT 0 - 44 U/L -   CBC Latest Ref Rng & Units 10/22/2018  WBC 4.0 - 10.5 K/uL 15.3(H)  Hemoglobin 12.0 - 15.0 g/dL 8.7(L)  Hematocrit 36.0 - 46.0 % 29.4(L)  Platelets 150 - 400 K/uL 220   RADIOGRAPHIC STUDIES: I have personally reviewed the radiological images as listed and agreed with the findings in the report.  Ct Head Wo Contrast  Result Date: 09/29/2018 CLINICAL DATA:  Syncopal episode, dizziness, weakness, fall and head trauma EXAM: CT HEAD WITHOUT CONTRAST TECHNIQUE: Contiguous axial images were obtained from the base of the skull through the vertex without intravenous contrast. COMPARISON:  09/15/2018 FINDINGS: Brain: Stable atrophy pattern and chronic white matter microvascular ischemic changes throughout both cerebral hemispheres. No acute intracranial hemorrhage, new infarction, mass lesion, midline shift, herniation, hydrocephalus, or extra-axial fluid collection. No focal mass effect or edema. Cisterns are patent. Cerebellar atrophy as well. Vascular: Intracranial atherosclerosis.  No hyperdense vessel. Skull: Normal. Negative for fracture or focal lesion. Sinuses/Orbits: No acute orbital finding. Minor right ethmoid mucosal thickening. Other sinuses visualized are clear. Mastoids are clear. Other: None. IMPRESSION: Stable atrophy pattern and chronic white matter microvascular ischemic changes. No acute intracranial abnormality by noncontrast CT. Electronically Signed   By: Jerilynn Mages.  Shick M.D.   On: 09/29/2018 21:08   Ct Abdomen Pelvis W Contrast  Result Date: 10/13/2018 CLINICAL DATA:  Locally advanced vulvar cancer. Now with diarrhea and diffuse abdominal discomfort. EXAM: CT ABDOMEN AND PELVIS WITH  CONTRAST TECHNIQUE: Multidetector CT imaging of the abdomen and pelvis was performed using the standard protocol following bolus administration of intravenous contrast. CONTRAST:  43mL OMNIPAQUE IOHEXOL 300 MG/ML  SOLN COMPARISON:  None. FINDINGS: Lower chest: Large hiatal hernia. Small bilateral pleural effusions.  2-3 mm pulmonary nodule noted at the right base (1/4). Hepatobiliary: No focal abnormality within the liver parenchyma. There is no evidence for gallstones, gallbladder wall thickening, or pericholecystic fluid. No intrahepatic or extrahepatic biliary dilation. Pancreas: No focal mass lesion. No dilatation of the main duct. No intraparenchymal cyst. No peripancreatic edema. Spleen: No splenomegaly. Tiny low-density subcapsular focus posterior spleen is likely a cyst or pseudocyst. Adrenals/Urinary Tract: No adrenal nodule or mass. Tiny hypoattenuating lesions in each kidney are too small to characterize. No evidence for hydroureter. Mild circumferential bladder wall thickening evident despite underdistention. Stomach/Bowel: Large hiatal hernia with nearly the entire stomach contained in the chest. Duodenum is normally positioned as is the ligament of Treitz. No small bowel wall thickening. No small bowel dilatation. Terminal ileum unremarkable. The appendix is not visualized, but there is no edema or inflammation in the region of the cecum. Right colon is stool-filled. Left colon shows irregular circumferential wall thickening, advanced in the sigmoid colon. There is associated pericolonic edema/inflammation. Prominent stool volume noted in the rectum. Vascular/Lymphatic: There is abdominal aortic atherosclerosis without aneurysm. There is no gastrohepatic or hepatoduodenal ligament lymphadenopathy. No intraperitoneal or retroperitoneal lymphadenopathy. No pelvic sidewall lymphadenopathy. Reproductive: Uterus surgically absent.  There is no adnexal mass. Other: Small volume intraperitoneal free fluid.  Musculoskeletal: No worrisome lytic or sclerotic osseous abnormality. Diffuse body wall edema evident. IMPRESSION: 1. Marked edema and irregularity of the colonic wall, most prominently in the left colon. This is associated with substantial pericolonic edema/inflammation. Imaging features are compatible with the reported history of C diff colitis. 2. Small volume intraperitoneal free fluid with diffuse body wall edema. 3. Very large hiatal hernia. 4. 2-3 mm pulmonary nodule at the right lung base. No follow-up needed if patient is low-risk. Non-contrast chest CT can be considered in 12 months if patient is high-risk. This recommendation follows the consensus statement: Guidelines for Management of Incidental Pulmonary Nodules Detected on CT Images: From the Fleischner Society 2017; Radiology 2017; 284:228-243. 5.  Aortic Atherosclerois (ICD10-170.0) Electronically Signed   By: Misty Stanley M.D.   On: 10/13/2018 18:11    Assessment and plan-  Patient is a 77 y.o. female with history of recurrent locally advanced vulvar cancer, currently on chemotherapy, A. fib on Coumadin, hypertension, anxiety/depression presents for evaluation of abdominal discomfort and C. difficile colitis.  #C. difficile colitis/diarrhea, clinically improved.  Finish p.o. Vancomycin for 10 days course.  Leukocytosis #Hypotension, Likely secondary to hypovolemia.  Improved  #Recurrent vulvar cancer,  overall poor prognosis.    #Goal of care discussion  I discussed with patient about the overall poor prognosis, likely more complication than benefit if continue on chemotherapy. Patient desires treatment.  She does not want to go to a facility.  She wants to go home. I asked her about her choice at the end of life, she prefers to do home hospice. She has not discussed this with her daughter-in-law and she feels may be daughter-in-law may not support this decision. Family meeting arranged today for further conversation about  goals. # Supratherapeutic INR hold Coumadin.  Thank you for allowing me to participate in the care of this patient.  We will continue to follow her inpatient course. Discussed with hospitalist team about above plan. Total face to face encounter time for this patient visit was 25 min. >50% of the time was  spent in counseling and coordination of care.  Earlie Server, MD, PhD Hematology Oncology [facility] Pager- [phone] 10/22/2018

## 2018-10-22 NOTE — Telephone Encounter (Signed)
Okay to have her seen next week. Please schedule.

## 2018-10-22 NOTE — Consult Note (Signed)
[facility] for warfarin dosing Indication: atrial fibrillation  No Known Allergies  Labs: Recent Labs    10/20/18 0517 10/21/18 0515 10/22/18 0433  HGB 9.7*  --   --   HCT 32.6*  --   --   PLT 268  --   --   LABPROT 29.4* 32.9* 36.6*  INR 2.84 3.28 3.76  CREATININE 1.12*  --   --     Estimated Creatinine Clearance: 40 mL/min (A) (by C-G formula based on SCr of 1.12 mg/dL (H)).   Assessment: 77 year-old female female on warfarin 3mg  daily PTA  On a recent admission here in September she exhibited the following response:  DATE   INR      Dose   9/14     1.84     3 mg  9/15     2.73     hold  9/16     2.36     2mg  9/17     2.67     2mg    10/12   5.97     Held 10/13   7.01     Held 10/14   5.3       Held 10/15   8.94     Held - provider ordered IV VitK 1mg  once. 10/16   3.53     Held 10/17   3.42     Held 10/18   3.63     Held 10/19   3.23     Held 10/20   3.25     Held 10/21   2.84     Restart @ 3mg  10/22   3.28     Hold  10/23   3.76     Hold   Goal of Therapy:  INR 2-3 Monitor platelets by anticoagulation protocol: Yes   Plan:  INR supratherapeutic today.  Continuing to hold warfarin.  Follow up daily INR daily and CBC every 3 days and pharmacy will continue to monitior.   Pharmacy will continue to follow and adjust as per consult.   Lu Duffel, PharmD Clinical Pharmacist 10/22/2018 7:35 AM

## 2018-10-22 NOTE — Progress Notes (Signed)
Progress Note  Patient Name: Virginia Murray Date of Encounter: 10/22/2018  Primary Cardiologist: [facility] - Dr. Fletcher Anon  Subjective   Patient denies any cardiac complaints including chest pain, palpitation, or feeling of racing heart rate. She reportedly was able to ambulate without complaints yesterday. She does continue to report b/l LEE, which she feels is the same as yesterday.   She appears more energetic than yesterday.  Inpatient Medications    Scheduled Meds: . feeding supplement  1 Container Oral TID BM  . fentaNYL  25 mcg Transdermal Q72H  . gabapentin  300 mg Oral QHS  . Gerhardt's butt cream   Topical TID  . metoprolol tartrate  12.5 mg Oral Q6H  . midodrine  5 mg Oral TID WC  . mirtazapine  7.5 mg Oral QHS  . multivitamin with minerals  1 tablet Oral Daily  . pravastatin  40 mg Oral Daily  . sertraline  25 mg Oral Daily  . cyanocobalamin  1,000 mcg Oral Daily  . Warfarin - Pharmacist Dosing Inpatient   Does not apply q1800   Continuous Infusions: . sodium chloride 75 mL/hr at 10/22/18 0446   PRN Meds: acetaminophen **OR** acetaminophen, ALPRAZolam, HYDROmorphone (DILAUDID) injection, ondansetron **OR** ondansetron (ZOFRAN) IV, oxyCODONE, prochlorperazine, sodium chloride flush   Vital Signs    Vitals:   10/21/18 1953 10/22/18 0001 10/22/18 0614 10/22/18 0616  BP: 115/83 (!) 92/52 (!) 110/58   Pulse: (!) 107 (!) 105 97 (!) 108  Resp: 16   17  Temp: 97.9 F (36.6 C)   97.9 F (36.6 C)  TempSrc: Oral   Oral  SpO2: 97%   98%  Weight:        Intake/Output Summary (Last 24 hours) at 10/22/2018 0735 Last data filed at 10/21/2018 1522 Gross per 24 hour  Intake 106.66 ml  Output -  Net 106.66 ml   Filed Weights   10/21/18 0418  Weight: 67.5 kg    Telemetry    IRIR, Afib v Aflutter, ventricular rates in 100s - Personally Reviewed   Physical Exam   GEN: No acute distress. Frail, elderly woman. Eating breakfast Neck: No JVD Cardiac:   Irregularly irregular, tachycardic. no murmurs, rubs, or gallops.  Respiratory: Reduced breath sounds bilaterally GI: Soft, nontender, non-distended  MS: Mild bilateral LEE; No deformity. Neuro:  Nonfocal  Psych: Normal response and affect  Labs    Chemistry Recent Labs  Lab 10/16/18 0332 10/19/18 0632 10/20/18 0517  NA 144 146* 144  K 4.2 4.5 4.3  CL 123* 125* 125*  CO2 18* 16* 19*  GLUCOSE 125* 129* 127*  BUN 32* 18 20  CREATININE 1.15* 1.00 1.12*  CALCIUM 7.4* 7.1* 7.2*  GFRNONAA 45* 54* 47*  GFRAA 53* >60 54*  ANIONGAP 3* 5 0*     Hematology Recent Labs  Lab 10/18/18 0641 10/19/18 0632 10/20/18 0517  WBC 14.1* 16.3* 16.2*  RBC 4.19 4.21 3.79*  HGB 10.9* 11.0* 9.7*  HCT 36.4 37.8 32.6*  MCV 86.9 89.8 86.0  MCH 26.0 26.1 25.6*  MCHC 29.9* 29.1* 29.8*  RDW 17.4* 17.5* 18.0*  PLT 238 256 268    Cardiac EnzymesNo results for input(s): TROPONINI in the last 168 hours. No results for input(s): TROPIPOC in the last 168 hours.   BNPNo results for input(s): BNP, PROBNP in the last 168 hours.   DDimer No results for input(s): DDIMER in the last 168 hours.   Radiology    No results found.  Cardiac Studies  Echocardiogram in September 2019 showed normal LV systolic function and no significant valvular abnormalities  Patient Profile     77 y.o. female with history of paroxysmal atrial fibrillation on long-term anticoagulation with warfarin, recurrent vulvar cancer, hypertension and hyperlipidemia who presented with C. difficile colitis and dehydration and developed atrial fibrillation with rapid ventricular response.  Assessment & Plan    1.  Paroxysmal atrial fibrillation with rapid ventricular response:   - CHA2DS2VASc score of at least 4 (HTN, age x2, female). Continue warfarin. - Ventricular rates remain elevated, BP soft yet more stable today d/t holding last two doses metoprolol. Continue metoprolol 12.5mg  q6 with hold paramaters for SBP <85 or symptoms  of near syncope. Consider rapid ventricular rate in the setting of dehydration - ordered and pending updated BMET to assess Cr / renal function. Hopefully, improvement in HR with improvement in BP. Recommend wean off midodrine as soon as possible with gentle IVF to support BP / renal function & continue monitoring of volume status.   2.  Hypotension on mild acute renal failure: - Cr 1.12 yesterday (baseline ~0.82). No BMET- ordered updated labs. - BP remains soft (yet improved) from yesterday. Consider weaning off midodrine if possible. Consider gentle fluids to support BP /renal function (pending BMET update).     3.  C. difficile colitis:  - On oral vancomycin.   4.  Recurrent vulvar cancer:  - Not a candidate for repeat surgery and overall prognosis seems to be poor.     For questions or updates, please contact [place] Please consult [email or web address] for contact info under [facility].       Signed, Arvil Chaco, PA-C  10/22/2018, 7:35 AM

## 2018-10-22 NOTE — Discharge Summary (Signed)
[facility] [facility] [place] NAME: Virginia Murray    MR#:  [id number]  DATE OF BIRTH:  Jul 30, 1941  DATE OF ADMISSION:  10/11/2018   ADMITTING PHYSICIAN: Sela Hua, MD  DATE OF DISCHARGE: 10/22/2018  PRIMARY CARE PHYSICIAN: Pleas Koch, NP   ADMISSION DIAGNOSIS:  Diarrhea of infectious origin [A09] DISCHARGE DIAGNOSIS:  Principal Problem:   Grief Active Problems:   Generalized anxiety disorder   C. difficile colitis   Generalized abdominal pain   Depression   Elevated INR   Hypotension due to hypovolemia   Palliative care encounter  SECONDARY DIAGNOSIS:   Past Medical History:  Diagnosis Date  . A-fib ([place])   . Cancer (HCC)    vulva   . Depression   . Diverticulitis   . Hyperlipemia   . Hypertension   . Vulva cancer [facility])    HOSPITAL COURSE:  #1. C. difficile colitis: Diarrhea improved. Completed p.o. Vancomycin for 10 days total.  CT abdomen showed Marked edema and irregularity of colonic wall consistent with colitis.  WBC decreased to 15.3 from peak of 22.1. Follow-up CBC as outpatient.  2.  Chronic hypotension: better BP with IV fluids, PRN boluses for hypotension, continue to hold Norvasc, atenolol, benazepril.  Started Midodrin.  #3 .  Paroxysmal atrial fibrillation RVR with Supratherapeutic INR up to 8.94 , status post vitamin K. INR  3.76, hold Coumadin, follow-up INR with PCP.  May resume Coumadin at 1.5 mg at bedtime if INR decreased to therapeutic range. Per Dr. Rockey Situ, Lopressor 12.5 mg Q12h, Hold Lopressor if BP is low.  Started digoxin 0.125 mg p.o. daily per Dr. Rockey Situ.  #4 .recent GBS bacteremia, hospitalized from 9/14 , 9/18, patient received Rocephin for 2 weeks via port, repeat blood cultures from 101/1 at oncology office did not show any growth for 24 hours.  5.  Recurrent vulvar cancer, patient is undergoing chemotherapy, history of radical vulvectomy in 2007, follows up with Dr. Tasia Catchings as an  outpatient, according to Dr. Collie Siad note patient is not a candidate for further gynecological surgery due to her poor performance status.  Dr. Tasia Catchings discussed with the event oncology Dr. Theora Gianotti.  Patient seen by palliative care team Josh as an outpatient.  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 She is due for chemotherapy next Friday. Will reassess her condition prior to the chemotherapy if she is discharged per Dr. Tasia Catchings.  Patient lost her husband recently and has been having some hallucinations, No indication to start any psychiatric medicine at this point no evidence of dangerousness per Dr. Weber Cooks.  #6. hyperlipidemia: Continue statins.  #7.  Generalized weakness.  PT evaluation: HHPT.  Hypomagnesemia.  Improved with IV magnesium.  Palliative care nurse practitioner Josh scheduled family meeting today. DISCHARGE CONDITIONS:  Stable, discharge to home with home health and PT today. CONSULTS OBTAINED:  Treatment Team:  Clapacs, Madie Reno, MD Wellington Hampshire, MD DRUG ALLERGIES:  No Known Allergies DISCHARGE MEDICATIONS:   Allergies as of 10/22/2018   No Known Allergies     Medication List    STOP taking these medications   atenolol 100 MG tablet Commonly known as:  TENORMIN   benazepril 40 MG tablet Commonly known as:  LOTENSIN   doxazosin 8 MG tablet Commonly known as:  CARDURA   loperamide 2 MG capsule Commonly known as:  IMODIUM   senna 8.6 MG Tabs tablet Commonly known  as:  SENOKOT   vancomycin 125 MG capsule Commonly known as:  VANCOCIN   warfarin 1 MG tablet Commonly known as:  COUMADIN     TAKE these medications   cyanocobalamin 1000 MCG tablet Take 1 tablet (1,000 mcg total) by mouth daily.   dexamethasone 4 MG tablet Commonly known as:  DECADRON Take 2 tablets (8 mg total) by mouth daily. Start the day after chemotherapy for 2 days.   digoxin 0.125 MG  tablet Commonly known as:  LANOXIN Take 1 tablet (0.125 mg total) by mouth daily. Start taking on:  10/23/2018   fentaNYL 25 MCG/HR patch Commonly known as:  DURAGESIC - dosed mcg/hr Place 1 patch (25 mcg total) onto the skin every 3 (three) days.   gabapentin 300 MG capsule Commonly known as:  NEURONTIN Take 1 capsule (300 mg total) by mouth at bedtime. For leg cramps   lidocaine-prilocaine cream Commonly known as:  EMLA Apply to affected area once   metoprolol tartrate 25 MG tablet Commonly known as:  LOPRESSOR Take 0.5 tablets (12.5 mg total) by mouth every 6 (six) hours.   midodrine 5 MG tablet Commonly known as:  PROAMATINE Take 1 tablet (5 mg total) by mouth 3 (three) times daily with meals.   ondansetron 8 MG tablet Commonly known as:  ZOFRAN Take 1 tablet (8 mg total) by mouth 2 (two) times daily as needed for refractory nausea / vomiting. Start on day 3 after chemo.   oxyCODONE 5 MG immediate release tablet Commonly known as:  Oxy IR/ROXICODONE Take 1 tablet (5 mg total) by mouth every 4 (four) hours as needed for moderate pain or severe pain.   polyethylene glycol packet Commonly known as:  MIRALAX / GLYCOLAX Take 17 g by mouth daily as needed for mild constipation or moderate constipation.   pravastatin 40 MG tablet Commonly known as:  PRAVACHOL Take 40 mg by mouth daily.   prochlorperazine 10 MG tablet Commonly known as:  COMPAZINE Take 1 tablet (10 mg total) by mouth every 6 (six) hours as needed (Nausea or vomiting).   sertraline 25 MG tablet Commonly known as:  ZOLOFT Take 1 tablet (25 mg total) by mouth daily.        DISCHARGE INSTRUCTIONS:  See AVS. If you experience worsening of your admission symptoms, develop shortness of breath, life threatening emergency, suicidal or homicidal thoughts you must seek medical attention immediately by calling 911 or calling your MD immediately  if symptoms less severe.  You Must read complete  instructions/literature along with all the possible adverse reactions/side effects for all the Medicines you take and that have been prescribed to you. Take any new Medicines after you have completely understood and accpet all the possible adverse reactions/side effects.   Please note  You were cared for by a hospitalist during your hospital stay. If you have any questions about your discharge medications or the care you received while you were in the hospital after you are discharged, you can call the unit and asked to speak with the hospitalist on call if the hospitalist that took care of you is not available. Once you are discharged, your primary care physician will handle any further medical issues. Please note that NO REFILLS for any discharge medications will be authorized once you are discharged, as it is imperative that you return to your primary care physician (or establish a relationship with a primary care physician if you do not have one) for your aftercare needs so that they  can reassess your need for medications and monitor your lab values.    On the day of Discharge:  VITAL SIGNS:  Blood pressure 103/60, pulse (!) 107, temperature 97.9 F (36.6 C), temperature source Oral, resp. rate 14, weight 67.5 kg, SpO2 96 %. PHYSICAL EXAMINATION:  GENERAL:  77 y.o.-year-old patient lying in the bed with no acute distress.  EYES: Pupils equal, round, reactive to light and accommodation. No scleral icterus. Extraocular muscles intact.  HEENT: Head atraumatic, normocephalic. Oropharynx and nasopharynx clear.  NECK:  Supple, no jugular venous distention. No thyroid enlargement, no tenderness.  LUNGS: Normal breath sounds bilaterally, no wheezing, rales,rhonchi or crepitation. No use of accessory muscles of respiration.  CARDIOVASCULAR: S1, S2 normal. No murmurs, rubs, or gallops.  ABDOMEN: Soft, non-tender, non-distended. Bowel sounds present. No organomegaly or mass.  EXTREMITIES: No pedal  edema, cyanosis, or clubbing.  NEUROLOGIC: Cranial nerves II through XII are intact. Muscle strength 5/5 in all extremities. Sensation intact. Gait not checked.  PSYCHIATRIC: The patient is alert and oriented x 3.  SKIN: No obvious rash, lesion, or ulcer.  DATA REVIEW:   CBC Recent Labs  Lab 10/22/18 0433  WBC 15.3*  HGB 8.7*  HCT 29.4*  PLT 220    Chemistries  Recent Labs  Lab 10/20/18 0517  NA 144  K 4.3  CL 125*  CO2 19*  GLUCOSE 127*  BUN 20  CREATININE 1.12*  CALCIUM 7.2*  MG 2.1     Microbiology Results  Results for orders placed or performed in visit on 10/10/18  C difficile quick screen w PCR reflex     Status: Abnormal   Collection Time: 10/10/18  2:41 PM  Result Value Ref Range Status   C Diff antigen POSITIVE (A) NEGATIVE Final   C Diff toxin POSITIVE (A) NEGATIVE Final   C Diff interpretation Toxin producing C. difficile detected.  Final    Comment: CRITICAL RESULT CALLED TO, READ BACK BY AND VERIFIED WITH: DR. Mike Gip AT 6384 10/10/18.PMH Performed at [facility], [address], [place], [place] [address]     RADIOLOGY:  No results found.   Management plans discussed with the patient, family and they are in agreement.  CODE STATUS: DNR   TOTAL TIME TAKING CARE OF THIS PATIENT: 33 minutes.    Demetrios Loll M.D on 10/22/2018 at 3:59 PM  Between 7am to 6pm - Pager - [phone]  After 6pm go to [email or web address] - Proofreader  [facility]  Office  [phone]  CC: Primary care physician; Pleas Koch, NP   Note: This dictation was prepared with Dragon dictation along with smaller phrase technology. Any transcriptional errors that result from this process are unintentional.

## 2018-10-22 NOTE — Care Management Note (Signed)
Case Management Note  Patient Details  Name: Virginia Murray MRN: [id number] Date of Birth: [birth year]  Subjective/Objective:  Patient to be discharged per MD order. Orders in place for home health services. Patient and family agreeable to home health and have chosen [facility]. Referral placed with Corene Cornea who agrees to accept the patient for PT,RN,SW and aide. No DME needs. Family to transport at discharge.  Ines Bloomer RN BSN RNCM [phone]                    Action/Plan:   Expected Discharge Date:  10/22/18               Expected Discharge Plan:  [place]  In-House Referral:  Hospice / Palliative Care  Discharge planning Services  CM Consult  Post Acute Care Choice:  Home Health Choice offered to:  Patient  DME Arranged:    DME Agency:     HH Arranged:  RN, PT, Nurse's Aide, Social Work [employer] Agency:  [facility]  Status of Service:  Completed, signed off  If discussed at [employer] [facility] [employer], dates discussed:    Additional Comments:  Latanya Maudlin, RN 10/22/2018, 1:57 PM

## 2018-10-22 NOTE — Progress Notes (Signed)
I met with patient, son, daughter-in-law, and granddaughter. Patient's family expressed significant concerns with her returning home.  They say that patient lives on the second floor of a house and they do not feel patient would be able to ambulate the steps safely. Additionally, family work during the day so there would not be anyone home to care for her or assist with transfers/ambulation. Family are requesting rehab and patient says she would agree with that plan.   PT eval from 10/21 reviewed. I note recommendation for home health. However, given patient's inability to transfer independently without assistance and lack of caregiver support in the home, it would seem that rehab would be a safer disposition. I feel that patient is generally at high risk for recurrent hospitalization. However, risk of readmission would be even higher in the event of discharge home.   I called and spoke with Dr. Bridgett Larsson who requested that I cancel the discharge order. I have also ordered SW to assist with rehab.   Note that I attempted to engage patient in a conversation regarding her goals, medical problems, and potential for decline. Patient did not seem to be very insightful regarding her medical problems or prognosis. Patient says she is optimistic that she will improve. She says she would continue to want oncologic treatment if offered.   Plan: Cancel discharge SW consult Recommend rehab

## 2018-10-23 LAB — BASIC METABOLIC PANEL
Anion gap: 4 — ABNORMAL LOW (ref 5–15)
BUN: 18 mg/dL (ref 8–23)
CHLORIDE: 119 mmol/L — AB (ref 98–111)
CO2: 20 mmol/L — AB (ref 22–32)
CREATININE: 0.98 mg/dL (ref 0.44–1.00)
Calcium: 7.5 mg/dL — ABNORMAL LOW (ref 8.9–10.3)
GFR calc Af Amer: >60 mL/min (ref >60)
GFR calc non Af Amer: 55 mL/min — ABNORMAL LOW (ref >60)
GLUCOSE: 110 mg/dL — AB (ref 70–99)
POTASSIUM: 4.5 mmol/L (ref 3.5–5.1)
SODIUM: 143 mmol/L (ref 135–145)

## 2018-10-23 LAB — PROTIME-INR
INR: 3.19
Prothrombin Time: 32.2 seconds — ABNORMAL HIGH (ref 11.4–15.2)

## 2018-10-23 MED ORDER — METOPROLOL SUCCINATE ER 25 MG PO TB24
25.0000 mg | ORAL_TABLET | Freq: Every day | ORAL | Status: DC
  Administered 2018-10-23: 25 mg via ORAL
  Filled 2018-10-23: qty 1

## 2018-10-23 MED ORDER — HEPARIN SOD (PORK) LOCK FLUSH 100 UNIT/ML IV SOLN
INTRAVENOUS | Status: AC
  Administered 2018-10-23: 17:00:00 500 [IU]
  Filled 2018-10-23: qty 5

## 2018-10-23 MED ORDER — METOPROLOL SUCCINATE ER 25 MG PO TB24: 25.0000 mg | ORAL_TABLET | Freq: Every day | ORAL | Status: AC

## 2018-10-23 MED ORDER — FENTANYL 25 MCG/HR TD PT72: 25.0000 ug | MEDICATED_PATCH | TRANSDERMAL | 0 refills | Status: AC

## 2018-10-23 MED ORDER — OXYCODONE HCL 5 MG PO TABS: 5.0000 mg | ORAL_TABLET | ORAL | 0 refills | Status: AC | PRN

## 2018-10-23 NOTE — Clinical Social Work Placement (Signed)
   CLINICAL SOCIAL WORK PLACEMENT  NOTE  Date:  10/23/2018  Patient Details  Name: Virginia Murray MRN: [id number] Date of Birth: 1941/04/08  Clinical Social Work is seeking post-discharge placement for this patient at the [place] level of care (*CSW will initial, date and re-position this form in  chart as items are completed):  Yes   Patient/family provided with [place] Work Department's list of facilities offering this level of care within the geographic area requested by the patient (or if unable, by the patient's family).  Yes   Patient/family informed of their freedom to choose among providers that offer the needed level of care, that participate in Medicare, Medicaid or managed care program needed by the patient, have an available bed and are willing to accept the patient.  Yes   Patient/family informed of [facility]'s ownership interest in [facility] and [facility], as well as of the fact that they are under no obligation to receive care at these facilities.  PASRR submitted to EDS on 10/23/18     PASRR number received on 10/23/18     Existing PASRR number confirmed on       FL2 transmitted to all facilities in geographic area requested by pt/family on 10/23/18     FL2 transmitted to all facilities within larger geographic area on       Patient informed that his/her managed care company has contracts with or will negotiate with certain facilities, including the following:            Patient/family informed of bed offers received.  Patient chooses bed at       Physician recommends and patient chooses bed at      Patient to be transferred to   on  .  Patient to be transferred to facility by       Patient family notified on   of transfer.  Name of family member notified:        PHYSICIAN       Additional Comment:    _______________________________________________ Annamaria Boots, [place] 10/23/2018, 9:14  AM

## 2018-10-23 NOTE — Progress Notes (Signed)
   10/23/18 1425  Clinical Encounter Type  Visited With Patient  Visit Type Initial;Spiritual support  Referral From Nurse  Consult/Referral To Chaplain  Spiritual Encounters  Spiritual Needs Prayer;Emotional;Grief support   [place] visited with Ms. Coffelt. The patient seemed indifferent to my visit but perked up as we talked. She was very tired and spoke briefly about her husband, who just recently passed. The patient appeared to have many bruises from blood draws and IV's. I practiced pastoral presences and active listening.

## 2018-10-23 NOTE — Progress Notes (Signed)
MD order received to discharge pt to SNF today in [facility]; Care Management previously prepared discharge packet for EMS personnel to take to the facility; pt to be discharged to [facility] via EMS; telephone report called to [place] at [phone] to the RN for Room 26, no questions voiced at this time; discharge pending arrival of EMS for nonemergency transport

## 2018-10-23 NOTE — Consult Note (Signed)
[place] for warfarin dosing Indication: atrial fibrillation  No Known Allergies  Labs: Recent Labs    10/21/18 0515 10/22/18 0433 10/23/18 0538  HGB  --  8.7*  --   HCT  --  29.4*  --   PLT  --  220  --   LABPROT 32.9* 36.6* 32.2*  INR 3.28 3.76 3.19    Estimated Creatinine Clearance: 40.4 mL/min (A) (by C-G formula based on SCr of 1.12 mg/dL (H)).   Assessment: 77 year-old female female on warfarin 3mg  daily PTA  On a recent admission here in September she exhibited the following response:  DATE   INR      Dose   9/14     1.84     3 mg  9/15     2.73     hold  9/16     2.36     2mg  9/17     2.67     2mg    10/12   5.97     Held 10/13   7.01     Held 10/14   5.3       Held 10/15   8.94     Held - provider ordered IV VitK 1mg  once. 10/16   3.53     Held 10/17   3.42     Held 10/18   3.63     Held 10/19   3.23     Held 10/20   3.25     Held 10/21   2.84     Restart @ 3mg  10/22   3.28     Hold  10/23   3.76     Hold 10/24   3.19     Hold   Goal of Therapy:  INR 2-3 Monitor platelets by anticoagulation protocol: Yes   Plan:  INR supratherapeutic today.  Continuing to hold warfarin.  Follow up daily INR daily and CBC every 3 days and pharmacy will continue to monitior.  Per Dr. Bridgett Larsson may resume Coumadin at 1.5 mg at bedtime if INR decreased to therapeutic range.  Pharmacy will continue to follow and adjust as per consult.   Lu Duffel, PharmD Clinical Pharmacist 10/23/2018 7:32 AM

## 2018-10-23 NOTE — Progress Notes (Signed)
No plan to discharge today to rehab at [facility].  I stopped by patient's room but she asked me to leave.  I would recommend the patient is followed by palliative care at SNF.

## 2018-10-23 NOTE — Progress Notes (Signed)
EMS present for pt discharge; pt discharged via stretcher by EMS to [facility]

## 2018-10-23 NOTE — Clinical Social Work Note (Signed)
Clinical Social Work Assessment  Patient Details  Name: Virginia Murray MRN: [id number] Date of Birth: 09/17/1941  Date of referral:  10/23/18               Reason for consult:  Facility Placement                Permission sought to share information with:  Case Manager, Customer service manager, Family Supports Permission granted to share information::  Yes, Verbal Permission Granted  Name::        Agency::     Relationship::     Contact Information:     Housing/Transportation Living arrangements for the past 2 months:  Single Family Home Source of Information:  Patient Patient Interpreter Needed:  None Criminal Activity/Legal Involvement Pertinent to Current Situation/Hospitalization:  No - Comment as needed Significant Relationships:  Adult Children Lives with:  Self Do you feel safe going back to the place where you live?  Yes Need for family participation in patient care:  Yes (Comment)  Care giving concerns:  Patient lives alone in [place] Worker assessment / plan:  CSW consulted for SNF placement. CSW found through chart review that patient was recommended for home health with PT but MD and Palliative NP have determined that patient needs to go to SNF for rehab. CSW met with patient to discuss discharge plan. Patient states that she lives alone and doesn't feel safe to return yet. CSW explained be search process and how insurance covers SNF. Patient is in agreement with bed search. CSW will initiate bed search and give bed offers once received.   Employment status:  Retired Forensic scientist:  Medicare PT Recommendations:  Home with [place] / Referral to community resources:  [place]  Patient/Family's Response to care:  Patient thanked CSW for assistance   Patient/Family's Understanding of and Emotional Response to Diagnosis, Current Treatment, and Prognosis:  Patient in agreement with discharge plan    Emotional Assessment Appearance:  Appears stated age Attitude/Demeanor/Rapport:    Affect (typically observed):  Accepting, Pleasant Orientation:  Oriented to Self, Oriented to Place, Oriented to  Time, Oriented to Situation Alcohol / Substance use:  Not Applicable Psych involvement (Current and /or in the community):  No (Comment)  Discharge Needs  Concerns to be addressed:  Discharge Planning Concerns Readmission within the last 30 days:  No Current discharge risk:  None Barriers to Discharge:  Continued Medical Work up   [employer], [place] 10/23/2018, 9:41 AM

## 2018-10-23 NOTE — Discharge Summary (Signed)
[place] [facility] [place] NAME: Virginia Murray    MR#:  [id number]  DATE OF BIRTH:  1941/04/23  DATE OF ADMISSION:  10/11/2018   ADMITTING PHYSICIAN: Sela Hua, MD  DATE OF DISCHARGE: 10/23/2018  PRIMARY CARE PHYSICIAN: Pleas Koch, NP   ADMISSION DIAGNOSIS:  Diarrhea of infectious origin [A09] DISCHARGE DIAGNOSIS:  Principal Problem:   Grief Active Problems:   Generalized anxiety disorder   C. difficile colitis   Generalized abdominal pain   Depression   Elevated INR   Hypotension due to hypovolemia   Palliative care encounter  SECONDARY DIAGNOSIS:   Past Medical History:  Diagnosis Date  . A-fib ([place])   . Cancer (HCC)    vulva   . Depression   . Diverticulitis   . Hyperlipemia   . Hypertension   . Vulva cancer [facility])    HOSPITAL COURSE:  #1. C. difficile colitis: Diarrhea improved. Completed p.o. Vancomycin for 10 days total.  CT abdomen showed Marked edema and irregularity of colonic wall consistent with colitis.  WBC decreased to 15.3 from peak of 22.1. Follow-up CBC as outpatient.  2.  Chronic hypotension: better BP with IV fluids, PRN boluses for hypotension, continue to hold Norvasc, atenolol, benazepril.  Started Midodrin.  #3 .  Paroxysmal atrial fibrillation RVR with Supratherapeutic INR up to 8.94 , status post vitamin K. INR  3.76, hold Coumadin, follow-up INR with PCP.  May resume Coumadin at 1.5 mg at bedtime if INR decreased to therapeutic range. Per Dr. Rockey Situ, Lopressor 12.5 mg Q12h, Hold Lopressor if BP is low.  Started digoxin 0.125 mg p.o. daily per Dr. Rockey Situ.  #4 .recent GBS bacteremia, hospitalized from 9/14 , 9/18, patient received Rocephin for 2 weeks via port, repeat blood cultures from 101/1 at oncology office did not show any growth for 24 hours.  5.  Recurrent vulvar cancer, patient is undergoing chemotherapy, history of radical vulvectomy in 2007, follows up with Dr. Tasia Catchings as an  outpatient, according to Dr. Collie Siad note patient is not a candidate for further gynecological surgery due to her poor performance status.  Dr. Tasia Catchings discussed with the event oncology Dr. Theora Gianotti.  Patient seen by palliative care team Josh as an outpatient.  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 She is due for chemotherapy next Friday. Will reassess her condition prior to the chemotherapy if she is discharged per Dr. Tasia Catchings.  Patient lost her husband recently and has been having some hallucinations, No indication to start any psychiatric medicine at this point no evidence of dangerousness per Dr. Weber Cooks.  #6. hyperlipidemia: Continue statins.  #7.  Generalized weakness.  PT evaluation: HHPT.  Hypomagnesemia.  Improved with IV magnesium. The family members has concerns about discharging to home since the patient lives alone and weak. Discussed with palliative care Josh and SW. Discharge to SNF today. DISCHARGE CONDITIONS:  Stable, discharge to SNF today. CONSULTS OBTAINED:  Treatment Team:  Clapacs, Madie Reno, MD Wellington Hampshire, MD DRUG ALLERGIES:  No Known Allergies DISCHARGE MEDICATIONS:   Allergies as of 10/23/2018   No Known Allergies     Medication List    STOP taking these medications   atenolol 100 MG tablet Commonly known as:  TENORMIN   benazepril 40 MG tablet Commonly known as:  LOTENSIN   doxazosin 8 MG tablet Commonly known as:  CARDURA   loperamide 2 MG capsule Commonly known  as:  IMODIUM   senna 8.6 MG Tabs tablet Commonly known as:  SENOKOT   vancomycin 125 MG capsule Commonly known as:  VANCOCIN   warfarin 1 MG tablet Commonly known as:  COUMADIN     TAKE these medications   cyanocobalamin 1000 MCG tablet Take 1 tablet (1,000 mcg total) by mouth daily.   dexamethasone 4 MG tablet Commonly known as:  DECADRON Take 2 tablets (8 mg total) by mouth daily. Start the day  after chemotherapy for 2 days.   digoxin 0.125 MG tablet Commonly known as:  LANOXIN Take 1 tablet (0.125 mg total) by mouth daily.   fentaNYL 25 MCG/HR patch Commonly known as:  DURAGESIC - dosed mcg/hr Place 1 patch (25 mcg total) onto the skin every 3 (three) days.   gabapentin 300 MG capsule Commonly known as:  NEURONTIN Take 1 capsule (300 mg total) by mouth at bedtime. For leg cramps   lidocaine-prilocaine cream Commonly known as:  EMLA Apply to affected area once   metoprolol succinate 25 MG 24 hr tablet Commonly known as:  TOPROL-XL Take 1 tablet (25 mg total) by mouth daily.   midodrine 5 MG tablet Commonly known as:  PROAMATINE Take 1 tablet (5 mg total) by mouth 3 (three) times daily with meals.   ondansetron 8 MG tablet Commonly known as:  ZOFRAN Take 1 tablet (8 mg total) by mouth 2 (two) times daily as needed for refractory nausea / vomiting. Start on day 3 after chemo.   oxyCODONE 5 MG immediate release tablet Commonly known as:  Oxy IR/ROXICODONE Take 1 tablet (5 mg total) by mouth every 4 (four) hours as needed for moderate pain, severe pain or breakthrough pain. What changed:  reasons to take this   polyethylene glycol packet Commonly known as:  MIRALAX / GLYCOLAX Take 17 g by mouth daily as needed for mild constipation or moderate constipation.   pravastatin 40 MG tablet Commonly known as:  PRAVACHOL Take 40 mg by mouth daily.   prochlorperazine 10 MG tablet Commonly known as:  COMPAZINE Take 1 tablet (10 mg total) by mouth every 6 (six) hours as needed (Nausea or vomiting).   sertraline 25 MG tablet Commonly known as:  ZOLOFT Take 1 tablet (25 mg total) by mouth daily.        DISCHARGE INSTRUCTIONS:  See AVS. If you experience worsening of your admission symptoms, develop shortness of breath, life threatening emergency, suicidal or homicidal thoughts you must seek medical attention immediately by calling 911 or calling your MD immediately   if symptoms less severe.  You Must read complete instructions/literature along with all the possible adverse reactions/side effects for all the Medicines you take and that have been prescribed to you. Take any new Medicines after you have completely understood and accpet all the possible adverse reactions/side effects.   Please note  You were cared for by a hospitalist during your hospital stay. If you have any questions about your discharge medications or the care you received while you were in the hospital after you are discharged, you can call the unit and asked to speak with the hospitalist on call if the hospitalist that took care of you is not available. Once you are discharged, your primary care physician will handle any further medical issues. Please note that NO REFILLS for any discharge medications will be authorized once you are discharged, as it is imperative that you return to your primary care physician (or establish a relationship with a primary  care physician if you do not have one) for your aftercare needs so that they can reassess your need for medications and monitor your lab values.    On the day of Discharge:  VITAL SIGNS:  Blood pressure (!) 107/57, pulse 92, temperature 98 F (36.7 C), temperature source Oral, resp. rate 18, weight 68.7 kg, SpO2 99 %. PHYSICAL EXAMINATION:  GENERAL:  77 y.o.-year-old patient lying in the bed with no acute distress.  EYES: Pupils equal, round, reactive to light and accommodation. No scleral icterus. Extraocular muscles intact.  HEENT: Head atraumatic, normocephalic. Oropharynx and nasopharynx clear.  NECK:  Supple, no jugular venous distention. No thyroid enlargement, no tenderness.  LUNGS: Normal breath sounds bilaterally, no wheezing, rales,rhonchi or crepitation. No use of accessory muscles of respiration.  CARDIOVASCULAR: S1, S2 normal. No murmurs, rubs, or gallops.  ABDOMEN: Soft, non-tender, non-distended. Bowel sounds present. No  organomegaly or mass.  EXTREMITIES: No pedal edema, cyanosis, or clubbing.  NEUROLOGIC: Cranial nerves II through XII are intact. Muscle strength 5/5 in all extremities. Sensation intact. Gait not checked.  PSYCHIATRIC: The patient is alert and oriented x 3.  SKIN: No obvious rash, lesion, or ulcer.  DATA REVIEW:   CBC Recent Labs  Lab 10/22/18 0433  WBC 15.3*  HGB 8.7*  HCT 29.4*  PLT 220    Chemistries  Recent Labs  Lab 10/20/18 0517  NA 144  K 4.3  CL 125*  CO2 19*  GLUCOSE 127*  BUN 20  CREATININE 1.12*  CALCIUM 7.2*  MG 2.1     Microbiology Results  Results for orders placed or performed in visit on 10/10/18  C difficile quick screen w PCR reflex     Status: Abnormal   Collection Time: 10/10/18  2:41 PM  Result Value Ref Range Status   C Diff antigen POSITIVE (A) NEGATIVE Final   C Diff toxin POSITIVE (A) NEGATIVE Final   C Diff interpretation Toxin producing C. difficile detected.  Final    Comment: CRITICAL RESULT CALLED TO, READ BACK BY AND VERIFIED WITH: DR. Mike Gip AT 9735 10/10/18.PMH Performed at [facility], [address], [place], [place] [address]     RADIOLOGY:  No results found.   Management plans discussed with the patient, family and they are in agreement.  CODE STATUS: DNR   TOTAL TIME TAKING CARE OF THIS PATIENT: 33 minutes.    Demetrios Loll M.D on 10/23/2018 at 12:05 PM  Between 7am to 6pm - Pager - [phone]  After 6pm go to [email or web address] - Proofreader  [facility]  Office  [phone]  CC: Primary care physician; Pleas Koch, NP   Note: This dictation was prepared with Dragon dictation along with smaller phrase technology. Any transcriptional errors that result from this process are unintentional.

## 2018-10-23 NOTE — NC FL2 (Addendum)
[place] LEVEL OF CARE SCREENING TOOL     IDENTIFICATION  Patient Name: Virginia Murray Birthdate: 31-Jul-1941 Sex: female Admission Date (Current Location): 10/11/2018  [place] and [place] Number:  Engineering geologist and Address:  [facility], [address] [place], [place] [address]      Provider Number: [id number]  Attending Physician Name and Address:  Demetrios Loll, MD  Relative Name and Phone Number:  Francena Hanly- daughter [phone]    Current Level of Care: Hospital Recommended Level of Care: [place] Prior Approval Number:    Date Approved/Denied:   PASRR Number: [id number]  Discharge Plan: SNF    Current Diagnoses: Patient Active Problem List   Diagnosis Date Noted  . Grief 10/17/2018  . Palliative care encounter   . Hypotension due to hypovolemia   . Depression   . Elevated INR   . Hypomagnesemia 10/13/2018  . Generalized abdominal pain   . C. difficile colitis 10/11/2018  . Diarrhea 10/10/2018  . Nocturnal leg cramps 10/07/2018  . Atrial fibrillation ([place]) 09/26/2018  . Long term (current) use of anticoagulants 09/26/2018  . Hyperlipidemia 09/26/2018  . Constipation due to opioid therapy 09/26/2018  . Generalized anxiety disorder   . Agitation   . Vulvar cancer ([place]) 09/03/2018  . Goals of care, counseling/discussion 09/03/2018    Orientation RESPIRATION BLADDER Height & Weight     Self, Time, Place  Normal Continent Weight: 151 lb 8 oz (68.7 kg) Height:     BEHAVIORAL SYMPTOMS/MOOD NEUROLOGICAL BOWEL NUTRITION STATUS  (none) (none) Continent Diet(Regular Diet )  AMBULATORY STATUS COMMUNICATION OF NEEDS Skin   Extensive Assist Verbally Normal                       Personal Care Assistance Level of Assistance  Bathing, Feeding, Dressing Bathing Assistance: Limited assistance Feeding assistance: Independent Dressing Assistance: Limited assistance     Functional  Limitations Info  Sight, Hearing, Speech Sight Info: Adequate Hearing Info: Adequate Speech Info: Adequate    SPECIAL CARE FACTORS FREQUENCY  PT (By licensed PT), OT (By licensed OT)     PT Frequency: 5 OT Frequency: 5            Contractures Contractures Info: Not present    Additional Factors Info  Code Status, Allergies Code Status Info: DNR Allergies Info: NKA           Current Medications (10/23/2018):  This is the current hospital active medication list Current Facility-Administered Medications  Medication Dose Route Frequency Provider Last Rate Last Dose  . acetaminophen (TYLENOL) tablet 650 mg  650 mg Oral Q6H PRN Sela Hua, MD   650 mg at 10/17/18 2130   Or  . acetaminophen (TYLENOL) suppository 650 mg  650 mg Rectal Q6H PRN Mayo, Pete Pelt, MD      . ALPRAZolam Duanne Moron) tablet 0.25 mg  0.25 mg Oral BID PRN Saundra Shelling, MD   0.25 mg at 10/18/18 2358  . digoxin (LANOXIN) tablet 0.125 mg  0.125 mg Oral Daily Gollan, Kathlene November, MD      . feeding supplement (BOOST / RESOURCE BREEZE) liquid 1 Container  1 Container Oral TID BM Demetrios Loll, MD   1 Container at 10/21/18 1445  . fentaNYL (DURAGESIC - dosed mcg/hr) patch 25 mcg  25 mcg Transdermal Q72H Sela Hua, MD   25 mcg at 10/22/18 0859  . gabapentin (NEURONTIN) capsule 300 mg  300 mg Oral QHS  Mayo, Pete Pelt, MD   300 mg at 10/22/18 2152  . Gerhardt's butt cream   Topical TID Epifanio Lesches, MD      . HYDROmorphone (DILAUDID) injection 0.5 mg  0.5 mg Intravenous Q4H PRN Mayo, Pete Pelt, MD   0.5 mg at 10/15/18 0843  . metoprolol tartrate (LOPRESSOR) tablet 12.5 mg  12.5 mg Oral Q6H Visser, Jacquelyn D, PA-C   12.5 mg at 10/23/18 0620  . midodrine (PROAMATINE) tablet 5 mg  5 mg Oral TID WC Demetrios Loll, MD   5 mg at 10/22/18 1733  . mirtazapine (REMERON) tablet 7.5 mg  7.5 mg Oral QHS Earlie Server, MD   7.5 mg at 10/22/18 2152  . multivitamin with minerals tablet 1 tablet  1 tablet Oral Daily Epifanio Lesches, MD   1 tablet at 10/22/18 0856  . ondansetron (ZOFRAN) tablet 4 mg  4 mg Oral Q6H PRN Mayo, Pete Pelt, MD       Or  . ondansetron [facility]) injection 4 mg  4 mg Intravenous Q6H PRN Mayo, Pete Pelt, MD      . oxyCODONE (Oxy IR/ROXICODONE) immediate release tablet 5 mg  5 mg Oral Q4H PRN Mayo, Pete Pelt, MD   5 mg at 10/19/18 1354  . pravastatin (PRAVACHOL) tablet 40 mg  40 mg Oral Daily Mayo, Pete Pelt, MD   40 mg at 10/22/18 0857  . prochlorperazine (COMPAZINE) tablet 10 mg  10 mg Oral Q6H PRN Mayo, Pete Pelt, MD   10 mg at 10/18/18 0944  . sertraline (ZOLOFT) tablet 25 mg  25 mg Oral Daily Demetrios Loll, MD   25 mg at 10/22/18 0857  . sodium chloride flush (NS) 0.9 % injection 10-40 mL  10-40 mL Intracatheter PRN Demetrios Loll, MD      . vitamin B-12 (CYANOCOBALAMIN) tablet 1,000 mcg  1,000 mcg Oral Daily Mayo, Pete Pelt, MD   1,000 mcg at 10/22/18 0857  . Warfarin - Pharmacist Dosing Inpatient   Does not apply [place], [place], [facility]         Discharge Medications: Please see discharge summary for a list of discharge medications.  Relevant Imaging Results:  Relevant Lab Results:   Additional Information SSN: [id number]  Annamaria Boots, [place]

## 2018-10-23 NOTE — Clinical Social Work Note (Signed)
CSW spoke with patient's daughter Francena Hanly [phone] and gave her bed offers. Daughter chose [facility] and patient is in agreement. CSW notified Claiborne Billings at [facility] of bed acceptance. Patient is medically ready for discharge today and she can admit to [facility] today. Patient will be transported by EMS. RN to call report and call for transport.   [place], [place]

## 2018-10-23 NOTE — Progress Notes (Signed)
911 called for nonemergency transport to [facility]; pt's discharge pending arrival of EMS

## 2018-10-23 NOTE — Progress Notes (Addendum)
Progress Note  Patient Name: Virginia Murray Date of Encounter: 10/23/2018  Primary Cardiologist: [facility] - Dr. Fletcher Anon  Subjective   Patient denies cardiac sx including CP, palpitations, racing HR.  She does report fatigue and a poor night's sleep. She also reports a swollen left arm.  Started on digoxin yesterday with improvement in ventricular rates from 100s down to 70s-low 90s. BP slightly improved with SBP 89-144 over last 24h.   BMET ordered yesterday as no BMET for several days. Order was cancelled for reason "discharged." Will reorder today given change in medications, comorbidities, and in the setting of infection /sepsis. Patient reportedly will be discharged to SNF. Plan to titrate metoprolol for optimal BP and HR control and recheck labs with BMET to assess renal function and electrolytes.  Inpatient Medications    Scheduled Meds: . digoxin  0.125 mg Oral Daily  . feeding supplement  1 Container Oral TID BM  . fentaNYL  25 mcg Transdermal Q72H  . gabapentin  300 mg Oral QHS  . Gerhardt's butt cream   Topical TID  . metoprolol tartrate  12.5 mg Oral Q6H  . midodrine  5 mg Oral TID WC  . mirtazapine  7.5 mg Oral QHS  . multivitamin with minerals  1 tablet Oral Daily  . pravastatin  40 mg Oral Daily  . sertraline  25 mg Oral Daily  . cyanocobalamin  1,000 mcg Oral Daily  . Warfarin - Pharmacist Dosing Inpatient   Does not apply q1800   Continuous Infusions:  PRN Meds: acetaminophen **OR** acetaminophen, ALPRAZolam, HYDROmorphone (DILAUDID) injection, ondansetron **OR** ondansetron (ZOFRAN) IV, oxyCODONE, prochlorperazine, sodium chloride flush   Vital Signs    Vitals:   10/23/18 0008 10/23/18 0343 10/23/18 0618 10/23/18 0700  BP: (!) 133/51 (!) 114/55 (!) 107/57   Pulse:  75 92   Resp:   18   Temp:   98 F (36.7 C)   TempSrc:   Oral   SpO2:   99%   Weight:    68.7 kg   No intake or output data in the 24 hours ending 10/23/18 0759 Filed Weights   10/21/18 0418 10/23/18 0700  Weight: 67.5 kg 68.7 kg    Telemetry    IRIR, Afib, ventricular rates improved 70-low 90s - Personally Reviewed  Physical Exam   GEN: No acute distress. Frail, elderly woman.  Neck: No JVD Cardiac:  Irregularly irregular, tachycardic. no murmurs, rubs, or gallops.  Respiratory: Reduced breath sounds bilaterally GI: Soft, nontender, non-distended  MS: Trace bilateral LEE; No deformity. Ecchymosis along arm and worse L>R Neuro:  Nonfocal  Psych: Normal response and affect  Labs    Chemistry Recent Labs  Lab 10/19/18 0632 10/20/18 0517  NA 146* 144  K 4.5 4.3  CL 125* 125*  CO2 16* 19*  GLUCOSE 129* 127*  BUN 18 20  CREATININE 1.00 1.12*  CALCIUM 7.1* 7.2*  GFRNONAA 54* 47*  GFRAA >60 54*  ANIONGAP 5 0*     Hematology Recent Labs  Lab 10/19/18 7782 10/20/18 0517 10/22/18 0433  WBC 16.3* 16.2* 15.3*  RBC 4.21 3.79* 3.36*  HGB 11.0* 9.7* 8.7*  HCT 37.8 32.6* 29.4*  MCV 89.8 86.0 87.5  MCH 26.1 25.6* 25.9*  MCHC 29.1* 29.8* 29.6*  RDW 17.5* 18.0* 17.7*  PLT 256 268 220    Cardiac EnzymesNo results for input(s): TROPONINI in the last 168 hours. No results for input(s): TROPIPOC in the last 168 hours.   BNPNo results for input(s):  BNP, PROBNP in the last 168 hours.   DDimer No results for input(s): DDIMER in the last 168 hours.   Radiology    No results found.  Cardiac Studies   Echocardiogram in September 2019 showed normal LV systolic function and no significant valvular abnormalities  Patient Profile     77 y.o. female with history of paroxysmal atrial fibrillation on long-term anticoagulation with warfarin, recurrent vulvar cancer, hypertension and hyperlipidemia who presented with C. difficile colitis and dehydration and developed atrial fibrillation with rapid ventricular response.  Assessment & Plan    1.  Paroxysmal atrial fibrillation with rapid ventricular response:   - CHA2DS2VASc score of at least 4 (HTN,  agex2, female). Continue warfarin with CBC to monitor hematocrit. - Elevated ventricular rates are improved from yesterday with addition of digoxin. Low BP improved from yesterday but labile with SBP 89-144 over last 24h. Consider labile BP d/t side effect profile of xanax, remeron vs. Midodrine - Ordered BMET. Will need to assess renal function and electrolytes prior to discharge.  - Currently on metoprolol 12.5mg  q6 with hold paramaters for SBP <85 or symptoms of near syncope. Several doses held over last 48h d/t low BP. Recommend transition from short acting metoprolol to longer acting toprol xl 25mg  daily with start of this medication today & recheck of vitals to ensure stable HR and BP. Continue digoxin 0.125 po qd with plan for follow-up outpatient labs to check a digoxin level. Recommend weaning off midodrine. - Will need outpatient visit with [facility] scheduled for discharge for follow-up and labs.  2.  Hypotension on mild acute renal failure: - No BMET for last several days. Ordered BMET yesterday but cancelled for reason "patient discharged or expired." Will reorder BMET for today.  - BP labile as above.  - Continue to monitor   3.  C. difficile colitis:  - On oral vancomycin.   4.  Recurrent vulvar cancer:  - Not a candidate for repeat surgery and overall prognosis seems to be poor.     For questions or updates, please contact [place] Please consult [email or web address] for contact info under [facility].       Signed, Arvil Chaco, PA-C  10/23/2018, 7:59 AM

## 2018-10-24 ENCOUNTER — Inpatient Hospital Stay: Payer: Medicare Other

## 2018-10-24 ENCOUNTER — Inpatient Hospital Stay: Payer: Medicare Other | Admitting: Oncology

## 2018-10-28 ENCOUNTER — Ambulatory Visit: Payer: Medicare Other | Admitting: Primary Care

## 2018-10-28 DIAGNOSIS — Z0289 Encounter for other administrative examinations: Secondary | ICD-10-CM

## 2018-10-31 ENCOUNTER — Other Ambulatory Visit: Payer: Self-pay

## 2018-10-31 ENCOUNTER — Encounter: Payer: Self-pay | Admitting: Oncology

## 2018-10-31 ENCOUNTER — Inpatient Hospital Stay: Payer: Medicare Other | Attending: Oncology

## 2018-10-31 ENCOUNTER — Inpatient Hospital Stay (HOSPITAL_BASED_OUTPATIENT_CLINIC_OR_DEPARTMENT_OTHER): Payer: Medicare Other | Admitting: Oncology

## 2018-10-31 VITALS — BP 97/58 | HR 80 | Temp 98.7°F | Resp 18

## 2018-10-31 DIAGNOSIS — A0472 Enterocolitis due to Clostridium difficile, not specified as recurrent: Secondary | ICD-10-CM

## 2018-10-31 DIAGNOSIS — C519 Malignant neoplasm of vulva, unspecified: Secondary | ICD-10-CM | POA: Diagnosis not present

## 2018-10-31 DIAGNOSIS — G893 Neoplasm related pain (acute) (chronic): Secondary | ICD-10-CM | POA: Diagnosis not present

## 2018-10-31 DIAGNOSIS — Z79899 Other long term (current) drug therapy: Secondary | ICD-10-CM | POA: Diagnosis not present

## 2018-10-31 DIAGNOSIS — E43 Unspecified severe protein-calorie malnutrition: Secondary | ICD-10-CM

## 2018-10-31 DIAGNOSIS — C785 Secondary malignant neoplasm of large intestine and rectum: Secondary | ICD-10-CM | POA: Diagnosis not present

## 2018-10-31 DIAGNOSIS — Z9221 Personal history of antineoplastic chemotherapy: Secondary | ICD-10-CM | POA: Insufficient documentation

## 2018-10-31 DIAGNOSIS — Z87891 Personal history of nicotine dependence: Secondary | ICD-10-CM | POA: Diagnosis not present

## 2018-10-31 DIAGNOSIS — I480 Paroxysmal atrial fibrillation: Secondary | ICD-10-CM | POA: Insufficient documentation

## 2018-10-31 DIAGNOSIS — C7982 Secondary malignant neoplasm of genital organs: Secondary | ICD-10-CM | POA: Insufficient documentation

## 2018-10-31 DIAGNOSIS — Z923 Personal history of irradiation: Secondary | ICD-10-CM

## 2018-10-31 DIAGNOSIS — R530 Neoplastic (malignant) related fatigue: Secondary | ICD-10-CM

## 2018-10-31 DIAGNOSIS — Z7901 Long term (current) use of anticoagulants: Secondary | ICD-10-CM | POA: Diagnosis not present

## 2018-10-31 DIAGNOSIS — Z7189 Other specified counseling: Secondary | ICD-10-CM

## 2018-10-31 MED ORDER — DRONABINOL 5 MG PO CAPS: 5.0000 mg | ORAL_CAPSULE | Freq: Two times a day (BID) | ORAL | 0 refills | Status: AC

## 2018-10-31 MED ORDER — DRONABINOL 5 MG PO CAPS: 5.0000 mg | ORAL_CAPSULE | Freq: Two times a day (BID) | ORAL | 0 refills | Status: DC

## 2018-10-31 NOTE — Progress Notes (Signed)
Patient here for follow up. She is currently at [facility]. Per daughter, pt has been confused. Pt has swelling to both legs.

## 2018-10-31 NOTE — Progress Notes (Addendum)
Hematology/Oncology follow up  note [facility] Telephone:([phone]) [phone] [phone]) [phone]   Patient Care Team: Pleas Koch, NP as PCP - General (Internal Medicine) Clent Jacks, RN as Registered Nurse Earlie Server, MD as Medical Oncologist (Medical Oncology) Gillis Ends, MD as Consulting Physician (Gynecologic Oncology)  REFERRING PROVIDER: [facility].  REASON FOR VISIT Follow up for treatment of vulvar cancer  HISTORY OF PRESENTING ILLNESS:  Virginia Murray is a  77 y.o.  female with PMH listed below who was referred to me for evaluation of vulvar cancer.  Patient recently relocated from [place] to [place]. Reviewed medical records sent to [place] from [place] cancer in the blood specialist Dr. Marney Doctor, who is patient's previous oncologist. 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  She completed radiation to vulva and the right groin in August 2007 at [facility].  Since then she was on surveillance.  She was advised to have follow-up but she had to postpone as she was taking care of her husband was quite ill and unfortunately passed away On 18-Aug-2018, patient was found to have a 7 cm ulcerated lesion in the left vulva partially involving the vaginal and rectum on examination.  The lesion was biopsied by Dr. Tessa Lerner on 07/29/2018, PET scan showed ulcerated perineal mass 8.7 x 4.2 x 5.3 centimeters, SUV 12, extending into the region of gluteal cleft.  Left inguinal lymph nodes measured [1.1 x 0.7 cm]  SUV 1.6.  Which appeared nonspecific possibly metastatic or reactive. There was no evidence of distant metastasis. Given prior radiation treatment, she was advised by Dr. Tessa Lerner for chemotherapy.  She met Dr.Christi Maudie Mercury for discussion of treatment options.  She  was recommended to start dose reduced carboplatin and Taxol.  Also Nex[employer] sequencing PDL 1 testing was discussed, unknown if testing were sent.  Patient did not start treatment as she is relocating to [place] to live with patient's son and wants to establish care with [facility] cancer center.  Today patient reports moderate discomfort and pain in the vulvar area.  She also has some vaginal discharge and wear pads. Reports bleeding from her private area.  Denies any symptoms of voiding trouble, dysuria or incontinence. Reports constipation.  Past medical history includes paroxysmal atrial fibrillation and take coumadin 74m daily. She has not established care with PCP locally. No INR has been checked recently.   [facility] [place] [facility] [place] [facility] BUN 21.3, creatinine 1.81, sodium 143, potassium 4, chloride 103, calcium 9.4, total protein 6.9, albumin 3.4, alkaline phosphatase 99 total bili 8.3, ALT 13 AST 18, iron 36, TIBC 241, iron saturation 14.9, magnesium 1.5, phosphorus 3.3, LDH 166, uric acid 5.8.  Folate 18.9, ferritin 84, B12 362, CEA 1.6. 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 The specimen biopsy only represents the superficial part of the lesion and focal invasion cannot be excluded. We have not received pathology slides to be evaluated by our pathologist. PET scan 07/29/2018 showed ulcerated perineal mass with known recurrent vulvar carcinoma.  Involving the left greater than right with posterior extension into the region of gluteal cleft approximately 8.7 x 4.2 x 5.3 cm.  SUV 12.  There is also minimally FDG avid subcentimeter left inguinal lymph nodes, nonspecific.  Metastatic versus reactive. We also received gynecology oncology operative  note in September 2014.  Patient had vulvar biopsy x3 and a perirectal biopsy x1 at that time.  Pathology results not available to [place].   # Discussed with patient and her granddaughter who accompanied patient to today's visit that patient has disease recurrence with involvement of the vaginal and the rectum.  Discussed with GYN oncology Dr. Theora Gianotti.  Surgery is not an option due to her poor performance status.  Exact duration will be palliative only. We agree with the plan for chemotherapy with dose reduced carboplatin and Taxol. Avastin is not a good option for her given bowel involvement and a high risk for fistulization.  # It is unknown whether PDL 1 marker and [place] status was being sent from previous oncologist office or not. Her recent biopsy and also facility was sent for NGS, however sample was not sufficient for testing.    INTERVAL HISTORY Virginia Murray is a 77 y.o. female who has above history reviewed by me today presents for follow-up.  #Patient recently discharged from a prolonged hospitalization due to C. difficile colitis/hypotension.  She currently in rehab facility.  Per patient's family, patient is not able to participate in any rehab.  She feels weak.  Lack of appetite and continue to have weight loss.  Today she refused standing up for an accurate weight measurement due to weakness. Family also reports patient sometimes is confused.  Not able to get labs from peripheral veins. Patient's blood pressure in the clinic is 97/58, which is at her recent baselines. Patient's Coumadin was held during her admission and she was sent out to rehab without Coumadin due to being persistently supra therapeutic. Also reports bilateral lower extremity swelling, denies any calf tenderness.  Review of Systems  Constitutional: Positive for malaise/fatigue and weight loss. Negative for chills and fever.  HENT: Negative for nosebleeds and sore throat.   Eyes: Negative for double vision,  photophobia and redness.  Respiratory: Negative for cough, shortness of breath and wheezing.   Cardiovascular: Negative for chest pain, palpitations and orthopnea.  Gastrointestinal: Negative for abdominal pain, blood in stool, constipation, nausea and vomiting.  Genitourinary: Negative for dysuria.       Soreness/pain around Vulva mass, spotting blood.  Musculoskeletal: Negative for back pain, myalgias and neck pain.  Skin: Negative for itching and rash.  Neurological: Negative for dizziness, tingling and tremors.       Confusion  Endo/Heme/Allergies: Negative for environmental allergies. Does not bruise/bleed easily.  Psychiatric/Behavioral: Negative for depression. The patient is not nervous/anxious.     MEDICAL HISTORY:  Past Medical History:  Diagnosis Date  . A-fib ([place])   . Cancer (HCC)    vulva   . Depression   . Diverticulitis   . Hyperlipemia   . Hypertension   . Vulva cancer [facility])     SURGICAL HISTORY: Past Surgical History:  Procedure Laterality Date  . ABDOMINAL HYSTERECTOMY    . CATARACT EXTRACTION, BILATERAL    . KNEE SURGERY     right knee   . PORTA CATH INSERTION N/A 09/04/2018   Procedure: PORTA CATH INSERTION;  Surgeon: Algernon Huxley, MD;  Location: [facility];  Service: Cardiovascular;  Laterality: N/A;  . TEE WITHOUT CARDIOVERSION N/A 09/16/2018   Procedure: TRANSESOPHAGEAL ECHOCARDIOGRAM (TEE);  Surgeon: Nelva Bush, MD;  Location: [facility] ORS;  Service: Cardiovascular;  Laterality: N/A;  .  TONSILLECTOMY    . VULVA SURGERY      SOCIAL HISTORY: Social History   Socioeconomic History  . Marital status: Widowed    Spouse name: Not on file  . Number of children: 2  . Years of education: Not on file  . Highest education level: Not on file  Occupational History  . Not on file  Social Needs  . Financial resource strain: Not on file  . Food insecurity:    Worry: Not on file    Inability: Not on file  . Transportation needs:    Medical:  Not on file    Non-medical: Not on file  Tobacco Use  . Smoking status: Former Smoker    Packs/day: 1.00    Years: 20.00    Pack years: 20.00    Types: Cigarettes    Last attempt to quit: 08/29/1993    Years since quitting: 25.1  . Smokeless tobacco: Never Used  Substance and Sexual Activity  . Alcohol use: Not Currently  . Drug use: Never  . Sexual activity: Not Currently  Lifestyle  . Physical activity:    Days per week: Not on file    Minutes per session: Not on file  . Stress: Not on file  Relationships  . Social connections:    Talks on phone: Not on file    Gets together: Not on file    Attends religious service: Not on file    Active member of club or organization: Not on file    Attends meetings of clubs or organizations: Not on file    Relationship status: Not on file  . Intimate partner violence:    Fear of current or ex partner: Not on file    Emotionally abused: Not on file    Physically abused: Not on file    Forced sexual activity: Not on file  Other Topics Concern  . Not on file  Social History Narrative  . Not on file    FAMILY HISTORY: Family History  Problem Relation Age of Onset  . Alzheimer's disease Mother   . Heart disease Father     ALLERGIES:  has No Known Allergies.  MEDICATIONS:  Current Outpatient Medications  Medication Sig Dispense Refill  . dexamethasone (DECADRON) 4 MG tablet Take 2 tablets (8 mg total) by mouth daily. Start the day after chemotherapy for 2 days. 30 tablet 1  . digoxin (LANOXIN) 0.125 MG tablet Take 1 tablet (0.125 mg total) by mouth daily. 30 tablet 0  . fentaNYL (DURAGESIC - DOSED MCG/HR) 25 MCG/HR patch Place 1 patch (25 mcg total) onto the skin every 3 (three) days. 3 patch 0  . gabapentin (NEURONTIN) 300 MG capsule Take 1 capsule (300 mg total) by mouth at bedtime. For leg cramps 30 capsule 2  . lidocaine-prilocaine (EMLA) cream Apply to affected area once 30 g 3  . metoprolol succinate (TOPROL-XL) 25 MG 24 hr  tablet Take 1 tablet (25 mg total) by mouth daily.    . midodrine (PROAMATINE) 5 MG tablet Take 1 tablet (5 mg total) by mouth 3 (three) times daily with meals. 90 tablet 0  . ondansetron (ZOFRAN) 8 MG tablet Take 1 tablet (8 mg total) by mouth 2 (two) times daily as needed for refractory nausea / vomiting. Start on day 3 after chemo. 30 tablet 1  . oxyCODONE (ROXICODONE) 5 MG immediate release tablet Take 1 tablet (5 mg total) by mouth every 4 (four) hours as needed for moderate pain, severe pain  or breakthrough pain. 12 tablet 0  . polyethylene glycol (MIRALAX) packet Take 17 g by mouth daily as needed for mild constipation or moderate constipation. 30 each 0  . pravastatin (PRAVACHOL) 40 MG tablet Take 40 mg by mouth daily.    . prochlorperazine (COMPAZINE) 10 MG tablet Take 1 tablet (10 mg total) by mouth every 6 (six) hours as needed (Nausea or vomiting). 30 tablet 1  . sertraline (ZOLOFT) 25 MG tablet Take 1 tablet (25 mg total) by mouth daily. 30 tablet 1  . vitamin B-12 1000 MCG tablet Take 1 tablet (1,000 mcg total) by mouth daily. 120 tablet 0  . dronabinol (MARINOL) 5 MG capsule Take 1 capsule (5 mg total) by mouth 2 (two) times daily before a meal. 60 capsule 0   No current facility-administered medications for this visit.      PHYSICAL EXAMINATION: ECOG PERFORMANCE STATUS: 3 - Symptomatic, >50% confined to bed Vitals:   10/31/18 1019  BP: (!) 97/58  Pulse: 80  Resp: 18  Temp: 98.7 F (37.1 C)   Filed Weights    Physical Exam  Constitutional: She is oriented to person, place, and time. No distress.  Frail appearance elderly female, cachectic  HENT:  Head: Normocephalic and atraumatic.  Mouth/Throat: Oropharynx is clear and moist.  Eyes: Pupils are equal, round, and reactive to light. EOM are normal. No scleral icterus.  Neck: Normal range of motion. Neck supple.  Cardiovascular: Normal rate, regular rhythm and normal heart sounds.  Pulmonary/Chest: Effort normal. No  respiratory distress. She has no wheezes.  Abdominal: Soft. Bowel sounds are normal. She exhibits no distension and no mass. There is no tenderness.  Genitourinary: Vaginal discharge found.  Genitourinary Comments:  Left vulvar ulcerated mass extending to the vangina,   Musculoskeletal: Normal range of motion. She exhibits edema. She exhibits no deformity.  Neurological: She is alert and oriented to person, place, and time. No cranial nerve deficit. Coordination normal.  Skin: Skin is warm and dry. No rash noted. No erythema.  Psychiatric: She has a normal mood and affect. Her behavior is normal. Thought content normal.     LABORATORY DATA:  I have reviewed the data as listed Lab Results  Component Value Date   WBC 15.3 (H) 10/22/2018   HGB 8.7 (L) 10/22/2018   HCT 29.4 (L) 10/22/2018   MCV 87.5 10/22/2018   PLT 220 10/22/2018   Recent Labs    10/07/18 1021 10/10/18 1217 10/11/18 1413  10/19/18 0632 10/20/18 0517 10/23/18 1240  NA 135 136 140   < > 146* 144 143  K 4.5 5.0 4.6   < > 4.5 4.3 4.5  CL 102 105 113*   < > 125* 125* 119*  CO2 _0 < > 16* 19* 20*  GLUCOSE 186* 158* 97   < > 129* 127* 110*  BUN 30* 47* 30*   < > _1 CREATININE 0.96 0.95 0.70   < > 1.00 1.12* 0.98  CALCIUM 8.6* 8.9 8.1*   < > 7.1* 7.2* 7.5*  GFRNONAA 57* 57* >60   < > 54* 47* 55*  GFRAA >60 >60 >60   < > >60 54* >60  PROT 6.2* 5.9* 5.1*  --   --   --   --   ALBUMIN 2.7* 2.6* 2.2*  --   --   --   --   AST _2 --   --   --   --  ALT _0 --   --   --   --   ALKPHOS 59 61 54  --   --   --   --   BILITOT 1.0 0.4 0.5  --   --   --   --    < > = values in this interval not displayed.   Iron/TIBC/Ferritin/ %Sat    Component Value Date/Time   IRON 19 (L) 09/13/2018 1725   TIBC 196 (L) 09/13/2018 1725   FERRITIN 66 09/13/2018 1725   IRONPCTSAT 10 (L) 09/13/2018 1725     RADIOGRAPHIC STUDIES: I have personally reviewed the radiological images as listed and agreed with  the findings in the report. CT head wo contrast 09/29/2018  IMPRESSION: Stable atrophy pattern and chronic white matter microvascular ischemic changes. No acute intracranial abnormality by noncontrast CT.  ASSESSMENT & PLAN:  1. Vulvar cancer ([place])   2. Neoplasm related pain   3. Neoplastic malignant related fatigue   4. C. difficile colitis   5. Goals of care, counseling/discussion   6. Severe protein-calorie malnutrition ([place])    #Locally advanced vulvar cancer, status post 1 cycle of first-line chemotherapy regimen with [place] and Taxol. She did not tolerate very well.  She had a prolonged hospitalization due to C. difficile colitis. Her overall performance status continues to decline rapidly.  Multiple discussion during hospitalization for hospice and patient not interested. Today patient is accompanied by daughter in law and granddaughter. Patient appears to be more open to hospice care in the future.  She would like to consider it and update me. She understands that due to her poor performance, continuing treatments will do more harm than benefit.  Diarrhea has resolved. Neoplasm related pain, reports pain is relatively well controlled using current pain regimen. She has been persistently supra therapeutic INR, due to malnutrition and antibiotic anticoagulation has been held currently at rehab facility.  If she remains stable, consider switch to DOACs.  Per cardiology Borderline blood pressure, has been her recent baseline.  Follow-up with cardiology Weight loss and poor appetite, protein calorie malnutrition start Marinol 5 mg twice daily.  Referred to dietitian Discussed with patient that lower extremity swelling likely anasarca due to malnutrition.  #Goal of care, palliative intent.    We spent sufficient time to discuss many aspect of care, questions were answered to patient's satisfaction.  Return of visit: 01/10/2018   Earlie Server, MD, PhD Hematology Oncology [facility] Pager- [phone] 10/31/2018

## 2018-11-03 ENCOUNTER — Telehealth: Payer: Self-pay | Admitting: *Deleted

## 2018-11-03 NOTE — Telephone Encounter (Signed)
Recommend starting patient with Eliquis 2.5mg  BID.  Thanks. Does the facility need Rx?

## 2018-11-03 NOTE — Telephone Encounter (Signed)
Called patients living facility, [facility], to request an INR to be drawn.  Patients nurse, Tamsen Roers.,  there in the facility states that they have a machine in house and they had already checked INR today.    Results: PT:  18.3 INR: 1.5

## 2018-11-04 MED ORDER — APIXABAN 2.5 MG PO TABS: 2.5000 mg | ORAL_TABLET | Freq: Two times a day (BID) | ORAL | 3 refills | Status: AC

## 2018-11-04 NOTE — Telephone Encounter (Signed)
Rx printed and faxed to [facility]

## 2018-11-07 ENCOUNTER — Encounter: Payer: Self-pay | Admitting: Emergency Medicine

## 2018-11-07 ENCOUNTER — Emergency Department
Admission: EM | Admit: 2018-11-07 | Discharge: 2018-11-07 | Disposition: A | Discharge status: Hospice | Payer: Medicare Other | Attending: Emergency Medicine | Admitting: Emergency Medicine

## 2018-11-07 ENCOUNTER — Other Ambulatory Visit: Payer: Self-pay | Admitting: Family Medicine

## 2018-11-07 ENCOUNTER — Other Ambulatory Visit: Payer: Self-pay

## 2018-11-07 ENCOUNTER — Ambulatory Visit: Payer: Medicare Other | Admitting: Primary Care

## 2018-11-07 DIAGNOSIS — R6884 Jaw pain: Secondary | ICD-10-CM | POA: Diagnosis present

## 2018-11-07 DIAGNOSIS — I10 Essential (primary) hypertension: Secondary | ICD-10-CM | POA: Diagnosis not present

## 2018-11-07 DIAGNOSIS — C519 Malignant neoplasm of vulva, unspecified: Secondary | ICD-10-CM | POA: Diagnosis not present

## 2018-11-07 DIAGNOSIS — L089 Local infection of the skin and subcutaneous tissue, unspecified: Secondary | ICD-10-CM

## 2018-11-07 DIAGNOSIS — Z7901 Long term (current) use of anticoagulants: Secondary | ICD-10-CM | POA: Diagnosis not present

## 2018-11-07 DIAGNOSIS — Z8049 Family history of malignant neoplasm of other genital organs: Secondary | ICD-10-CM | POA: Diagnosis not present

## 2018-11-07 DIAGNOSIS — Z79899 Other long term (current) drug therapy: Secondary | ICD-10-CM | POA: Insufficient documentation

## 2018-11-07 DIAGNOSIS — Z87891 Personal history of nicotine dependence: Secondary | ICD-10-CM | POA: Insufficient documentation

## 2018-11-07 DIAGNOSIS — Z515 Encounter for palliative care: Secondary | ICD-10-CM | POA: Diagnosis not present

## 2018-11-07 MED ORDER — HYDROMORPHONE HCL 1 MG/ML IJ SOLN
0.5000 mg | INTRAMUSCULAR | Status: DC | PRN
  Administered 2018-11-07: 0.5 mg via INTRAMUSCULAR

## 2018-11-07 MED ORDER — LORAZEPAM 2 MG/ML IJ SOLN: 1.0000 mg | INTRAMUSCULAR | Status: DC | PRN

## 2018-11-07 MED ORDER — HYDROMORPHONE HCL 1 MG/ML IJ SOLN
0.5000 mg | INTRAMUSCULAR | Status: DC | PRN
  Filled 2018-11-07 (×2): qty 1

## 2018-11-07 MED ORDER — MORPHINE SULFATE 10 MG/5ML PO SOLN
10.0000 mg | ORAL | Status: DC | PRN
  Administered 2018-11-07: 10 mg via ORAL
  Filled 2018-11-07 (×3): qty 5

## 2018-11-07 NOTE — Discharge Instructions (Addendum)
Please call the hospice service for any acute medical concerns, and return to the hospital ER if needed.

## 2018-11-07 NOTE — ED Provider Notes (Signed)
[facility] Emergency Department Provider Note   ____________________________________________    I have reviewed the triage vital signs and the nursing notes.   HISTORY  Chief Complaint Jaw Pain and Tachycardia     HPI Virginia Murray is a 77 y.o. female who was sent in for evaluation of right jaw pain.  There is apparently a concerned that patient has a jaw fracture or perhaps malignancy or infection there.  Little history is provided, the patient is a very poor historian   Past Medical History:  Diagnosis Date  . A-fib ([place])   . Cancer (HCC)    vulva   . Depression   . Diverticulitis   . Hyperlipemia   . Hypertension   . Vulva cancer [facility])     Patient Active Problem List   Diagnosis Date Noted  . Grief 10/17/2018  . Palliative care encounter   . Hypotension due to hypovolemia   . Depression   . Elevated INR   . Hypomagnesemia 10/13/2018  . Generalized abdominal pain   . C. difficile colitis 10/11/2018  . Diarrhea 10/10/2018  . Nocturnal leg cramps 10/07/2018  . Atrial fibrillation ([place]) 09/26/2018  . Long term (current) use of anticoagulants 09/26/2018  . Hyperlipidemia 09/26/2018  . Constipation due to opioid therapy 09/26/2018  . Generalized anxiety disorder   . Agitation   . Vulvar cancer ([place]) 09/03/2018  . Goals of care, counseling/discussion 09/03/2018    Past Surgical History:  Procedure Laterality Date  . ABDOMINAL HYSTERECTOMY    . CATARACT EXTRACTION, BILATERAL    . KNEE SURGERY     right knee   . PORTA CATH INSERTION N/A 09/04/2018   Procedure: PORTA CATH INSERTION;  Surgeon: Algernon Huxley, MD;  Location: [place] [facility];  Service: Cardiovascular;  Laterality: N/A;  . TEE WITHOUT CARDIOVERSION N/A 09/16/2018   Procedure: TRANSESOPHAGEAL ECHOCARDIOGRAM (TEE);  Surgeon: Nelva Bush, MD;  Location: [facility] ORS;  Service: Cardiovascular;  Laterality: N/A;  . TONSILLECTOMY    . VULVA SURGERY      Prior to  Admission medications   Medication Sig Start Date End Date Taking? Authorizing Provider  apixaban (ELIQUIS) 2.5 MG TABS tablet Take 1 tablet (2.5 mg total) by mouth 2 (two) times daily. Patient taking differently: Take 2.5 mg by mouth every 12 (twelve) hours.  11/04/18  Yes Earlie Server, MD  digoxin (LANOXIN) 0.125 MG tablet Take 1 tablet (0.125 mg total) by mouth daily. 10/23/18  Yes Demetrios Loll, MD  fentaNYL (DURAGESIC - DOSED MCG/HR) 25 MCG/HR patch Place 1 patch (25 mcg total) onto the skin every 3 (three) days. Patient taking differently: Place 25 mcg onto the skin every 3 (three) days. In the evening 10/23/18  Yes Demetrios Loll, MD  gabapentin (NEURONTIN) 300 MG capsule Take 1 capsule (300 mg total) by mouth at bedtime. For leg cramps Patient taking differently: Take 300 mg by mouth at bedtime.  10/07/18  Yes Verlon Au, NP  lidocaine-prilocaine (EMLA) cream Apply to affected area once Patient taking differently: Apply 1 application topically as needed (to port-a-cath prior to chemotherapy).  09/03/18  Yes Earlie Server, MD  Melatonin 5 MG TABS Take 5 mg by mouth at bedtime.   Yes [provider]  metoprolol succinate (TOPROL-XL) 25 MG 24 hr tablet Take 1 tablet (25 mg total) by mouth daily. 10/23/18  Yes Demetrios Loll, MD  ondansetron (ZOFRAN) 8 MG tablet Take 1 tablet (8 mg total) by mouth 2 (two) times daily  as needed for refractory nausea / vomiting. Start on day 3 after chemo. Patient taking differently: Take 8 mg by mouth every 12 (twelve) hours as needed for nausea or vomiting.  09/03/18  Yes Earlie Server, MD  oxyCODONE (ROXICODONE) 5 MG immediate release tablet Take 1 tablet (5 mg total) by mouth every 4 (four) hours as needed for moderate pain, severe pain or breakthrough pain. 10/23/18  Yes Demetrios Loll, MD  polyethylene glycol [facility]) packet Take 17 g by mouth daily as needed for mild constipation or moderate constipation. 08/29/18  Yes Earlie Server, MD  pravastatin (PRAVACHOL) 40 MG tablet Take 40  mg by mouth every evening.    Yes [provider]  senna (SENOKOT) 8.6 MG TABS tablet Take 2 tablets by mouth daily as needed for mild constipation or moderate constipation.   Yes [provider]  sertraline (ZOLOFT) 25 MG tablet Take 1 tablet (25 mg total) by mouth daily. 09/26/18  Yes Pleas Koch, NP  vitamin B-12 1000 MCG tablet Take 1 tablet (1,000 mcg total) by mouth daily. 09/17/18  Yes Mody, Ulice Bold, MD  dexamethasone (DECADRON) 4 MG tablet Take 2 tablets (8 mg total) by mouth daily. Start the day after chemotherapy for 2 days. Patient not taking: Reported on 11/07/2018 09/03/18   Earlie Server, MD  dronabinol (MARINOL) 5 MG capsule Take 1 capsule (5 mg total) by mouth 2 (two) times daily before a meal. Patient not taking: Reported on 11/07/2018 10/31/18   Earlie Server, MD  midodrine (PROAMATINE) 5 MG tablet Take 1 tablet (5 mg total) by mouth 3 (three) times daily with meals. Patient not taking: Reported on 11/07/2018 10/22/18   Demetrios Loll, MD  prochlorperazine (COMPAZINE) 10 MG tablet Take 1 tablet (10 mg total) by mouth every 6 (six) hours as needed (Nausea or vomiting). Patient not taking: Reported on 11/07/2018 09/03/18   Earlie Server, MD     Allergies Patient has no known allergies.  Family History  Problem Relation Age of Onset  . Alzheimer's disease Mother   . Heart disease Father     Social History Social History   Tobacco Use  . Smoking status: Former Smoker    Packs/day: 1.00    Years: 20.00    Pack years: 20.00    Types: Cigarettes    Last attempt to quit: 08/29/1993    Years since quitting: 25.2  . Smokeless tobacco: Never Used  Substance Use Topics  . Alcohol use: Not Currently  . Drug use: Never    Level 5 caveat: Unable to obtain accurate review of Systems due to dementia/confusion     ____________________________________________   PHYSICAL EXAM:  VITAL SIGNS: ED Triage Vitals  Enc Vitals Group     BP 11/07/18 1352 (!) 177/59     Pulse Rate  11/07/18 1352 71     Resp --      Temp 11/07/18 1352 (!) 97.4 F (36.3 C)     Temp Source 11/07/18 1352 Axillary     SpO2 11/07/18 1352 96 %     Weight 11/07/18 1343 56 kg (123 lb 7.3 oz)     Height --      Head Circumference --      Peak Flow --      Pain Score --      Pain Loc --      Pain Edu? --      Excl. in [place]? --     Constitutional: Alert  Eyes: Conjunctivae are normal.  Head: Area of erythema and swelling to the right angle of the jaw extending close to the ear into the neck which is tender, no fluctuance or discharge. Nose: No congestion/rhinnorhea. Mouth/Throat: Mucous membranes are moist.    Cardiovascular: Normal rate, regular rhythm.  Good peripheral circulation. Respiratory: Normal respiratory effort.  No retractions.  Gastrointestinal: Soft and nontender. No distention.s. Musculoskeletal:  Warm and well perfused Neurologic:  No gross focal neurologic deficits are appreciated.  Skin:  Skin is warm, dry and intact. No rash noted.   ____________________________________________   LABS (all labs ordered are listed, but only abnormal results are displayed)  Labs Reviewed - No data to display ____________________________________________  EKG  None ____________________________________________  RADIOLOGY  None ____________________________________________   PROCEDURES  Procedure(s) performed: No  Procedures   Critical Care performed: No ____________________________________________   INITIAL IMPRESSION / ASSESSMENT AND PLAN / ED COURSE  Pertinent labs & imaging results that were available during my care of the patient were reviewed by me and considered in my medical decision making (see chart for details).  Patient's daughter has arrived and has asked [place] not to do any work-up.  Apparently she was sent to the emergency department for evaluation for hospice care.  Palliative medicine consult placed  Hospice nurse evaluating, will be transferred to  hospice facility at 7 PM today    ____________________________________________   FINAL CLINICAL IMPRESSION(S) / ED DIAGNOSES  Final diagnoses:  Facial infection        Note:  This document was prepared using Dragon voice recognition software and may include unintentional dictation errors.    Lavonia Drafts, MD 11/07/18 1524

## 2018-11-07 NOTE — Progress Notes (Signed)
New hospice home referral received from Palliative NP Delia. Patient is a 77 year old woman, followed by outpatient Palliative at [facility] care. She was sent to the [facility] ED today for evaluation of a sudden painful right neck swelling. Per chart note review xray at the facility showed possible jaw fracture. No records to review in Epic for this. Patient has a known history of metastatic valvular cancer, last chemo infusion 10/4 treatment. She has 2 admission in the last 2 months, recent treatment for c-diff. Patient has had a steady decline in function, she has lost 25 lbs since admission in October 2019. Patient and family met with Palliative NP Josh Borders in the ED and have decided to focus on comfort with transfer to the hospice home. Patient seen lying on the ED stretcher, appears ill, pale and in pain. Pain addressed with Staff RN's Rush Landmark and Mateo Flow. IV unable to be started, EDP Dr. Corky Downs placed orders for oral morphine. Writer met with family, patient's daughter in law Sharyn Lull, son Laverna Peace and granddaughter Janett Billow. Education provided regarding hospice services, philosophy and team approach to care with understanding voiced. Consents signed. Updated notes faxed to referral. Report called to the hospice home EMS to be notified for a 7 pm pick up. Hospital care team updated. Thank  You for the opportunity to be involved in the care of this patient. Flo Shanks RN, BSN, [facility] [place], hospital Liaison [phone]

## 2018-11-07 NOTE — Consult Note (Signed)
[place]  Telephone:([phone] [phone]   Name: Virginia Murray Date: 11/07/2018 MRN: [id number]  DOB: 01-08-1941  Patient Care Team: Pleas Koch, NP as PCP - General (Internal Medicine) Clent Jacks, RN as Registered Nurse Earlie Server, MD as Medical Oncologist (Medical Oncology) Gillis Ends, MD as Consulting Physician (Gynecologic Oncology)    REASON FOR CONSULTATION: Palliative Care consult requested for this 77 y.o. 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  Patient is being treated on carboplatin with Taxol (last received on 10/03/2018).  PMH also notable for paroxysmal A. fib anticoagulated on warfarin, hypertension, hyperlipidemia, and depression.  She was hospitalized 09/13/2018 to 09/17/2018 with sepsis found to have blood cultures positive for group G Streptococcus.  She was hospitalized again 10/11/2018 to 10/23/2018 with C. difficile colitis.  Her hospitalization was complicated by hypertension and poor oral intake.  Patient was seen in consultation by palliative care and family opted to pursue rehab.  Patient was sent to the ER on 11/07/2018 from the rehab center due to acute onset of right neck swelling and erythema.  Reportedly imaging done at the nursing facility revealed fracture.  Palliative care was again consulted to help address goals of care.   SOCIAL HISTORY:    Patient is widowed, having lost her husband May 2019.  She moved here from [place] to live with her son.  She also has a daughter who lives in [place].  ADVANCE DIRECTIVES:  None  CODE STATUS: DNR  PAST MEDICAL HISTORY: Past Medical History:  Diagnosis Date  . A-fib ([place])   . Cancer (HCC)    vulva   . Depression   . Diverticulitis   . Hyperlipemia   . Hypertension   . Vulva  cancer ([place])     PAST SURGICAL HISTORY:  Past Surgical History:  Procedure Laterality Date  . ABDOMINAL HYSTERECTOMY    . CATARACT EXTRACTION, BILATERAL    . KNEE SURGERY     right knee   . PORTA CATH INSERTION N/A 09/04/2018   Procedure: PORTA CATH INSERTION;  Surgeon: Algernon Huxley, MD;  Location: [place] [facility] LAB;  Service: Cardiovascular;  Laterality: N/A;  . TEE WITHOUT CARDIOVERSION N/A 09/16/2018   Procedure: TRANSESOPHAGEAL ECHOCARDIOGRAM (TEE);  Surgeon: Nelva Bush, MD;  Location: [facility] ORS;  Service: Cardiovascular;  Laterality: N/A;  . TONSILLECTOMY    . VULVA SURGERY      HEMATOLOGY/ONCOLOGY HISTORY:    Vulvar cancer ([place])   09/03/2018 Initial Diagnosis    Vulvar cancer ([place])    09/03/2018 -  Chemotherapy    The patient had palonosetron (ALOXI) injection 0.25 mg, 0.25 mg, Intravenous,  Once, 1 of 5 cycles Administration: 0.25 mg (10/03/2018) CARBOplatin (PARAPLATIN) 250 mg in sodium chloride 0.9 % 100 mL chemo infusion, 250 mg (100 % of original dose 254 mg), Intravenous,  Once, 1 of 5 cycles Dose modification:   (original dose 254 mg, Cycle 1, Reason: Provider Judgment),   (original dose 254 mg, Cycle 1) Administration: 250 mg (10/03/2018) PACLitaxel (TAXOL) 198 mg in sodium chloride 0.9 % 250 mL chemo infusion (> 69m/m2), 135 mg/m2 = 198 mg (100 % of original dose 135 mg/m2), Intravenous,  Once, 1 of 5 cycles Dose modification: 135 mg/m2 (original dose 135 mg/m2, Cycle 1, Reason: Other (see comments), Comment: performance status) Administration: 198 mg (10/03/2018)  for  chemotherapy treatment.      ALLERGIES:  has No Known Allergies.  MEDICATIONS:  No current facility-administered medications for this encounter.    Current Outpatient Medications  Medication Sig Dispense Refill  . apixaban (ELIQUIS) 2.5 MG TABS tablet Take 1 tablet (2.5 mg total) by mouth 2 (two) times daily. (Patient taking differently: Take 2.5 mg by mouth every 12 (twelve) hours. ) 60 tablet  3  . digoxin (LANOXIN) 0.125 MG tablet Take 1 tablet (0.125 mg total) by mouth daily. 30 tablet 0  . fentaNYL (DURAGESIC - DOSED MCG/HR) 25 MCG/HR patch Place 1 patch (25 mcg total) onto the skin every 3 (three) days. (Patient taking differently: Place 25 mcg onto the skin every 3 (three) days. In the evening) 3 patch 0  . gabapentin (NEURONTIN) 300 MG capsule Take 1 capsule (300 mg total) by mouth at bedtime. For leg cramps (Patient taking differently: Take 300 mg by mouth at bedtime. ) 30 capsule 2  . lidocaine-prilocaine (EMLA) cream Apply to affected area once (Patient taking differently: Apply 1 application topically as needed (to port-a-cath prior to chemotherapy). ) 30 g 3  . Melatonin 5 MG TABS Take 5 mg by mouth at bedtime.    . metoprolol succinate (TOPROL-XL) 25 MG 24 hr tablet Take 1 tablet (25 mg total) by mouth daily.    . ondansetron (ZOFRAN) 8 MG tablet Take 1 tablet (8 mg total) by mouth 2 (two) times daily as needed for refractory nausea / vomiting. Start on day 3 after chemo. (Patient taking differently: Take 8 mg by mouth every 12 (twelve) hours as needed for nausea or vomiting. ) 30 tablet 1  . oxyCODONE (ROXICODONE) 5 MG immediate release tablet Take 1 tablet (5 mg total) by mouth every 4 (four) hours as needed for moderate pain, severe pain or breakthrough pain. 12 tablet 0  . polyethylene glycol (MIRALAX) packet Take 17 g by mouth daily as needed for mild constipation or moderate constipation. 30 each 0  . pravastatin (PRAVACHOL) 40 MG tablet Take 40 mg by mouth every evening.     . senna (SENOKOT) 8.6 MG TABS tablet Take 2 tablets by mouth daily as needed for mild constipation or moderate constipation.    . sertraline (ZOLOFT) 25 MG tablet Take 1 tablet (25 mg total) by mouth daily. 30 tablet 1  . vitamin B-12 1000 MCG tablet Take 1 tablet (1,000 mcg total) by mouth daily. 120 tablet 0  . dexamethasone (DECADRON) 4 MG tablet Take 2 tablets (8 mg total) by mouth daily. Start the  day after chemotherapy for 2 days. (Patient not taking: Reported on 11/07/2018) 30 tablet 1  . dronabinol (MARINOL) 5 MG capsule Take 1 capsule (5 mg total) by mouth 2 (two) times daily before a meal. (Patient not taking: Reported on 11/07/2018) 60 capsule 0  . midodrine (PROAMATINE) 5 MG tablet Take 1 tablet (5 mg total) by mouth 3 (three) times daily with meals. (Patient not taking: Reported on 11/07/2018) 90 tablet 0  . prochlorperazine (COMPAZINE) 10 MG tablet Take 1 tablet (10 mg total) by mouth every 6 (six) hours as needed (Nausea or vomiting). (Patient not taking: Reported on 11/07/2018) 30 tablet 1    VITAL SIGNS: BP (!) 177/59 (BP Location: Left Arm)   Pulse 71   Temp (!) 97.4 F (36.3 C) (Axillary)   Wt 123 lb 7.3 oz (56 kg)   SpO2 96%   BMI 21.87 kg/m  Filed Weights   11/07/18 1343  Weight:  123 lb 7.3 oz (56 kg)    Estimated body mass index is 21.87 kg/m as calculated from the following:   Height as of 09/29/18: _0  (1.6 m).   Weight as of this encounter: 123 lb 7.3 oz (56 kg).  LABS: CBC:    Component Value Date/Time   WBC 15.3 (H) 10/22/2018 0433   HGB 8.7 (L) 10/22/2018 0433   HCT 29.4 (L) 10/22/2018 0433   PLT 220 10/22/2018 0433   MCV 87.5 10/22/2018 0433   NEUTROABS 7.0 10/10/2018 1217   LYMPHSABS 0.5 (L) 10/10/2018 1217   MONOABS 0.5 10/10/2018 1217   EOSABS 0.0 10/10/2018 1217   BASOSABS 0.0 10/10/2018 1217   Comprehensive Metabolic Panel:    Component Value Date/Time   NA 143 10/23/2018 1240   K 4.5 10/23/2018 1240   CL 119 (H) 10/23/2018 1240   CO2 20 (L) 10/23/2018 1240   BUN 18 10/23/2018 1240   CREATININE 0.98 10/23/2018 1240   GLUCOSE 110 (H) 10/23/2018 1240   CALCIUM 7.5 (L) 10/23/2018 1240   AST 16 10/11/2018 1413   ALT 11 10/11/2018 1413   ALKPHOS 54 10/11/2018 1413   BILITOT 0.5 10/11/2018 1413   PROT 5.1 (L) 10/11/2018 1413   ALBUMIN 2.2 (L) 10/11/2018 1413    RADIOGRAPHIC STUDIES: Ct Abdomen Pelvis W Contrast  Result Date:  10/13/2018 CLINICAL DATA:  Locally advanced vulvar cancer. Now with diarrhea and diffuse abdominal discomfort. EXAM: CT ABDOMEN AND PELVIS WITH CONTRAST TECHNIQUE: Multidetector CT imaging of the abdomen and pelvis was performed using the standard protocol following bolus administration of intravenous contrast. CONTRAST:  31m OMNIPAQUE IOHEXOL 300 MG/ML  SOLN COMPARISON:  None. FINDINGS: Lower chest: Large hiatal hernia. Small bilateral pleural effusions. 2-3 mm pulmonary nodule noted at the right base (1/4). Hepatobiliary: No focal abnormality within the liver parenchyma. There is no evidence for gallstones, gallbladder wall thickening, or pericholecystic fluid. No intrahepatic or extrahepatic biliary dilation. Pancreas: No focal mass lesion. No dilatation of the main duct. No intraparenchymal cyst. No peripancreatic edema. Spleen: No splenomegaly. Tiny low-density subcapsular focus posterior spleen is likely a cyst or pseudocyst. Adrenals/Urinary Tract: No adrenal nodule or mass. Tiny hypoattenuating lesions in each kidney are too small to characterize. No evidence for hydroureter. Mild circumferential bladder wall thickening evident despite underdistention. Stomach/Bowel: Large hiatal hernia with nearly the entire stomach contained in the chest. Duodenum is normally positioned as is the ligament of Treitz. No small bowel wall thickening. No small bowel dilatation. Terminal ileum unremarkable. The appendix is not visualized, but there is no edema or inflammation in the region of the cecum. Right colon is stool-filled. Left colon shows irregular circumferential wall thickening, advanced in the sigmoid colon. There is associated pericolonic edema/inflammation. Prominent stool volume noted in the rectum. Vascular/Lymphatic: There is abdominal aortic atherosclerosis without aneurysm. There is no gastrohepatic or hepatoduodenal ligament lymphadenopathy. No intraperitoneal or retroperitoneal lymphadenopathy. No pelvic  sidewall lymphadenopathy. Reproductive: Uterus surgically absent.  There is no adnexal mass. Other: Small volume intraperitoneal free fluid. Musculoskeletal: No worrisome lytic or sclerotic osseous abnormality. Diffuse body wall edema evident. IMPRESSION: 1. Marked edema and irregularity of the colonic wall, most prominently in the left colon. This is associated with substantial pericolonic edema/inflammation. Imaging features are compatible with the reported history of C diff colitis. 2. Small volume intraperitoneal free fluid with diffuse body wall edema. 3. Very large hiatal hernia. 4. 2-3 mm pulmonary nodule at the right lung base. No follow-up needed if patient is low-risk. Non-contrast  chest CT can be considered in 12 months if patient is high-risk. This recommendation follows the consensus statement: Guidelines for Management of Incidental Pulmonary Nodules Detected on CT Images: From the Fleischner Society 2017; Radiology 2017; 284:228-243. 5.  Aortic Atherosclerois (ICD10-170.0) Electronically Signed   By: Misty Stanley M.D.   On: 10/13/2018 18:11    PERFORMANCE STATUS (ECOG) : 4 - Bedbound  Review of Systems As noted above. Otherwise, a complete review of systems is negative.  Physical Exam General: Ill-appearing, lying in bed HEENT: Large amount of erythema and focal tenderness to the right neck, patient unable to open her mouth enough for visual inspection Pulmonary: Unlabored Abdomen: soft, nontender Extremities: Large amount of edema lower extremities  Neurological: Alert but confused, profoundly weak, verbal but vocal quality poor  IMPRESSION: Patient known to me from her previous hospitalization.  I was called by Natalia Leatherwood, NP with palliative care, who is been following her at [facility].  Apparently patient has declined considerably since discharge from the hospital.  Family opted to send her to the ER today but requested to meet with me to discuss goals.  I  met with patient's son and daughter-in-law in the emergency department.  Family confirm decline.  They say patient has essentially become bedbound.  She is no longer able to ambulate or stand.  Oral intake has been extremely poor.  Family say the neck swelling and erythema started today.  They also say that patient had an x-ray at the facility and they were informed that there was a fracture in the jaw.  They deny any falls or trauma.  We discussed option for work-up and treatment including imaging, labs, and probable admission to the hospital.  I suspect that the erythema likely reflects an infectious process.  Patient would likely need IV antibiotics.  However, given patient's decline it would seem reasonable to consider less aggressive measures.  I discussed with family the option of comfort care and foregoing work-up or treatment.  Family verbalized clearly that they are not interested in hospitalization or treatment at this point as they only feel that would prolong patient's decline and ultimate death.  Instead, they ask about transfer to the hospice home.  Again, family clearly state their desire to keep patient comfortable and further work-up or treatment.  They recognize that patient is likely approaching end-of-life.  They want her managed at the hospice home.  I spoke with Flo Shanks, RN, hospice liaison.  There apparently is a bed open at hospice home.  We will pursue transfer from the ER.  Case discussed with Dr. Corky Downs.   PLAN: Comfort care Start hydromorphone 0.5 mg IV every 2 hours as needed, first dose now Lorazepam 1 mg IV every 4 hours as needed for agitation DNR Case discussed with hospice liaison and ED physician Will give report to hospice physician   Time Total: 60 minutes  Visit consisted of counseling and education dealing with the complex and emotionally intense issues of symptom management and palliative care in the setting of serious and potentially  life-threatening illness.Greater than 50%  of this time was spent counseling and coordinating care related to the above assessment and plan.  Signed by: Altha Harm, PhD, DNP, NP-C, [facility] [phone] (Work Cell)

## 2018-11-07 NOTE — ED Triage Notes (Signed)
Pt to ED from [facility] with a hx of cancer and concerns for mets to the right jaw. Jaw is swollen and red upon arrival. Xray of mandible is inconclusiove for a fracture per EMS. 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   Per report form [facility] care PCP verbalized concern for sepsis due to BP and tachycardia with redness to right jaw. NO fevers.

## 2018-11-10 ENCOUNTER — Inpatient Hospital Stay: Payer: Medicare Other | Admitting: Hospice and Palliative Medicine

## 2018-11-10 ENCOUNTER — Inpatient Hospital Stay: Payer: Medicare Other | Admitting: Oncology

## 2018-11-10 ENCOUNTER — Inpatient Hospital Stay: Payer: Medicare Other

## 2018-11-30 DEATH — deceased

## 2018-12-10 ENCOUNTER — Ambulatory Visit: Payer: Medicare Other

## 2020-05-14 IMAGING — CT CT HEAD W/O CM
3 series · 14 of 47 positions shown, 16 images · non-contrast
Comparison: None.

CLINICAL DATA: 76 y/o F; anxiety, agitation, possible personality
change. Rule out intracranial process. History of vulvar cancer.

EXAM:
CT HEAD WITHOUT CONTRAST
TECHNIQUE: Contiguous axial images were obtained from the base of the skull
through the vertex without intravenous contrast.

[Series 2: head wo · axial · 0.47mm/px · z∈[-101,+24]mm · 8 of 30 slices shown, 10 images]
[im 3/30  brain]
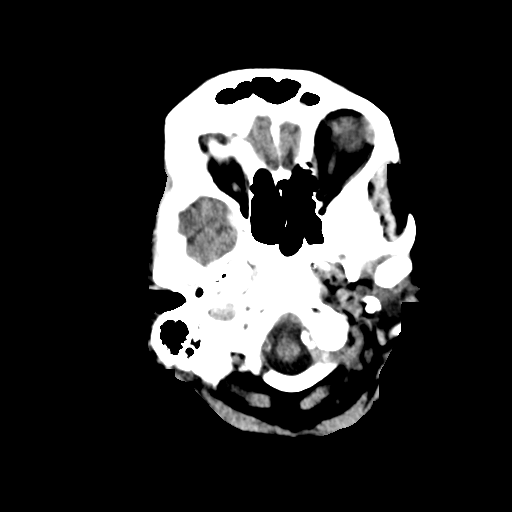
[im 3/30  bone]
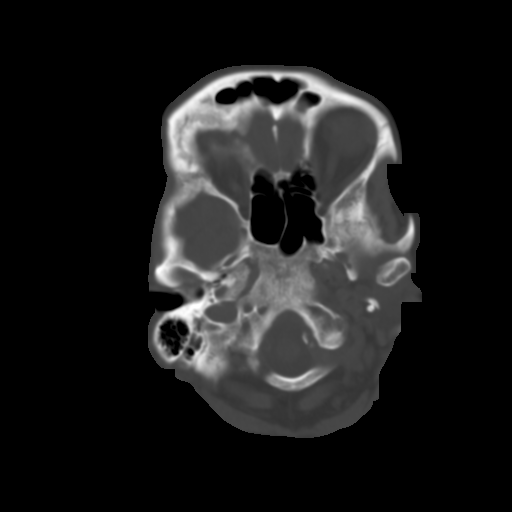
[im 7/30  brain]
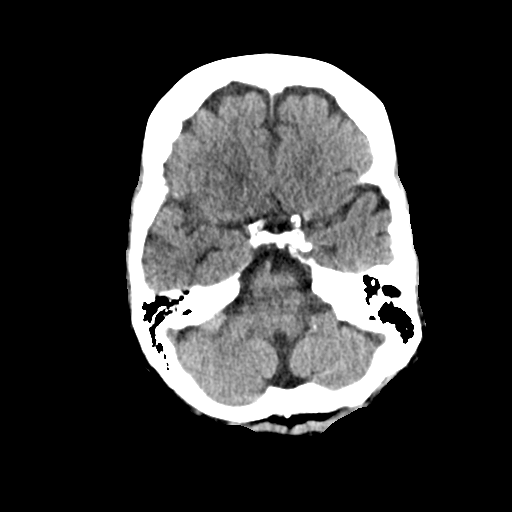
[im 10/30  brain]
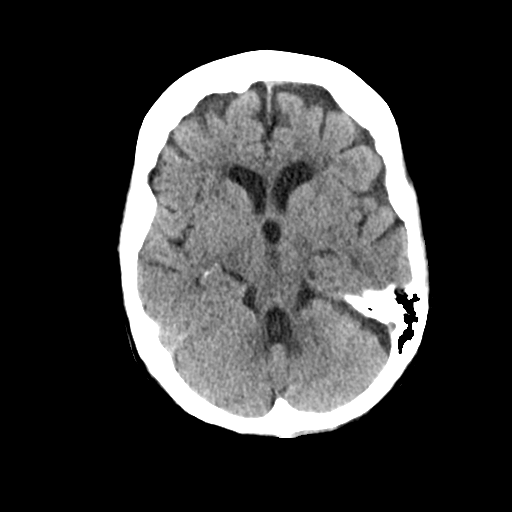
[im 14/30  brain]
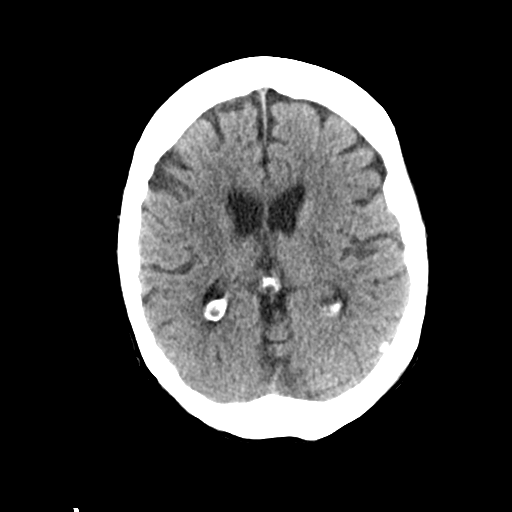
[im 17/30  brain]
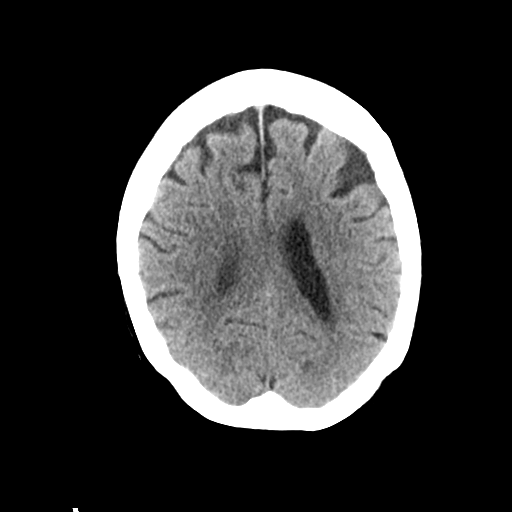
[im 17/30  bone]
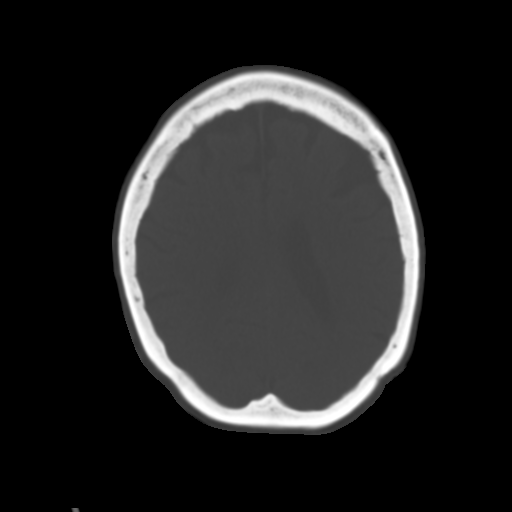
[im 21/30  brain]
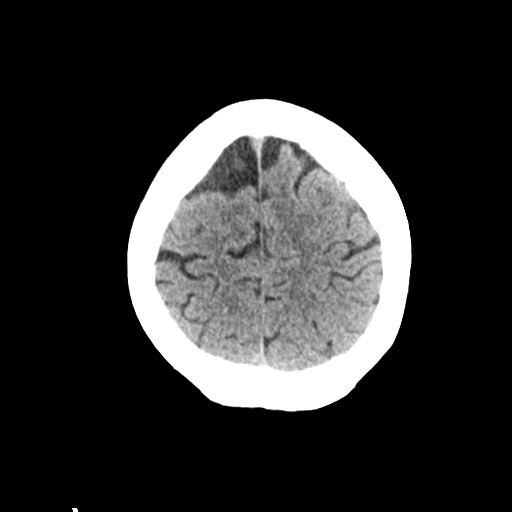
[im 24/30  brain]
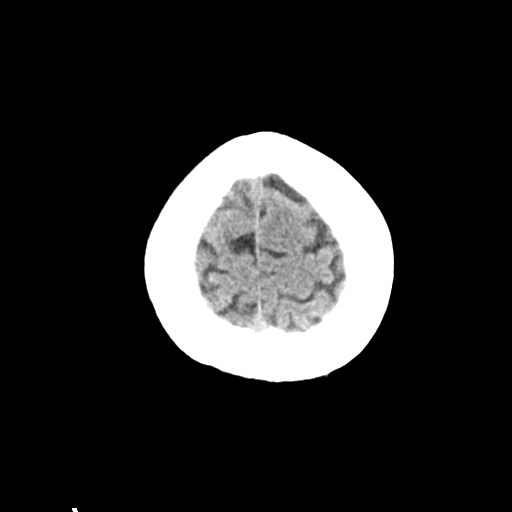
[im 28/30  brain]
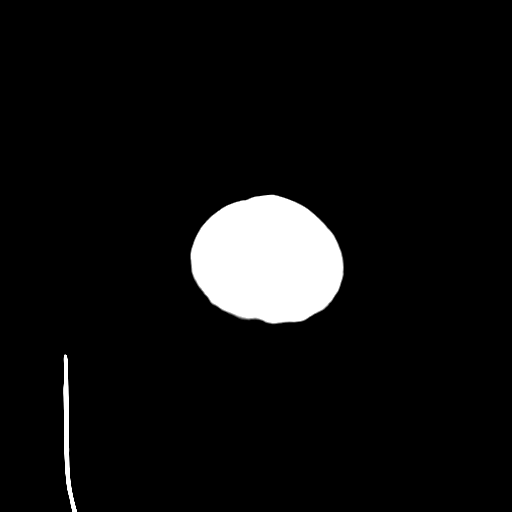

[Series 4: coronal soft tissue · coronal · 0.30mm/px · 3 of 59 slices shown]
[im 20/59  brain]
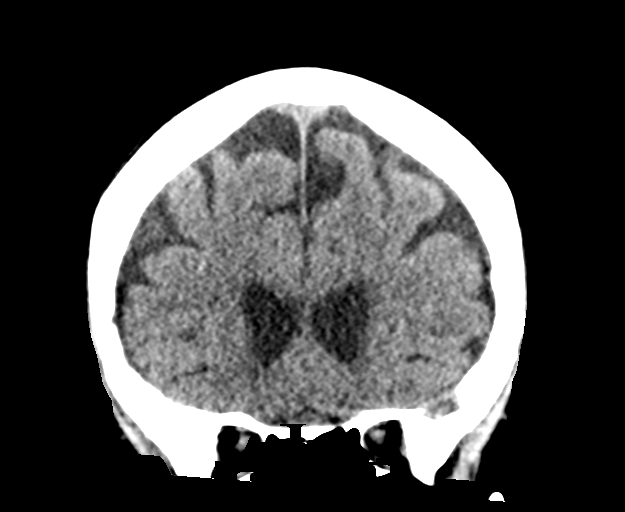
[im 26/59  brain]
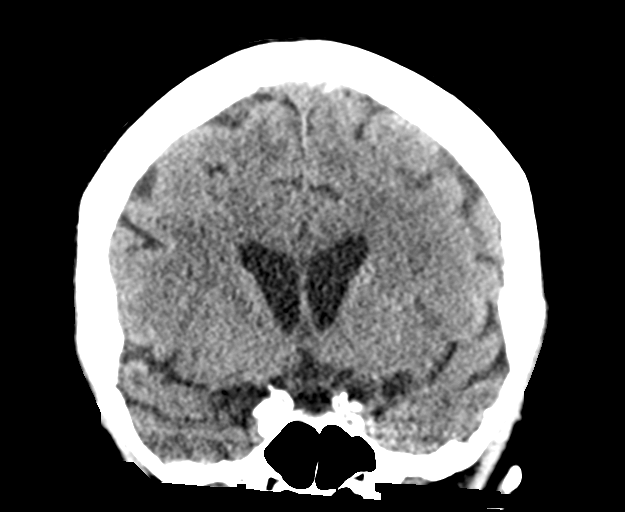
[im 33/59  brain]
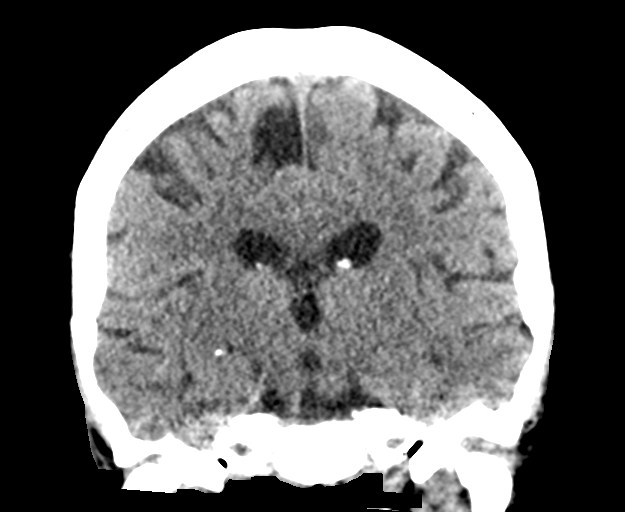

[Series 5: sagittal soft tissue · sagittal · 0.31mm/px · 3 of 49 slices shown]
[im 17/49  brain]
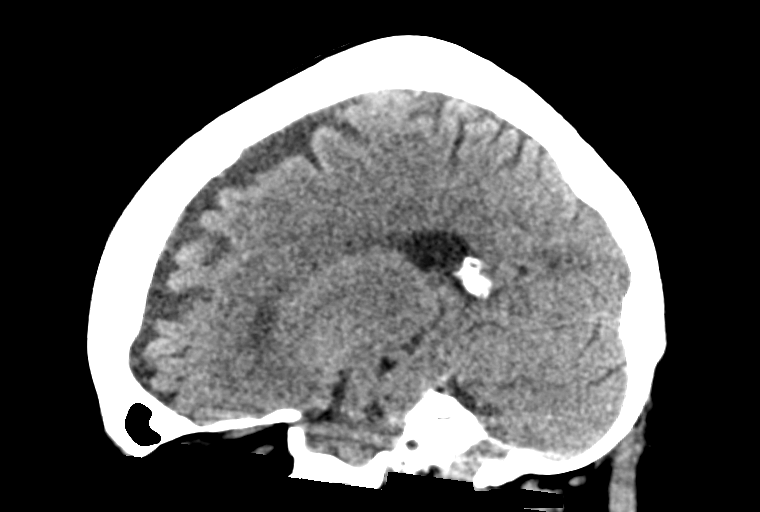
[im 25/49  brain]
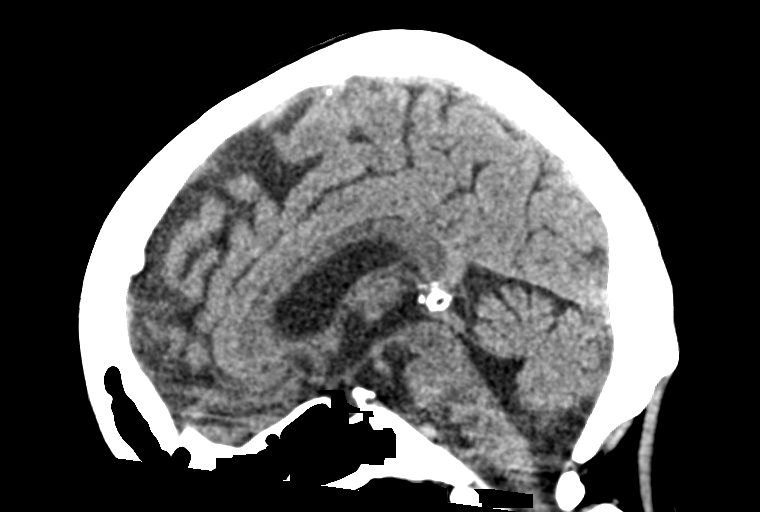
[im 33/49  brain]
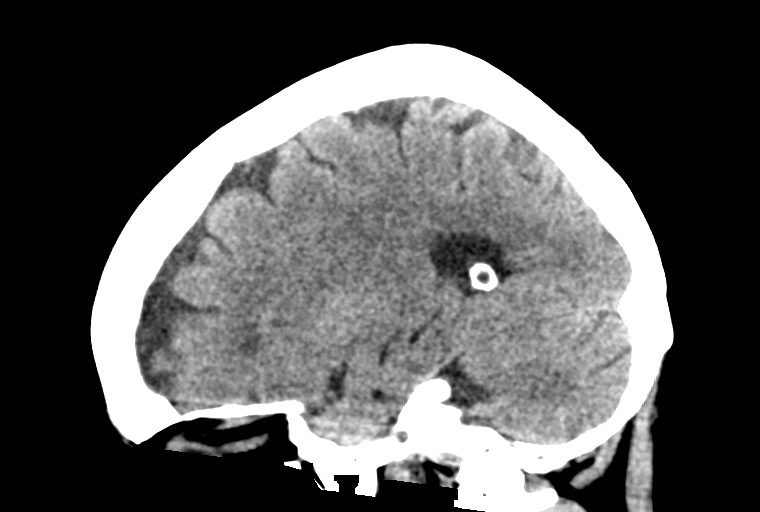

[14 of 47 positions shown; findings below may reference images not displayed]

FINDINGS: Brain: No evidence of acute infarction, hemorrhage, hydrocephalus,
extra-axial collection or mass lesion/mass effect. Nonspecific white
matter hypodensities are compatible with mild chronic microvascular
ischemic changes. Mild volume loss of the brain for age.

Vascular: Calcific atherosclerosis of the carotid siphons. No
hyperdense vessel identified.

Skull: Normal. Negative for fracture or focal lesion.

Sinuses/Orbits: No acute finding.

Other: None.
IMPRESSION: 1. No acute intracranial abnormality identified.
2. Mild for age chronic microvascular ischemic changes and volume
loss of the brain.

By: Prbruno Wildanger M.D.
